# Patient Record
Sex: Female | Born: 1970 | Race: White | Hispanic: No | Marital: Married
Health system: Midwestern US, Academic
[De-identification: ages and names within clinical notes are randomized; demographics above are authoritative.]

## PROBLEM LIST (undated history)

## (undated) DIAGNOSIS — R928 Other abnormal and inconclusive findings on diagnostic imaging of breast: Secondary | ICD-10-CM

## (undated) DIAGNOSIS — N6459 Other signs and symptoms in breast: Secondary | ICD-10-CM

## (undated) DIAGNOSIS — F32A Depression, unspecified: Secondary | ICD-10-CM

## (undated) DIAGNOSIS — E559 Vitamin D deficiency, unspecified: Secondary | ICD-10-CM

## (undated) DIAGNOSIS — Z1231 Encounter for screening mammogram for malignant neoplasm of breast: Principal | ICD-10-CM

## (undated) DIAGNOSIS — F419 Anxiety disorder, unspecified: Secondary | ICD-10-CM

## (undated) DIAGNOSIS — F39 Unspecified mood [affective] disorder: Secondary | ICD-10-CM

## (undated) DIAGNOSIS — N189 Chronic kidney disease, unspecified: Secondary | ICD-10-CM

## (undated) DIAGNOSIS — C801 Malignant (primary) neoplasm, unspecified: Secondary | ICD-10-CM

## (undated) DIAGNOSIS — R011 Cardiac murmur, unspecified: Secondary | ICD-10-CM

## (undated) DIAGNOSIS — D649 Anemia, unspecified: Secondary | ICD-10-CM

## (undated) DIAGNOSIS — M1990 Unspecified osteoarthritis, unspecified site: Secondary | ICD-10-CM

## (undated) HISTORY — PX: TUBAL LIGATION: SHX77

## (undated) HISTORY — PX: WISDOM TOOTH EXTRACTION: SHX21

## (undated) HISTORY — PX: ROTATOR CUFF REPAIR: SHX139

## (undated) HISTORY — PX: COLONOSCOPY: SHX174

## (undated) HISTORY — DX: Malignant (primary) neoplasm, unspecified: C80.1

---

## 2006-02-25 NOTE — Unmapped (Signed)
Signed by   LinkLogic on 02/25/2006 at 02:20:14  Patient: [facility]  Note: All result statuses are Final unless otherwise noted.    Tests: (1)  (MR)    Order Note:                                        [facility]     PATIENT NAME:   Julie Molina, Julie Molina                   MR #:  [id number]  DATE OF BIRTH:  1970-12-03                         ACCOUNT #:  [id number]  ED PHYSICIAN:   Berneda Rose, M.D.            ROOM #:  PRIMARY:        Darden Dates, M.D.                NURSING UNIT:  ED  REFERRING:      Darden Dates, M.D.                [facility]:  C  DICTATED BY:    Berneda Rose, M.D.            ADMIT DATE:  02/25/2006  VISIT DATE:     02/25/2006                         DISCHARGE DATE:                           EMERGENCY DEPARTMENT DISCHARGE NOTE     CHIEF  COMPLAINT:  Throat swelling.     HISTORY OF PRESENT ILLNESS:    The patient is a 35 year old white female who  was at home when she suddenly developed the sensation that her throat was  swelling and closing off.  It happened about 11:30.  She keeps Benadryl in  the house and she took a swing from a Benadryl liquid bottle.  The symptoms  slowly resolved over the next 10-15 minutes.  This happened to her once two  weeks ago as well.  She did the same thing and again this has resolved.  She  comes in now quite anxious about the event but asymptomatic.  She never  developed any shortness of breath.  Her husband who is here states that she  witnessed the episode and she was very anxious and became somewhat short of  breath again she responded back to normal levels quite quickly.     PAST MEDICAL HISTORY:     1. Depression.  2. Panic attack.     CURRENT MEDICATIONS:     ALLERGIES:     SOCIAL HISTORY:  No tobacco, alcohol or drugs.     REVIEW OF SYSTEMS:  No fevers, chills, nausea, vomiting, diaphoresis,  abdominal pain, chest pain, shortness of breath sounds.     PHYSICAL EXAMINATION:     VITAL SIGNS:  Blood pressure 141/88, temperature 98.0, pulse 88,  respiration  is 18, pulse ox 97% on room air.  GENERAL:  Well-developed, well-nourished adult in no apparent distress.  HEENT:  Normocephalic and atraumatic.  External ocular muscles are intact.  Pupils equal, round and reactive to  light.  Membranes are clear.  NECK:  Supple with full range of motion.  PULMONARY:  Clear to auscultation bilaterally.  CARDIAC:  Regular rate and rhythm.  Normal S1 and S2, without murmurs,  gallops or rubs.  ABDOMEN:  Soft, nontender, nondistended.  Positive bowel sounds in all four  quadrants.  No rebound or guarding.  EXTREMITIES:  With no rash, with full strength.  Neurovascularly intact with  no neurologic abnormalities.     X-RAY:  Soft tissue neck film is normal.     MEDICAL DECISION MAKING:  It is difficult to know whether the patient is  having panic attacks that are very brief or allergic reactions. Either way  the sedation effects of the Benadryl may be helping her.  She certainly  describes an allergic reaction, although have her keep her Benadryl around  the house. I will put her on a  five day burst of prednisone.  Follow up with  Dr.  Lamar Sprinkles on Monday morning.     DIAGNOSIS:     1.  Allergic reaction versus panic attack.                                                       ________________________________________  SWW/fh                                ____  D:  02/25/2006 01:51                  Berneda Rose, M.D.  T:  02/25/2006 02:14  Job #:  [id number]                              EMERGENCY DEPARTMENT DISCHARGE NOTE                                        COPY                   PAGE    1 of 1    Note: An exclamation mark (!) indicates a result that was not dispersed into   the flowsheet.  Document Creation Date: 02/25/2006 2:20 AM  _______________________________________________________________________    (1) Order result status: Final  Collection or observation date-time: 02/25/2006 00:00  Requested date-time:   Receipt date-time:   Reported date-time:   Referring  Physician: Dema Severin  Ordering Physician:  Reviewed In [facility])  Specimen Source:   Source: DBS  Filler Order Number: [id number]  Lab site:

## 2007-02-20 NOTE — Unmapped (Signed)
Signed by Alvera Novel MA on 02/20/2007 at 11:34:19    Clinical Lists Changes    Orders:  Added new Test order of Cytology, Pap / HPV Reflex  (ThinPrep) ([id number]) ([id number]) - Signed

## 2007-02-20 NOTE — Unmapped (Signed)
Signed by Clovis Pu Carrissa Taitano MD on 02/20/2007 at 11:17:32    History of Present Illness   Chief Complaint: Annual  Age of First Menses: 10  LMP: 01/25/2007  History of Normal Pap Smears: Yes  Last Pap: Neg (04/05/2006 12:00:00 AM)  G: 6  P: 3  SAB: 3  # Living Children: 3    Current Birth Control: VAS  Current Hormone Therapy: no    Annual Visit   Character: normal  Duration of flow: 5-6 day(s)  Interval: 30 days    Current Problems   Patient complains of: none  Patient denies: abnormal bleeding, breast problems, dysmenorrhea, pelvic pain, vaginal discharge, vulvar pain, sexual problems    Currently Sexually Active: Yes  Calcium Supplement: No  Vitamin D Supplement: No  Multivitamin: No  Family History of Osteoporosis: No  Exercise: Yes  Type: Walk   Minutes per Session: 30-45 minutes  Times per Week: x,s 3  Has Patient been threatened, abused or hurt by anyone? No    Allergies  No Known Allergies    Medications:   LEXAPRO 20 MG TABS (ESCITALOPRAM OXALATE) 1 by mouth qd  VERAMYST 27.5 MCG/SPRAY SUSP (FLUTICASONE FUROATE) 2 puffs in each nostril qd        Vital Signs    Weight: 191 lbs.     Blood Pressure   BP #1: 108 / 66mm Hg     Intake recorded by: Alvera Novel MA  February 20, 2007 10:46 AM      Past History  Past Medical History:  Onset of First Menses at Age: 80, Gravida: 6, Para: 3, Miscarriage(s): 3, # Vaginal Deliveries: 2 VBAC, Anxiety, Depression  Surgical History:  R Ear- Bone Spur removed 1998, Cesarean Section: 1997    Family History: Mother - Lung Cancer, CHF, DIED 07-11-2022 SEPSIS FROM INFECTED HEART VALVE  Brother - DIED 11/09/22 SUBSTANCE ABUSE, END STAGE CIRRHOSIS  MGM - Heart Disease  MGF - Heart Disease  Social History: Marital Status: married 15 years,   Children: 4,   Employment Status: homemaker,   Patient Lives at: home,   Support System: excellent  Exercise: Walk 30-45 minutes 3 x's a week.,   Caffeine per Day: 4  Seatbelt Use: 100 % of the time  Alcohol Use: none  Drug Use: none  MOTHER DIED Jul 11, 2022  SEPSIS  BROTHER DIED 01/09/23 SUBSTANCE ABUSE  FATHER IN LAW DIED 01-09-2023  Sexually Active: Yes        PHYSICAL EXAMINATION  Constitutional: alert, no acute distress, well hydrated, well developed, well nourished;    External Genitalia:  no lesions, normal estrogen effect, normal hair pattern, no clitorimegaly, no erythema, normal perineal body;    Urethra:  normal appearance, no erythema, no tenderness, normal urethral meatus;    Vagina:  no discharge, no lesions, no masses, no cystocele, no rectocele, normal support, normal rugations;    Cervix:  present, no lesions, no discharge, no cervical motion tenderness;    Uterus:  uterus midline, normal size, non-tender, regular contour, mobile;    Adnexa:  no masses, no tenderness;          Assessment   WELL WOMAN EXAM  NO COMPLATINS. NORMAL CYCLES      Plan   1-PAP  2-RTN 1 YR  Problems  ANNUAL GYNECOLOGICAL EXAMINATION (ICD-V72.31)                     ]

## 2007-10-08 ENCOUNTER — Ambulatory Visit: Payer: Self-pay | Admitting: Internal Medicine

## 2007-10-16 ENCOUNTER — Ambulatory Visit: Payer: Self-pay | Admitting: Internal Medicine

## 2008-02-26 NOTE — Unmapped (Signed)
Signed by Clovis Pu Kamauri Kathol MD on 03/03/2008 at 17:38:41  Patient: Julie Molina  Note: All result statuses are Final unless otherwise noted.  Patient Note: [id number]  Patient Note: Source............Marland KitchenEndocervical  Patient Note: LMP / Prev Treat.Marland KitchenMarland KitchenAYT=[id number]  Patient Note: No. of containers.Marland Kitchen01 CYTYC Thin Prep Vial  Patient Note: Clinical Information: Cervical/ Endocervical / V aginal/   Supracervical/Hyst erectomy    Tests: (1) Pap IG, rfx HPV ASCU ([id number])  ! Clinician provided ICD9:                              See report      V72.31 ; Routine gynecological examination            PERFORMED BY: Modena Jansky, Cytotechnologist (ASCP)          Interpretation            NIL      NIL      NEGATIVE FOR INTRAEPITHELIAL LESION AND MALIGNANCY.        ! DIAGNOSIS/CATEGORY        NIL      NIL      Negative for Intraepithelial Lesion        ! Adequacy:                 ENDO,PCOIR      ENDO,PCOIR      Satisfactory for evaluation.  Endocervical and/or squamous metaplastic      cells (endocervical component) are present.      Areas of partially obscuring exudate are present.        ! Note:                     PAPSMR      The Pap smear is a screening test designed to aid in the detection of      premalignant and malignant conditions of the uterine cervix.  It is not a      diagnostic procedure and should not be used as the sole means of detecting      cervical cancer.  Both false-positive and false-negative reports do occur.        ! Test Methodology:         IGLPAP      This liquid based ThinPrep(R) pap test was screened with the      use of an image guided system.            The HPV DNA reflex criteria were not met with this specimen result      therefore, no HPV testing was performed.          Note: An exclamation mark (!) indicates a result that was not dispersed into   the flowsheet.  Document Creation Date: 02/29/2008 2:23 PM  _______________________________________________________________________    (1) Order result  status: Final  Collection or observation date-time: 02/26/2008 12:09  Requested date-time:   Receipt date-time: 02/26/2008 18:35  Reported date-time: 02/29/2008 14:11  Referring Physician:    Ordering Physician: T Darcey Cardy (dehoopta)  Specimen Source:   Source: Leverne Humbles Order Number: [id number] LAB  Lab site: Performed At:  [facility] [address] [place],   [place] [id number] A.Clearnce Sorrel MD     Phone:  [phone]

## 2008-02-26 NOTE — Unmapped (Signed)
Signed by Clovis Pu Najai Waszak MD on 02/26/2008 at 11:22:06      Reason for Visit   Chief Complaint: Annual Exam  History from: patient    Allergies  No Known Allergies  Vital Signs Height: 62.5 in.    Weight: 194 lbs.     BMI (in-lb): 35.04   BSA (m2): 1.90    Blood Pressure   BP #1: 132 / 80mm Hg  Cuff Size: Std   Intake recorded by: Percell Locus MA on February 26, 2008 10:30 AM               Annual Visit   Duration of flow: 5 day(s)  Interval: 28 days    Current Problems   Patient denies: abnormal bleeding, breast problems, dysmenorrhea, pelvic pain, vaginal discharge, vulvar pain, sexual problems    Currently Sexually Active: Yes  Partners in Last 12 Months: 1  Calcium Supplement: No  Vitamin D Supplement: No  Multivitamin: No  History of DVT's/VTE's: No  Family History of Osteoporosis: Yes - mother  Exercise: Yes  Type: walk   Minutes per Session: 30  Times per Week: 3  Has Patient been threatened, abused or hurt by anyone? No  History of Present Illness   Chief Complaint: Annual Exam  Age of First Menses: 10  LMP: 02/08/2008    Last Pap: Neg (02/20/2007 12:00:00 AM)  G: 6  P 3:     # Living Children: 3  SAB: 3  Current Birth Control: VAS  Current Hormone Therapy: no    Past History  Past Medical History (reviewed - no changes required):  Onset of First Menses at Age: 1, Gravida: 6, Para: 3, Miscarriage(s): 3, # Vaginal Deliveries: 2 VBAC, Anxiety, Depression  Surgical History (reviewed - no changes required):  R Ear- Bone Spur removed 1998, Cesarean Section: 1997    Family History (reviewed - no changes required): Mother - Lung Cancer, CHF, DIED 07/12/2022 SEPSIS FROM INFECTED HEART VALVE  Brother - DIED 2022/11/10 SUBSTANCE ABUSE, END STAGE CIRRHOSIS  MGM - Heart Disease  MGF - Heart Disease  Social History (reviewed - no changes required): Marital Status: married 15 years,   Children: 4,   Employment Status: homemaker,   Patient Lives at: home,   Support System: excellent  Exercise: Walk 30-45 minutes 3 x's a week.,    Caffeine per Day: 4  Seatbelt Use: 100 % of the time  Alcohol Use: none  Drug Use: none  MOTHER DIED 2022-07-12 SEPSIS  BROTHER DIED 01/10/23 SUBSTANCE ABUSE  FATHER IN LAW DIED Jan 10, 2023  Sexually Active: Yes        Preventive Maintenance           Physical Examination  Constitutional: alert, no acute distress, well hydrated, well developed, well nourished;    Breast:  breast symmetrical, no tenderness, no dimpling, no masses, no erythema, no axillary adenopathy, normal aerola bilaterally, no nipple discharge, no inversion;    Abdomen: nondistended, nontender, no guarding, no masses, no organomegaly, no abdominal hernias, no suprapubic tenderness;    External Genitalia:  no lesions, normal estrogen effect, normal hair pattern, no clitorimegaly, no erythema, normal perineal body;    Urethra:  normal appearance, no erythema, no tenderness, normal urethral meatus;    Vagina:  no discharge, no lesions, no masses, no cystocele, no rectocele, normal support, normal rugations;    Cervix:  present, no lesions, no discharge, no cervical motion tenderness;    Uterus:  uterus midline, normal size, non-tender,  regular contour, anteverted, mobile;    Adnexa:  no masses, no tenderness, non palpable ovaries;        Assessment   37 year old Female     G: 6  P 3:     # Living Children: 3  SAB: 3    Status of Existing Problems  Assessed ANNUAL GYNECOLOGICAL EXAMINATION as unchanged - Normal cycles.  No gyn complaints.    1- Pap.  Pt instructed to call the [facility] Pap Test Patient Information Service for results.  Informed we could call for any abnormal results.  2- Return 1 year - Clovis Pu Tammra Pressman MD

## 2008-02-26 NOTE — Unmapped (Signed)
Signed by Percell Locus MA on 02/26/2008 at 12:11:09    Clinical Lists Changes    Orders:  Added new Test order of Cytology, Pap / HPV Reflex  (ThinPrep) ([id number]) ([id number]) - Signed      Process Orders  Check Orders Results:      EMR Link Lab: ABN not required for this insurance  Requisition page opened for [facility]. Order transmitted February 26, 2008 12:09 PM    Tests Sent for requisitioning:      02/26/2008: EMR Link Lab -- Cytology, Pap / HPV Reflex  (ThinPrep) [phone]) [[id number]] (signed)

## 2008-06-20 ENCOUNTER — Observation Stay: Payer: Self-pay

## 2008-06-26 ENCOUNTER — Inpatient Hospital Stay: Payer: Self-pay

## 2009-03-24 NOTE — Unmapped (Signed)
Signed by Clovis Pu Kwinton Maahs MD on 03/31/2009 at 14:33:19  Patient: Julie Molina  Note: All result statuses are Final unless otherwise noted.    Tests: (1) Pap (TP) Screen - Image Guided, Reflex and HPV Ascus ([id number])  ! Path-Nature of Specimen                              NOTES      .  SPECIMEN(S): Cervical / Endocervical  ! Path-Clinical History                              NOTES      .  CLINICAL HISTORY: Previous Pap showing Office Info ICD-V72.31, PREVIOUS PAP  NORMAL, LMP 03/18/09    Path-Diagnosis            Neg      .  INTERPRETATION:  NEGATIVE FOR INTRAEPITHELIAL LESION OR MALIGNANCY  ! Path-Diagnosis            NOTES      .  SPECIMEN ADEQUACY:  Satisfactory for evaluation with endocervical/transformation zone component  present  ! Path-Diagnosis            NOTES      .  NOTES:  This liquid based pap test was evaluated with the assistance of the  ThinPrep Pap Test Imaging System.  The HPV DNA reflex criteria were not met with this specimen result  therefore, no HPV testing was performed.  ! Path-Diagnosis            NOTES      .  Performing Location:  [address] [place], [place] [address]  ! SIGNATURE                 NOTES      .  Diagnostician:  CHARLENE HINES  Cytotechnologist  Electronically Signed 03/27/2009  ! PRIOR PAP                 NOTES      .  PRIOR PAP SMEAR DIAGNOSES: Only the 3 most recent reports are included.  Fewer reports may be available for some patients.  No cases found.    Note: An exclamation mark (!) indicates a result that was not dispersed into   the flowsheet.  Document Creation Date: 03/27/2009 11:22 AM  _______________________________________________________________________    (1) Order result status: Final  Collection or observation date-time: 03/24/2009 00:00  Requested date-time: 03/24/2009 00:00  Receipt date-time: 03/24/2009 00:00  Reported date-time:   Referring Physician:    Ordering Physician:  Thalia Bloodgood, (dehoopta)  Specimen Source:   Source: Leverne Humbles Order Number:  [id number])  Lab site:

## 2009-03-24 NOTE — Unmapped (Signed)
Signed by Percell Locus MA on 03/24/2009 at 10:01:47    Clinical Lists Changes    Orders:  Added new Test order of Cytology, Pap / HPV Reflex  (ThinPrep) ([id number]) ([id number]) - Signed      Process Orders  Check Orders Results:      EMR Link Lab: ABN not required for this insurance  Not all tests in this order have been sent for requisitioning  Pending tests not yet sent for requisitioning:      03/24/2009: EMR Link Lab -- Cytology, Pap / HPV Reflex  (ThinPrep) ([id number]) [[id number]] (signed)

## 2009-03-24 NOTE — Unmapped (Signed)
Signed by Julie Pu Jamas Jaquay MD on 03/24/2009 at 09:57:54      Reason for Visit   Chief Complaint: Annual Exam  History from: patient    Allergies  No Known Allergies    Vital Signs  Height: 62.5 in.   Weight: 195 lbs.     BMI (in-lb): 35.22   BSA (m2): 1.90    Blood Pressure   BP #1: 120 / 80mm Hg  Cuff Size: Std   Intake recorded by: Percell Locus MA on March 24, 2009 9:08 AM               Annual Visit   Duration of flow: 3a day(s)  Interval: monthly    Current Problems   Patient denies: abnormal bleeding, breast problems, dysmenorrhea, pelvic pain, vaginal discharge, vulvar pain, sexual problems    Currently Sexually Active: Yes  Sexual Partners are: Men  Calcium Supplement: No  Vitamin D Supplement: No  Multivitamin: No  History of DVT's/VTE's: No  Family History of Osteoporosis: Yes - mother  Exercise: No  Has Patient been threatened, abused or hurt by anyone? No  History of Present Illness   Chief Complaint: Annual Exam  Age of First Menses: 10  LMP: 03/18/2009    Last Pap: NIL (02/26/2008 12:09:00 PM)  Mammogram: Normal   Date: 03/13/2000  G: 6  P 3:     # Living Children: 3  SAB: 3  Current Birth Control: VAS  Current Hormone Therapy: no    PAST HISTORY  Past Medical History (reviewed - no changes required):  Onset of First Menses at Age: 69, Gravida: 6, Para: 3, Miscarriage(s): 3, # Vaginal Deliveries: 2 VBAC, Anxiety, Depression  Surgical History (reviewed - no changes required):  R Ear- Bone Spur removed 1998, Cesarean Section: 1997    Family History (reviewed - no changes required): Mother - Lung Cancer, CHF, DIED Jul 12, 2022 SEPSIS FROM INFECTED HEART VALVE  Brother - DIED 2022-11-10 SUBSTANCE ABUSE, END STAGE CIRRHOSIS  MGM - Heart Disease  MGF - Heart Disease  Social History (reviewed - no changes required): Marital Status: married 15 years,   Children: 4,   Employment Status: homemaker,   Patient Lives at: home,   Support System: excellent  Exercise: Walk 30-45 minutes 3 x's a week.,   Caffeine per Day: 4  Seatbelt  Use: 100 % of the time  Alcohol Use: none  Drug Use: none  MOTHER DIED 2022/07/12 SEPSIS  BROTHER DIED 01-10-23 SUBSTANCE ABUSE  FATHER IN LAW DIED Jan 10, 2023  Sexually Active: Yes        Preventive Maintenance           Physical Examination  Constitutional: alert, no acute distress, well hydrated, well developed, well nourished;    Breast:  breast symmetrical, no tenderness, no dimpling, no masses, no erythema, no axillary adenopathy, normal aerola bilaterally, no nipple discharge, no inversion;    Abdomen: nondistended, nontender, no guarding, no masses, no organomegaly, no abdominal hernias, no suprapubic tenderness;    External Genitalia:  no lesions, normal estrogen effect, normal hair pattern, no clitorimegaly, no erythema, normal perineal body;    Urethra:  normal appearance, no erythema, no tenderness, normal urethral meatus;    Vagina:  no discharge, no lesions, no masses, no cystocele, no rectocele, normal support, normal rugations;    Cervix:  present, no lesions, no discharge, no cervical motion tenderness;    Uterus:  uterus midline, normal size, non-tender, regular contour, mobile;  exam limited by body  habitus or patient cooperation  .    Adnexa:  no masses, no tenderness;        Assessment   38 year old Female     G: 6  P 3:     # Living Children: 3  SAB: 3    Status of Existing Problems  Assessed ANNUAL GYNECOLOGICAL EXAMINATION as unchanged - normal exam.  Normal cycles without break through bleeding.  No other gyn complains.    1- Pap - Julie Pu Jelina Paulsen MD

## 2009-03-31 NOTE — Unmapped (Addendum)
Signed by Berna Spare MD on 03/31/2009 at 14:34:19             [facility]                   [facility]     [facility]  [address], [address]    [address], [address]  [place], [place] [address]      [place], [place] [address]  [phone]       [phone]  [phone] Fax      [phone] Fax        March 31, 2009        [facility]  [address]    [place], [place] [address]          RE:  TEST RESULTS    Dear Ms. Loud:      The following is an interpretation of your most recent tests.  Please take note of any instructions provided.         Your recent Pap smear result has come back and is normal.      If you have any questions, please don't hesitate to call the office.  Please remember to make an appointment this time next year for your annual exam.    Good to see you again and hear about the kids.  Kiss each one for me.      Sincerely,      Berna Spare, MD, FACOG  Associate Professor    Signed by Percell Locus MA on 04/01/2009 at 08:09:31    letter mailed  Signed by Percell Locus MA on 04/01/2009 at 08:15:01    letter mailed

## 2009-04-06 ENCOUNTER — Ambulatory Visit: Payer: Self-pay | Admitting: Internal Medicine

## 2009-09-22 ENCOUNTER — Ambulatory Visit: Payer: Self-pay | Admitting: Internal Medicine

## 2009-12-01 ENCOUNTER — Ambulatory Visit: Payer: Self-pay | Admitting: Unknown Physician Specialty

## 2010-03-30 NOTE — Unmapped (Signed)
Signed by Ella Jubilee MD on 03/30/2010 at 09:46:01      Reason for Visit   Chief Complaint: annual  History from: patient    Allergies  No Known Allergies    Medications  LEXAPRO 20 MG TABS (ESCITALOPRAM OXALATE) 1/2 by mouth         Vital Signs  Height: 62.5 in.   Weight: 193 lbs.     BMI (in-lb): 34.86     Blood Pressure   BP #1: 118 / 76mm Hg  Cuff Size: Std     Pain:     Have you had pain other than everyday aches and pains (e.g., mild headache, back ache, strains) in the past week?   No    Intake recorded by: Sharmon Revere MA on March 30, 2010 8:54 AM             Medications reviewed, updated and verified with patient or patient representative.  Women (Any Age) or Men (over Age 65): nondrinker      Annual Visit   Character: normal  Duration of flow: 6 day(s)  Interval: 28 days    Current Problems   Patient denies: abnormal bleeding, breast problems, dysmenorrhea, pelvic pain, vaginal discharge, vulvar pain, sexual problems    Currently Sexually Active: Yes  Sexual Partners are: Men  Calcium Supplement: No  Vitamin D Supplement: No  Multivitamin: No  History of DVT's/VTE's: No  Family History of Osteoporosis: Yes - mother  Exercise: No  Has Patient been threatened, abused or hurt by anyone? No  History of Present Illness   Chief Complaint: annual  Age of First Menses: 10  LMP: 03/06/2010  History of Abnormal Paps: No  Last Pap: Neg (03/24/2009 12:00:00 AM)  G: 6  P 3:     # Living Children: 3  SAB: 3  Current Birth Control: VAS  Current Hormone Therapy: no    39yo here for annual exam  patient without complaints        PAST HISTORY  Past Medical History (reviewed - no changes required):  Onset of First Menses at Age: 19, Gravida: 6, Para: 3, Miscarriage(s): 3, # Vaginal Deliveries: 2 VBAC, Anxiety, Depression  Surgical History (reviewed - no changes required):  R Ear- Bone Spur removed 1998, Cesarean Section: 1997    Family History (reviewed - no changes required): Mother - Lung Cancer, CHF, DIED 2022-06-22  SEPSIS FROM INFECTED HEART VALVE  Brother - DIED Oct 21, 2022 SUBSTANCE ABUSE, END STAGE CIRRHOSIS  MGM - Heart Disease  MGF - Heart Disease  Social History (reviewed - no changes required): Marital Status: married 15 years,   Children: 4,   Employment Status: homemaker,   Patient Lives at: home,   Support System: excellent  Exercise: Walk 30-45 minutes 3 x's a week.,   Caffeine per Day: 4  Seatbelt Use: 100 % of the time  Alcohol Use: none  Drug Use: none  MOTHER DIED Jun 22, 2022 SEPSIS  BROTHER DIED 12-21-22 SUBSTANCE ABUSE  FATHER IN LAW DIED 21-Dec-2022  Sexually Active: Yes        Preventive Maintenance         Review of Systems  General: Denies fevers, chills, sweats, anorexia, fatigue, malaise, weight loss, weight gain.   Cardiovascular: Denies chest pains, dizziness, dyspnea on exertion, dyspnea at rest, palpitations.   Gastrointestinal: Denies nausea, vomiting, diarrhea, constipation, change in bowel habits, abdominal pain.   Genitourinary: Denies vaginal discharge, incontinence, day time wetting, dysuria, hematuria, urinary frequency, amenorrhea, menorrhagia,  abnormal vaginal bleeding, pelvic pain, nocturia, flank pain, weak stream, precipitance, incomplete empty, retention, enuresis, perineal rash, tea-colored urine.   Neurologic: Denies headache, transient paralysis, weakness, paresthesias, seizures, syncope, tremors, vertigo, poor coordination.     Physical Examination  Constitutional: alert, no acute distress, well hydrated, well developed, well nourished;    Skin: normal color, no rashes, no lesions, warm to touch;    Head: atraumatic, normocephalic;    Eyes: pupils equal, no injection, no icterus;    Ears/Nose/Throat: external ears normal, hearing normal, external nose normal;    Neck: supple, no adenopathy, no masses, no thyromegaly;    Chest: no apparent respiratory distress, normal chest inspection, clear to auscultation, normal breath sounds bilaterally;    Breast:  breast symmetrical, no tenderness, no dimpling, no masses,  no erythema, no axillary adenopathy, normal aerola bilaterally, no nipple discharge, no inversion;    Cardiac:  normal S1 and S2, regular rate and rhythm, no murmurs, no gallops, no rubs;    Abdomen: nondistended, nontender, no guarding, no masses, no organomegaly, no abdominal hernias, no suprapubic tenderness;    External Genitalia:  no lesions, normal estrogen effect, normal hair pattern, no clitorimegaly, no erythema, normal perineal body;    Urethra:  normal appearance, no erythema, no tenderness, normal urethral meatus;    Vagina:  no discharge, no lesions, no masses, no cystocele, no rectocele, normal support, normal rugations;    Cervix:  present, no lesions, no discharge, no cervical motion tenderness, Nabothian cyst(s);    Uterus:  uterus midline, normal size, non-tender, regular contour, mobile;    Adnexa:  no masses, no tenderness;    Lymphatic: no cervical adenopathy, no supraclavicular adenopathy, no axillary adenopathy, no inguinal adenopathy, no femoral adenopathy, no enlargement;    Back: no deformities, normal spine, no cva tenderness;    Extremities: no edema, no lesions;    Neuro: normal mental status;    Psych: oriented to time; place; and person, affect and mood appropriate;        Assessment   39 year old Female     G: 6  P 3:     # Living Children: 3  SAB: 3    Status of Existing Problems  Assessed ANNUAL GYNECOLOGICAL EXAMINATION as unchanged - updated  mmg  pap - Ella Jubilee MD    Today's Orders   Mammogram, Screening Bilateral [[id number]-76092]  Cytology, Pap / HPV Reflex / GC / Chlamydia (ThinPrep) (81191) [[id number]]    Assessment  39yo P3 with annual exam    Plan   pap  mmg    Disposition:     Clinic: [facility]                Process Orders  Check Orders Results:      EMR Link Lab: ABN not required for this insurance  Tests Sent for requisitioning (March 30, 2010 9:46 AM):      03/30/2010: EMR Link [facility] -- Cytology, Pap / HPV Reflex / GC / Chlamydia (ThinPrep) ([id number]) [[id number]] (signed)    ]

## 2010-04-05 NOTE — Unmapped (Addendum)
Signed by Ella Jubilee MD on 04/05/2010 at 15:56:35                 [facility]         Obstetrics and Gynecology          [address]         [place], [place] [address]         p [phone] f [phone]         [email or web address]        April 05, 2010        [facility]  [address]    [place], [place] [address]      RE:  TEST RESULTS    Dear Ms. Berrett:      The following is an interpretation of your most recent tests.  Please take note of any instructions provided.    Pap Smear: normal       GC/Chlamydia results negative.      It was great to meet you the other day.  Thanks for coming to see me.  If you have any questions or need anything else, please call.        Sincerely,        Deirdre Peer. Rosanne Gutting, MD  Assistant Professor    Signed by Sharmon Revere MA on 04/06/2010 at 09:11:10    mailed out to patient

## 2010-10-04 NOTE — Unmapped (Signed)
Signed by Sharmon Revere MA on 10/04/2010 at 15:20:39                 [facility]         Obstetrics and Gynecology          [address]         [place], [place] [address]         p [phone] f [phone]         [email or web address]        October 04, 2010      [facility]  [address]  [place], [place]  [address]      Dear  Ms. Julie Molina,    This letter is a reminder that you are PAST DUE for routine tests (mammogram ) that were ordered by your doctor.  If you have already had these tests performed and/or have not received the results, Please contact our office.    Please call if you have any questions.        [name] MA / Dr. Rosanne Gutting

## 2010-11-24 NOTE — Unmapped (Signed)
Signed by Sharmon Revere MA on 11/24/2010 at 10:40:31                 [facility]         Obstetrics and Gynecology          [address]         [place], [place] [address]         p [phone] f [phone]         [email or web address]        November 24, 2010      [facility]  [address]  [place], [place]  [address]      Dear  Ms. Julie Molina,    This letter is a reminder that you are PAST DUE for routine tests(mammogram) that were ordered by your doctor.  If you have already had these tests performed and/or have not received the results, Please contact our office.    Please call if you have any questions.      [name] MA

## 2011-03-28 ENCOUNTER — Encounter: Payer: Self-pay | Admitting: Rheumatology

## 2011-04-14 ENCOUNTER — Encounter: Payer: Self-pay | Admitting: Rheumatology

## 2011-06-02 ENCOUNTER — Ambulatory Visit: Payer: Self-pay | Admitting: Internal Medicine

## 2011-07-15 ENCOUNTER — Encounter

## 2011-08-24 MED ORDER — MOMETASONE FUROATE 50 MCG/ACT NA SUSP
50 MCG/ACT | Freq: Every day | NASAL | Status: DC
Start: 2011-08-24 — End: 2012-07-25

## 2011-08-24 NOTE — Progress Notes (Signed)
[facility] Internal [facility] Progress Note    HPI: Bonnie Mitchell is a 41 y.o. year old female.  she is here following up for:       Reason For Visit:  Chief Complaint   Patient presents with   . 6 Month Follow-Up     follow up from last visit       Review of Systems :    Constitutional: Negative.    HENT: Negative for hearing loss, ear pain and neck pain.    Cardiovascular: Negative.    Gastrointestinal: Negative.    Genitourinary: Negative.    Neurological: Negative.    Hematological: Negative.    All other systems reviewed and are negative.    Past Medical History   Diagnosis Date   . Depression    . Hypercholesteremia    . Adjustment disorder 08/24/2011     Foster child sent to [facility] w/ derelict step grandfather 11/12   . 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 Annie Main MD Derm   . GERD (gastroesophageal reflux disease) 08/24/2011     History   Substance Use Topics   . Smoking status: Never Smoker    . Smokeless tobacco: Not on file   . Alcohol Use: Not on file       General:     Obese: No    Pain Assessment:    Pain Present: no  Diet: Yes  Exercise: Yes      Current Outpatient Prescriptions   Medication Sig Dispense Refill   . escitalopram (LEXAPRO) 10 MG tablet Take 10 mg by mouth daily.       . Cholecalciferol (VITAMIN D) 2000 UNITS CAPS capsule Take  by mouth.       . calcium carbonate 600 MG TABS tablet Take 1 tablet by mouth 2 times daily.         No current facility-administered medications for this visit.       Physical Exam   Vitals reviewed.  Constitutional: appears well-nourished.   HENT:   Carotid bruits: No  Lymphadenopathy: No adenopathy  Thyroid-No masses or nodules  Eyes: PERRLA, EOMI   LUNGS:  No increased work of breathing, good air exchange, clear to auscultation bilaterally, no crackles or wheezing  CARDIOVASCULAR: regular rate and rhythm, S1, S2 normal, no murmur, click, rub or gallop  ABDOMEN:  soft, non-tender, without masses or organomegaly  Musculoskeletal: No bony deformity  Neurological: No  lateralizing motor deficits  Skin: No suspicious pigmented lesions  Psychiatric: Normal affect and mood  Examined by: Darden Dates, M.D., Stana Bunting, ANP-BC    Plan:  Patient Active Problem List   Diagnosis   . Hypercholesteremia   . Adjustment disorder coping well since 11/12   . Atypical nevi Derm F/U Dr. Annie Main   . GERD (gastroesophageal reflux disease)   . Rhinitis nasal steroids resume & stay on

## 2011-09-02 ENCOUNTER — Encounter

## 2011-09-05 MED ORDER — ESCITALOPRAM OXALATE 20 MG PO TABS
20 MG | ORAL_TABLET | Freq: Every day | ORAL | Status: DC
Start: 2011-09-05 — End: 2012-05-14

## 2011-09-05 NOTE — Telephone Encounter (Signed)
Sent in lexapro rx per Dr. Lamar Sprinkles

## 2011-09-13 ENCOUNTER — Encounter

## 2011-09-21 ENCOUNTER — Ambulatory Visit: Admit: 2011-09-21 | Discharge: 2011-09-21 | Payer: PRIVATE HEALTH INSURANCE

## 2011-09-21 DIAGNOSIS — Z01419 Encounter for gynecological examination (general) (routine) without abnormal findings: Secondary | ICD-10-CM

## 2011-09-21 NOTE — Unmapped (Signed)
Subjective:       Patient ID: Julie Molina is a 41 y.o. female.    HPI Comments: Here for annual exam  No complaints  Pap done 2011  MMG done in Feb and was normal per her report  Menstrual History  Period Cycle (Days): 28   Period Duration (Days): 5  Period Pattern: Regular  Menstrual Flow: Moderate  Dysmenorrhea: None        The following portions of the patient's history were reviewed and updated as appropriate:   She  has a past medical history of Anxiety and Depression.  She  does not have any pertinent problems on file.  She  has past surgical history that includes Cesarean section (1997) and ear spur (1998).  Her family history includes Cancer in her mother and Heart disease in her maternal grandfather and maternal grandmother.  She  reports that she has never smoked. She does not have any smokeless tobacco history on file. She reports that she drinks alcohol. She reports that she does not use illicit drugs.  She has a current medication list which includes the following prescription(s): escitalopram.  No current outpatient prescriptions on file prior to visit.     She  has no known allergies..    Review of Systems   Constitutional: Negative for fever, chills, appetite change, fatigue and unexpected weight change.   Respiratory: Negative for cough, choking, chest tightness, shortness of breath and wheezing.    Cardiovascular: Negative for chest pain, palpitations and leg swelling.   Gastrointestinal: Negative for nausea, vomiting, abdominal pain, diarrhea and constipation.   Genitourinary: Negative for dysuria, frequency, vaginal bleeding, vaginal discharge, difficulty urinating, vaginal pain and pelvic pain.   Neurological: Negative for dizziness, tremors, seizures, numbness and headaches.           Objective:    Physical Exam   Nursing note and vitals reviewed.  Constitutional: She is oriented to person, place, and time. She appears well-developed and well-nourished. No distress.   HENT:   Head:  Normocephalic and atraumatic.   Right Ear: External ear normal.   Left Ear: External ear normal.   Nose: Nose normal.   Eyes: Conjunctivae and EOM are normal. Right eye exhibits no discharge. Left eye exhibits no discharge. No scleral icterus.   Neck: Normal range of motion. Neck supple. No tracheal deviation present. No thyromegaly present.   Cardiovascular: Normal rate and regular rhythm.  Exam reveals no gallop and no friction rub.    No murmur heard.  Pulmonary/Chest: Effort normal and breath sounds normal. No stridor. No respiratory distress. She has no wheezes. She has no rales. She exhibits no tenderness. Right breast exhibits no inverted nipple, no mass, no nipple discharge, no skin change and no tenderness. Left breast exhibits no inverted nipple, no mass, no nipple discharge, no skin change and no tenderness. Breasts are symmetrical.   Abdominal: Soft. Bowel sounds are normal. She exhibits no distension and no mass. There is no tenderness. There is no rebound and no guarding.   Genitourinary: Vagina normal and uterus normal. No breast swelling, tenderness, discharge or bleeding. Pelvic exam was performed with patient supine. No labial fusion. There is no rash, tenderness, lesion or injury on the right labia. There is no rash, tenderness, lesion or injury on the left labia. Uterus is not deviated, not enlarged, not fixed and not tender. Cervix exhibits no motion tenderness, no discharge and no friability. Right adnexum displays no mass, no tenderness and no fullness. Left  adnexum displays no mass, no tenderness and no fullness. No erythema, tenderness or bleeding around the vagina. No foreign body around the vagina. No signs of injury around the vagina. No vaginal discharge found.   Musculoskeletal: Normal range of motion. She exhibits no edema.   Lymphadenopathy:     She has no cervical adenopathy.        Right: No inguinal adenopathy present.        Left: No inguinal adenopathy present.   Neurological:  She is alert and oriented to person, place, and time. Coordination normal.   Skin: Skin is warm and dry. No rash noted. She is not diaphoretic. No erythema.   Psychiatric: She has a normal mood and affect. Her behavior is normal. Judgment and thought content normal.           Assessment:       Normal annual exam      Plan:       1. Routine gynecological examination

## 2012-05-14 MED ORDER — ESCITALOPRAM OXALATE 20 MG PO TABS
20 MG | ORAL_TABLET | Freq: Every day | ORAL | Status: DC
Start: 2012-05-14 — End: 2012-05-31

## 2012-05-31 MED ORDER — ESCITALOPRAM OXALATE 10 MG PO TABS
10 MG | ORAL_TABLET | Freq: Every day | ORAL | Status: DC
Start: 2012-05-31 — End: 2012-05-31

## 2012-05-31 MED ORDER — AZITHROMYCIN 500 MG PO TABS
500 MG | ORAL_TABLET | ORAL | Status: DC
Start: 2012-05-31 — End: 2012-07-25

## 2012-05-31 MED ORDER — LEXAPRO 10 MG PO TABS
10 MG | ORAL_TABLET | Freq: Every day | ORAL | Status: DC
Start: 2012-05-31 — End: 2012-10-05

## 2012-05-31 NOTE — Progress Notes (Signed)
[facility] Internal Medicine Progress Note    HPI: Bonnie Mitchell is a 41 y.o. year old female.  she is here following up for:       Reason For Visit:  Chief Complaint   Patient presents with   ??? 6 Month Follow-Up     for GERD & mood issues w/related medications   ??? Discuss Medications     generic lexapro is causing side effects: throat tightness. Try brand Lexapro 1/2 of a 10mg  tab.   ??? Flu Vaccine     would like to receive today, has low grade temp       Review of Systems :    Constitutional: Negative.    HENT: Negative for hearing loss, ear pain and neck pain.    Cardiovascular: Negative.    Gastrointestinal: Negative.    Genitourinary: Negative.    Neurological: Negative.    Hematological: Negative.    All other systems reviewed and are negative.    Past Medical History   Diagnosis Date   ??? Depression 2007   ??? Hypercholesteremia    ??? Adjustment disorder 08/24/2011     Foster child sent to [facility] w/ derelict step grandfather 11/12   ??? 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 Ede MD Derm   ??? GERD (gastroesophageal reflux disease) 08/24/2011   ??? Posterior vitreous detachment, both eyes 2004     Dr. Elisabeth Pigeon   ??? OSA (obstructive sleep apnea) 05/31/2012     History   Substance Use Topics   ??? Smoking status: Never Smoker    ??? Smokeless tobacco: Never Used      Comment: Not needed   ??? Alcohol Use: No       General:     Obese: No    Pain Assessment:    Pain Present: no  Diet: Yes  Exercise: Yes      Current Outpatient Prescriptions   Medication Sig Dispense Refill   ??? LEXAPRO 10 MG tablet Take 1 tablet by mouth daily. For mood.  30 tablet  12   ??? azithromycin (ZITHROMAX) 500 MG tablet 1 daily till gone for sinus/ bronchitis.  6 tablet  2   ??? mometasone (NASONEX) 50 MCG/ACT nasal spray 2 sprays by Nasal route daily. For nasal & ear allergy symptoms.  1 Inhaler  11   ??? Cholecalciferol (VITAMIN D) 2000 UNITS CAPS capsule Take 1 capsule by mouth daily.       ??? calcium carbonate 600 MG TABS tablet Take 1 tablet by mouth 2 times daily.          No current facility-administered medications for this visit.       Physical Exam   Vitals reviewed.  Constitutional: appears well-nourished.   HENT:   Carotid bruits: No  Lymphadenopathy: No adenopathy  Thyroid-No masses or nodules  Eyes: PERRLA, EOMI   LUNGS:  No increased work of breathing, good air exchange, clear to auscultation bilaterally, no crackles or wheezing  CARDIOVASCULAR: regular rate and rhythm, S1, S2 normal, no murmur, click, rub or gallop  ABDOMEN:  soft, non-tender, without masses or organomegaly  Musculoskeletal: No bony deformity  Neurological: No lateralizing motor deficits  Skin: No suspicious pigmented lesions  Psychiatric: Normal affect and mood  Examined by: Darden Dates, M.D., Stana Bunting, ANP-BC    Plan:  Patient Active Problem List   Diagnosis   ??? Hypercholesteremia   ??? Adjustment disorder coping well since 11/12   ??? Atypical nevi Derm F/U  Dr. Annie Main   ??? GERD (gastroesophageal reflux disease)   ??? Rhinitis nasal steroids resume & stay on nasal steroids. Moisturise w/ nasal saline.

## 2012-06-13 HISTORY — PX: THUMB FUSION: SUR636

## 2012-06-14 ENCOUNTER — Ambulatory Visit: Payer: Self-pay

## 2012-07-25 MED ORDER — LEVOFLOXACIN 500 MG PO TABS
500 MG | ORAL_TABLET | Freq: Every day | ORAL | Status: AC
Start: 2012-07-25 — End: 2012-08-04

## 2012-07-25 MED ORDER — MOMETASONE FUROATE 50 MCG/ACT NA SUSP
50 MCG/ACT | Freq: Every day | NASAL | Status: AC
Start: 2012-07-25 — End: ?

## 2012-07-25 NOTE — Progress Notes (Signed)
Subjective:      Patient ID: Bonnie Mitchell is a 42 y.o. female.  Chief Complaint   Patient presents with   ??? Cough     started Sunday, had some old atb (z pack) and she started those    ??? Chest Congestion   ??? Nausea & Vomiting       Cough  This is a new problem. The current episode started in the past 7 days. The problem has been unchanged. The problem occurs every few minutes. The cough is productive of purulent sputum. Associated symptoms include headaches, postnasal drip, rhinorrhea and a sore throat. Pertinent negatives include no chest pain, chills, fever or shortness of breath. The symptoms are aggravated by lying down. Treatments tried: zithromax 500 mg daily started 07/22/12. The treatment provided no relief.   Sputum production is thick and yellow.     Review of Systems   Constitutional: Positive for activity change and fatigue. Negative for fever and chills.   HENT: Positive for congestion, sore throat, rhinorrhea and postnasal drip.    Respiratory: Positive for cough. Negative for chest tightness and shortness of breath.    Cardiovascular: Negative for chest pain.   Gastrointestinal: Positive for nausea. Negative for vomiting, diarrhea and constipation.   Neurological: Positive for headaches. Negative for dizziness, syncope and weakness.   Psychiatric/Behavioral: Negative for confusion.       Objective:   Physical Exam   Nursing note and vitals reviewed.  Constitutional: She is oriented to person, place, and time. She appears well-developed and well-nourished. No distress.   HENT:   Head: Normocephalic and atraumatic.   Right Ear: Tympanic membrane, external ear and ear canal normal. Tympanic membrane is not erythematous.   Left Ear: Tympanic membrane, external ear and ear canal normal. Tympanic membrane is not erythematous.   Mouth/Throat: Posterior oropharyngeal erythema present. No oropharyngeal exudate.   Eyes: EOM are normal.   Neck: Neck supple.   Cardiovascular: Normal rate, regular rhythm and  normal heart sounds.  Exam reveals no gallop and no friction rub.    No murmur heard.  Pulmonary/Chest: Effort normal and breath sounds normal. No respiratory distress.   Musculoskeletal: She exhibits no edema.        Right ankle: She exhibits normal pulse.        Left ankle: She exhibits normal pulse.   Lymphadenopathy:        Head (right side): No submandibular adenopathy present.        Head (left side): No submandibular adenopathy present.   Neurological: She is alert and oriented to person, place, and time.   Skin: Skin is warm and dry.   Psychiatric: She has a normal mood and affect. Her behavior is normal. Judgment and thought content normal.       Assessment:      1. Cough    2. Bronchitis    3. Sinus congestion          Plan:      Allergies   Allergen Reactions   ??? Codeine Itching and Rash       Current Outpatient Prescriptions   Medication Sig Dispense Refill   ??? azithromycin (ZITHROMAX) 500 MG tablet 1 daily till gone for sinus/ bronchitis.  6 tablet  2   ??? LEXAPRO 10 MG tablet Take 1 tablet by mouth daily. For mood. (has not needed)   30 tablet  12   ??? mometasone (NASONEX) 50 MCG/ACT nasal spray 2 sprays by Nasal route daily.  For nasal & ear allergy symptoms.  1 Inhaler  11   ??? Cholecalciferol (VITAMIN D) 2000 UNITS CAPS capsule Take 1 capsule by mouth daily.       ??? calcium carbonate 600 MG TABS tablet Take 1 tablet by mouth 2 times daily.         No current facility-administered medications for this visit.       Filed Vitals:    07/25/12 1052   BP: 100/70   Pulse: 112   Temp: 98.9 ??F (37.2 ??C)   TempSrc: Oral   Resp: 18   Weight: 192 lb (87.091 kg)   SpO2: 98%     Body mass index is 35.11 kg/(m^2).     Wt Readings from Last 3 Encounters:   07/25/12 192 lb (87.091 kg)   05/31/12 188 lb 12.8 oz (85.639 kg)   08/24/11 193 lb (87.544 kg)     BP Readings from Last 3 Encounters:   07/25/12 100/70   05/31/12 124/80   08/24/11 119/72     Lilas was seen today for cough, chest congestion and nausea &  vomiting.    Diagnoses and associated orders for this visit:    Cough/ Bronchitis: should stop the zithromax and start  - levofloxacin (LEVAQUIN) 500 MG tablet; Take 1 tablet by mouth daily for 10 days. For infection  Increase fluids. Delsym PRN cough    Sinus congestion  - mometasone (NASONEX) 50 MCG/ACT nasal spray; 2 sprays by Nasal route daily. For nasal & ear allergy symptoms.  Patient reports that she can get dry mouth with this medication. Discussed that she needs to drink 64 oz of water daily. Can also use OTC dry mouth rinse.    Patient reports that her mood has been good and she has not needed the lexapro.    Past history needs to be more updated at next visit.

## 2012-08-02 ENCOUNTER — Encounter

## 2012-09-04 MED ORDER — PANTOPRAZOLE SODIUM 20 MG PO TBEC
20 MG | ORAL_TABLET | Freq: Every day | ORAL | Status: DC
Start: 2012-09-04 — End: 2012-10-05

## 2012-09-04 NOTE — Progress Notes (Signed)
Subjective:      Patient ID: Bonnie Mitchell is a 42 y.o. female.  Chief Complaint   Patient presents with   ??? Dysphagia     with food, dry mouth    ??? Heartburn   ??? Anxiety   ??? Gastrophageal Reflux       Heartburn  She complains of coughing, dysphagia, globus sensation, heartburn and a sore throat. She reports no abdominal pain, no chest pain or no nausea. This is a new problem. Episode onset: past 4 months, but worse this past month. The problem occurs frequently. The problem has been gradually worsening. The heartburn duration is an hour. The heartburn is located in the substernum. The heartburn is of moderate intensity. The heartburn wakes her from sleep. The heartburn does not limit her activity. The symptoms are aggravated by lying down. Pertinent negatives include no fatigue or muscle weakness. Risk factors include lack of exercise and obesity. She has tried nothing for the symptoms.   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 She reports that she has high anxiety. She just recently restarted her lexapro, but she is taking 5 mg daily. She also reports that she has PND and she is taking the nasonex for her symptoms. This is not really helping. She feels like there is mucus in her throat. She reports that sometimes when she is eating she feels like food is getting stuck.      Review of Systems   Constitutional: Negative for activity change, appetite change and fatigue.   HENT: Positive for sore throat, rhinorrhea, trouble swallowing and postnasal drip.         Positive for dry mouth   Respiratory: Positive for cough. Negative for chest tightness and shortness of breath.    Cardiovascular: Negative for chest pain.   Gastrointestinal: Positive for heartburn and dysphagia. Negative for nausea, vomiting, abdominal pain, diarrhea, constipation and blood in stool.   Musculoskeletal: Negative for muscle weakness.   Neurological: Negative for  dizziness, syncope, weakness and headaches.   Psychiatric/Behavioral: Negative for confusion. The patient is nervous/anxious.        Objective:   Physical Exam   Nursing note and vitals reviewed.  Constitutional: She is oriented to person, place, and time. She appears well-developed and well-nourished. No distress.   HENT:   Head: Normocephalic and atraumatic.   Eyes: EOM are normal.   Neck: Neck supple.   Cardiovascular: Normal rate, regular rhythm and normal heart sounds.  Exam reveals no gallop and no friction rub.    No murmur heard.  Pulmonary/Chest: Effort normal and breath sounds normal. No respiratory distress.   Abdominal: Soft. There is no tenderness.   Musculoskeletal: She exhibits no edema.        Right ankle: She exhibits normal pulse.        Left ankle: She exhibits normal pulse.   Lymphadenopathy:        Head (right side): No submandibular adenopathy present.        Head (left side): No submandibular adenopathy present.   Neurological: She is alert and oriented to person, place, and time.   Skin: Skin is warm and dry.   Psychiatric: She has a normal mood and affect. Her behavior is normal. Judgment and thought content normal.       Assessment:      1. GERD (gastroesophageal reflux disease)    2. Dysphagia    3.  Rhinitis    4. Anxiety          Plan:      Allergies   Allergen Reactions   ??? Codeine Itching and Rash       Current Outpatient Prescriptions   Medication Sig Dispense Refill   ??? vitamin B-12 (CYANOCOBALAMIN) 1000 MCG tablet Take 1,000 mcg by mouth daily.       ??? mometasone (NASONEX) 50 MCG/ACT nasal spray 2 sprays by Nasal route daily. For nasal & ear allergy symptoms.  1 Inhaler  11   ??? LEXAPRO 10 MG tablet Take 1 tablet by mouth daily. For mood. (has only been taking 1/2 tab daily)   30 tablet  12   ??? Cholecalciferol (VITAMIN D) 2000 UNITS CAPS capsule Take 1 capsule by mouth daily.       ??? calcium carbonate 600 MG TABS tablet Take 1 tablet by mouth 2 times daily.         No current  facility-administered medications for this visit.       Filed Vitals:    09/04/12 0906   BP: 122/84   Pulse: 68   Temp: 99.2 ??F (37.3 ??C)   TempSrc: Oral   Resp: 12   Weight: 187 lb (84.823 kg)     Body mass index is 34.19 kg/(m^2).     Wt Readings from Last 3 Encounters:   09/04/12 187 lb (84.823 kg)   07/25/12 192 lb (87.091 kg)   05/31/12 188 lb 12.8 oz (85.639 kg)     BP Readings from Last 3 Encounters:   09/04/12 122/84   07/25/12 100/70   05/31/12 124/80     Bonnie Mitchell was seen today for dysphagia, heartburn, anxiety and gastrophageal reflux.    Diagnoses and associated orders for this visit:    GERD (gastroesophageal reflux disease)/ Dysphagia: start  - pantoprazole (PROTONIX) 20 MG tablet; Take 1 tablet by mouth daily. For stomach  Avoid foods that can trigger heartburn. Can elevate head of bed at night when sleeping. Discussed that she may need an EGD if her dysphagia is not improving. Would refer to Dr. Christy Gentles al. Patient is to call if her symptoms are not improving.     Rhinitis/ PND: need to continue nasonex 2 sprays each nostril daily    Anxiety: need to stay on the lexapro 10 mg daily. May take a couple of weeks to see a big difference.

## 2012-09-04 NOTE — Patient Instructions (Signed)
Dr. Jonas, [place], [place], [place] [phone]

## 2012-09-07 ENCOUNTER — Telehealth

## 2012-09-07 MED ORDER — LORAZEPAM 1 MG PO TABS
1 MG | ORAL_TABLET | ORAL | Status: DC
Start: 2012-09-07 — End: 2012-10-05

## 2012-09-07 NOTE — Telephone Encounter (Signed)
I spoke to the patient. She reports that she is having a lot of anxiety. She is taking the lexapro 10 mg daily. She is taking the protonix ans she has not had reflux symptoms. She is still having dry mouth. Discussed that she needs to drink at least 64 oz of water daily. She can use OTC dry mouth rinse PRN. Discussed that she can start lorazepam 1 mg 1/2-1 tab PO BID. She is to call if this is not helping with her anxiety.

## 2012-09-19 NOTE — Telephone Encounter (Signed)
I called and spoke to the patient. She reports that she is not having a sore throat or reflux at this time. Discussed that she can stay off the protonix till she is able to see GI Dr. Elige Ko in two weeks. If she develops a sore throat or increased reflux symptoms before she is able to see Dr. Elige Ko she is to call here and will try a different PPI. She reports that her mood and anxiety is about 70% better than she was at her appt here in March 2014. She is to continue on the lexapro 10 mg daily. Patient should keep her appt here later this month.

## 2012-09-19 NOTE — Telephone Encounter (Signed)
Message copied by Warnell Forester on Wed Sep 19, 2012 12:36 PM  ------       Message from: Trevor Mace       Created: Wed Sep 19, 2012 10:20 AM       Regarding: FW: Non-Urgent Medical Question       Contact: [phone]                       ----- Message -----          From: Jerold Coombe          Sent: 09/19/2012  10:13 AM            To: [facility]       Subject: Non-Urgent Medical Question                                Having some serious side effects from the protonix.Marland Kitchen...was having NO appetite and NO interest in ANYTHING....almost like a deep depression.  I stopped taking it on Monday morning and felt progressively better during the day.  I did not take it yesterday and was actually able to get out and enjoy myself a little :)  feeling good today.  Lexapro seems to be kicking in, slight nervousness at times but my thinking is becoming more rational and I am able to process things more intelligently.  Using one lorezepam (sp) at night to sleep but am going to start to work on weaning that down.  I am not having any reflux...maybe some issues with swallowing (?) but i am working on staying calm and thinking rationally.  I have appointment with Dr. Elige Ko (old family friend that is GI) on April 24th and will keep my appoinment to see you on the 25th.  Should I go back on the protonix?  I really felt mentally awful on it.....  ------

## 2012-09-25 ENCOUNTER — Ambulatory Visit: Admit: 2012-09-25 | Discharge: 2012-09-25 | Payer: PRIVATE HEALTH INSURANCE

## 2012-09-25 DIAGNOSIS — Z01419 Encounter for gynecological examination (general) (routine) without abnormal findings: Secondary | ICD-10-CM

## 2012-09-25 LAB — HPV HIGH RISK WITH GENOTYPING
HPV DNA High Risk Oth: NEGATIVE
HPV DNA High Risk: NEGATIVE
HPV Genotype 16: NEGATIVE
HPV Genotype 18: NEGATIVE

## 2012-09-25 NOTE — Unmapped (Signed)
Subjective:       Patient ID: Julie Molina is a 42 y.o. female.    HPI Comments: Here for annual exam  Pap due  mmg done and normal  No complaints  Left ear bothering her      The following portions of the patient's history were reviewed and updated as appropriate:   She  has a past medical history of Anxiety and Depression.  She  does not have any pertinent problems on file.  She  has past surgical history that includes Cesarean section (1997) and ear spur (1998).  Her family history includes Cancer in her mother and Heart disease in her maternal grandfather and maternal grandmother.  She  reports that she has never smoked. She does not have any smokeless tobacco history on file. She reports that  drinks alcohol. She reports that she does not use illicit drugs.  She has a current medication list which includes the following prescription(s): calcium carbonate/vitamin d3, escitalopram oxalate, lorazepam, and multivitamin.  Current Outpatient Prescriptions on File Prior to Visit   Medication Sig Dispense Refill   ??? escitalopram (LEXAPRO) 20 MG tablet Take 20 mg by mouth daily.         No current facility-administered medications on file prior to visit.     She has No Known Allergies..        Review of Systems   Constitutional: Negative for fever, chills, appetite change, fatigue and unexpected weight change.   Respiratory: Negative for cough, choking, chest tightness, shortness of breath and wheezing.    Cardiovascular: Negative for chest pain, palpitations and leg swelling.   Gastrointestinal: Negative for nausea, vomiting, abdominal pain, diarrhea and constipation.   Genitourinary: Negative for dysuria, frequency, vaginal bleeding, vaginal discharge, difficulty urinating, vaginal pain and pelvic pain.   Neurological: Negative for dizziness, tremors, seizures, numbness and headaches.   All other systems reviewed and are negative.        Objective:    Physical Exam   Nursing note and vitals  reviewed.  Constitutional: She is oriented to person, place, and time. She appears well-developed and well-nourished. No distress.   HENT:   Head: Normocephalic and atraumatic.   Right Ear: External ear normal.   Left Ear: Tympanic membrane, external ear and ear canal normal.   Nose: Nose normal.   Eyes: Conjunctivae and EOM are normal. Right eye exhibits no discharge. Left eye exhibits no discharge. No scleral icterus.   Neck: Normal range of motion. Neck supple. No tracheal deviation present. No thyromegaly present.   Cardiovascular: Normal rate and regular rhythm.  Exam reveals no gallop and no friction rub.    No murmur heard.  Pulmonary/Chest: Effort normal and breath sounds normal. No stridor. No respiratory distress. She has no wheezes. She has no rales. She exhibits no tenderness. Right breast exhibits no inverted nipple, no mass, no nipple discharge, no skin change and no tenderness. Left breast exhibits no inverted nipple, no mass, no nipple discharge, no skin change and no tenderness. Breasts are symmetrical.   Abdominal: Soft. Bowel sounds are normal. She exhibits no distension and no mass. There is no tenderness. There is no rebound and no guarding.   Genitourinary: Vagina normal and uterus normal. No breast swelling, tenderness, discharge or bleeding. Pelvic exam was performed with patient supine. No labial fusion. There is no rash, tenderness, lesion or injury on the right labia. There is no rash, tenderness, lesion or injury on the left labia. Uterus is not deviated,  not enlarged, not fixed and not tender. Cervix exhibits no motion tenderness, no discharge and no friability. Right adnexum displays no mass, no tenderness and no fullness. Left adnexum displays no mass, no tenderness and no fullness. No erythema, tenderness or bleeding around the vagina. No foreign body around the vagina. No signs of injury around the vagina. No vaginal discharge found.   Urethral meatus normal   Musculoskeletal: Normal  range of motion. She exhibits no edema.   Lymphadenopathy:     She has no cervical adenopathy.        Right: No inguinal adenopathy present.        Left: No inguinal adenopathy present.   Neurological: She is alert and oriented to person, place, and time. Coordination normal.   Skin: Skin is warm and dry. No rash noted. She is not diaphoretic. No erythema.   Psychiatric: She has a normal mood and affect. Her behavior is normal. Judgment and thought content normal.               Assessment:   Normal annual exam    Plan:     1. Routine gynecological examination  Cytology - PAP Test, Diagnostic    Cytology - PAP Test, Diagnostic

## 2012-10-02 NOTE — Telephone Encounter (Signed)
From: Jerold Coombe  To: Warnell Forester, CNP  Sent: 10/02/2012 3:19 PM EDT  Subject: Non-Urgent Medical Question    have been meaning to send you a message to tell you how absolutely different i feel! I feel like a totally different person! I cannot believe that anxiety took that much of a toll on my body to the point it did, I will NEVER quit lexapro again! I am laughing, and planning, and living and smiling and am so much more relaxed. I have had no swallowing issues for the past week but think i should probably still keep my appointment with Dr. Elige Ko on Thursday. I will be glad to see you for follow up on friday...thank you for your patience and thoughtfulness during this episode. This was a very frightening time for me, and probably one of the scariest i have experienced. thank you for your care.

## 2012-10-05 MED ORDER — LORAZEPAM 1 MG PO TABS
1 MG | ORAL_TABLET | ORAL | Status: DC
Start: 2012-10-05 — End: 2013-06-14

## 2012-10-05 MED ORDER — LEXAPRO 10 MG PO TABS
10 MG | ORAL_TABLET | Freq: Two times a day (BID) | ORAL | Status: DC
Start: 2012-10-05 — End: 2012-10-16

## 2012-10-05 NOTE — Progress Notes (Signed)
Subjective:      Patient ID: Bonnie Mitchell is a 42 y.o. female.  Chief Complaint   Patient presents with   ??? 1 Month Follow-Up   ??? Pharyngitis       HPI  Patient reports that she is here for f/u on her anxiety and dysphagia. She reports that she was not able to tolerate the protonix because it made her mood worse. She has been taking the lexapro 15 mg daily. This has greatly decreased her anxiety. She is taking the lorazepam 1 mg 1/2 tab in the AM and a full tab in the PM. This is also helping with her anxiety. She had a visit with GI Dr. Elige Ko yesterday and they discussed since her dysphagia has resolved then there is no testing that he would need to do. She denies heartburn. If it would return she is to have an EGD. No new issues have developed.     Review of Systems   Constitutional: Negative for activity change and fatigue.   HENT: Positive for postnasal drip. Negative for sore throat.    Respiratory: Negative for chest tightness and shortness of breath.    Cardiovascular: Negative for chest pain.   Gastrointestinal: Negative for nausea, vomiting, diarrhea and constipation.   Neurological: Negative for dizziness, syncope, weakness and headaches.   Psychiatric/Behavioral: Negative for confusion.       Objective:   Physical Exam   Nursing note and vitals reviewed.  Constitutional: She is oriented to person, place, and time. She appears well-developed and well-nourished. No distress.   HENT:   Head: Normocephalic and atraumatic.   Eyes: EOM are normal.   Neck: Neck supple.   Cardiovascular: Normal rate, regular rhythm and normal heart sounds.  Exam reveals no gallop and no friction rub.    No murmur heard.  Pulmonary/Chest: Effort normal and breath sounds normal. No respiratory distress.   Musculoskeletal: She exhibits no edema.        Right ankle: She exhibits normal pulse.        Left ankle: She exhibits normal pulse.   Neurological: She is alert and oriented to person, place, and time.   Skin: Skin is warm  and dry.   Psychiatric: She has a normal mood and affect. Her behavior is normal. Judgment and thought content normal.       Assessment:      1. Anxiety    2. Dysphagia          Plan:      Allergies   Allergen Reactions   ??? Codeine Itching and Rash       Current Outpatient Prescriptions   Medication Sig Dispense Refill   ??? LORazepam (ATIVAN) 1 MG tablet Take 1/2-1 tab PO BID PRN stress/stress  40 tablet  0   ??? vitamin B-12 (CYANOCOBALAMIN) 1000 MCG tablet Take 1,000 mcg by mouth daily.       ??? mometasone (NASONEX) 50 MCG/ACT nasal spray 2 sprays by Nasal route daily. For nasal & ear allergy symptoms.  1 Inhaler  11   ??? LEXAPRO 10 MG tablet Take 1 tablet by mouth daily. For mood.  30 tablet  12   ??? Cholecalciferol (VITAMIN D) 2000 UNITS CAPS capsule Take 1 capsule by mouth daily.       ??? calcium carbonate 600 MG TABS tablet Take 1 tablet by mouth 2 times daily.         No current facility-administered medications for this visit.  Filed Vitals:    10/05/12 1010   BP: 110/60   Pulse: 67   Temp: 98.7 ??F (37.1 ??C)   TempSrc: Oral   Resp: 12   Weight: 186 lb (84.369 kg)   SpO2: 98%     Body mass index is 34.01 kg/(m^2).     Wt Readings from Last 3 Encounters:   10/05/12 186 lb (84.369 kg)   09/04/12 187 lb (84.823 kg)   07/25/12 192 lb (87.091 kg)     BP Readings from Last 3 Encounters:   10/05/12 110/60   09/04/12 122/84   07/25/12 100/70     Bonnie Mitchell was seen today for 1 month follow-up and pharyngitis.    Diagnoses and associated orders for this visit:    Anxiety  - LEXAPRO (DAW) 10 MG tablet; Take 1 tablet by mouth 2 times daily. For mood. (she is currently taking 1 tab in the AM and 1/2 tab in the PM)  - LORazepam (ATIVAN) 1 MG tablet; Take 1/2-1 tab PO BID PRN stress/stress. #45 must last 30 days  Goal is to decrease lorazepam use in the future.    Dysphagia: symptoms resolved from last visit. If symptoms return, will need to f/u with Dr. Elige Ko for EGD

## 2012-10-16 ENCOUNTER — Encounter

## 2012-10-16 MED ORDER — LEXAPRO 20 MG PO TABS
20 MG | ORAL_TABLET | Freq: Every day | ORAL | Status: DC
Start: 2012-10-16 — End: 2012-11-21

## 2012-10-16 NOTE — Telephone Encounter (Signed)
I think she is currently taking 10 mg in the AM and only 5 mg in the PM and that is why it is written that way.

## 2012-10-16 NOTE — Telephone Encounter (Signed)
Left message for patient to call office re: script

## 2012-10-16 NOTE — Telephone Encounter (Signed)
Patient ok with changing script to lexapro 20mg  qd. Script is pended in epic

## 2012-10-16 NOTE — Telephone Encounter (Signed)
Does patient want to change the Rx? Patient gets lexapro DAW.

## 2012-10-16 NOTE — Telephone Encounter (Signed)
Pharmacist wants to know if we can send a new script for lexapro 20mg  qd to replace the one we sent for lexapro 10mg  bid. This would decrease patient cost. [facility] phone [phone].

## 2012-11-21 MED ORDER — LEXAPRO 20 MG PO TABS
20 MG | ORAL_TABLET | Freq: Every day | ORAL | Status: DC
Start: 2012-11-21 — End: 2012-11-21

## 2012-11-21 MED ORDER — LEXAPRO 20 MG PO TABS
20 MG | ORAL_TABLET | Freq: Every day | ORAL | Status: DC
Start: 2012-11-21 — End: 2013-06-14

## 2012-11-21 MED ORDER — METHYLPREDNISOLONE ACETATE 40 MG/ML IJ SUSP
40 MG/ML | Freq: Once | INTRAMUSCULAR | Status: AC
Start: 2012-11-21 — End: 2012-11-21

## 2012-11-21 NOTE — Addendum Note (Signed)
Addended by: Dema Severin on: 11/21/2012 11:37 AM     Modules accepted: Orders

## 2012-11-21 NOTE — Progress Notes (Signed)
Subjective:      Patient ID: Bonnie Mitchell is a 42 y.o. female.  Chief Complaint   Patient presents with   ??? Medication Refill   ??? 6 Month Follow-Up       HPI  Patient reports that she is here for f/u on her anxiety and dysphagia. She reports that she was not able to tolerate the protonix because it made her mood worse. She has been taking the lexapro 15 mg daily. This has greatly decreased her anxiety. She is taking the lorazepam 1 mg 1/2 tab in the AM and a full tab in the PM. This is also helping with her anxiety. She had a visit with GI Bonnie Mitchell yesterday and they discussed since her dysphagia has resolved then there is no testing that he would need to do. She denies heartburn. If it would return she is to have an EGD. No new issues have developed.     Review of Systems   Constitutional: Negative for activity change and fatigue.   HENT: Positive for postnasal drip. Negative for sore throat.    Respiratory: Negative for chest tightness and shortness of breath.    Cardiovascular: Negative for chest pain.   Gastrointestinal: Negative for nausea, vomiting, diarrhea and constipation.   Neurological: Negative for dizziness, syncope, weakness and headaches.   Psychiatric/Behavioral: Negative for confusion.       Objective:   Physical Exam   Nursing note and vitals reviewed.  Constitutional: She is oriented to person, place, and time. She appears well-developed and well-nourished. No distress.   HENT:   Head: Normocephalic and atraumatic.   Eyes: EOM are normal.   Neck: Neck supple.   Cardiovascular: Normal rate, regular rhythm and normal heart sounds.  Exam reveals no gallop and no friction rub.    No murmur heard.  Pulmonary/Chest: Effort normal and breath sounds normal. No respiratory distress.   Musculoskeletal: She exhibits no edema.        Right ankle: She exhibits normal pulse.        Left ankle: She exhibits normal pulse.   Neurological: She is alert and oriented to person, place, and time.   Skin: Skin is  warm and dry.   Psychiatric: She has a normal mood and affect. Her behavior is normal. Judgment and thought content normal.       Assessment:      1. Depression screening    2. Mood disorder          Plan:      Allergies   Allergen Reactions   ??? Codeine Itching and Rash       Current Outpatient Prescriptions   Medication Sig Dispense Refill   ??? LORazepam (ATIVAN) 1 MG tablet Take 1/2-1 tab PO BID PRN stress/stress  40 tablet  0   ??? vitamin B-12 (CYANOCOBALAMIN) 1000 MCG tablet Take 1,000 mcg by mouth daily.       ??? mometasone (NASONEX) 50 MCG/ACT nasal spray 2 sprays by Nasal route daily. For nasal & ear allergy symptoms.  1 Inhaler  11   ??? LEXAPRO 10 MG tablet Take 1 tablet by mouth daily. For mood.  30 tablet  12   ??? Cholecalciferol (VITAMIN D) 2000 UNITS CAPS capsule Take 1 capsule by mouth daily.       ??? calcium carbonate 600 MG TABS tablet Take 1 tablet by mouth 2 times daily.         No current facility-administered medications for this visit.  Filed Vitals:    11/21/12 1025   BP: 126/78   Pulse: 60   Temp: 98.6 ??F (37 ??C)   TempSrc: Oral   Height: 5\' 2"  (1.575 m)   Weight: 186 lb 12.8 oz (84.732 kg)   SpO2: 96%     Body mass index is 34.16 kg/(m^2).     Wt Readings from Last 3 Encounters:   11/21/12 186 lb 12.8 oz (84.732 kg)   10/05/12 186 lb (84.369 kg)   09/04/12 187 lb (84.823 kg)     BP Readings from Last 3 Encounters:   11/21/12 126/78   10/05/12 110/60   09/04/12 122/84     [name] was seen today for 1 month follow-up and pharyngitis.    Diagnoses and associated orders for this visit:  Patient Active Problem List   Diagnosis   ??? Hypercholesteremia   ??? Adjustment disorder   ??? Atypical nevi- cont F/U Bonnie Mitchell. Occ resected nevi form keloids which are txed w/ intralesional Kenalog inj with Bonnie Mitchell.   ??? GERD (gastroesophageal reflux disease)   ??? Rhinitis   ??? OSA (obstructive sleep apnea)   ??? Cough   ??? Sinus congestion   ??? Dysphagia   ??? Anxiety- Lexapro 20mg  qd sched, PRN lorazepam, trying to limit use to  20 or so days per mo.     Anxiety  - LEXAPRO (DAW) 10 MG tablet; Take 1 tablet by mouth 2 times daily. For mood. (she is currently taking 1 tab in the AM and 1/2 tab in the PM)  - LORazepam (ATIVAN) 1 MG tablet; Take 1/2-1 tab PO BID PRN stress/stress. Given 10/05/12 last visit #45 must last 30 days, reenforced 11/21/12 to limit use to 20 or so days per mo.  Goal is to decrease lorazepam use in the future.    Dysphagia: symptoms resolved from last visit. If symptoms return, will need to f/u with Bonnie Mitchell for EGD

## 2012-11-21 NOTE — Patient Instructions (Addendum)
Try to limit lorazepam use to 20 or so days/ nights per month. This will avoid habituation/ getting too used to it.    Do L shoulder stretches. See Drs. Augustina Mood, Kremcheck, McClung et al at [facility] [phone] if injection & stretches don't.

## 2013-03-11 NOTE — Telephone Encounter (Signed)
Called patient and they want an ok to take away the DAW. Waiting for the response from Maralyn SagoSarah

## 2013-03-11 NOTE — Telephone Encounter (Deleted)
[facility] sent in a refill for lexapro and she has refills

## 2013-06-14 MED ORDER — VITAMIN B-12 1000 MCG PO TABS
1000 MCG | ORAL_TABLET | ORAL | Status: AC
Start: 2013-06-14 — End: ?

## 2013-06-14 MED ORDER — LEXAPRO 20 MG PO TABS
20 MG | ORAL_TABLET | Freq: Every day | ORAL | Status: AC
Start: 2013-06-14 — End: ?

## 2013-06-14 MED ORDER — CHOLECALCIFEROL 125 MCG (5000 UT) PO CAPS
125 MCG (5000 UT) | ORAL_CAPSULE | ORAL | Status: AC
Start: 2013-06-14 — End: ?

## 2013-06-14 MED ORDER — LORAZEPAM 1 MG PO TABS
1 MG | ORAL_TABLET | ORAL | Status: AC
Start: 2013-06-14 — End: ?

## 2013-06-14 NOTE — Patient Instructions (Addendum)
FYI: I'm resuming independent Internal Medicine practice, free from corporate encumbrance, as of 07/14/13 with no change in our phone or address. We will however be able to resume the "personal care" most of our patients were used to & deserve. Thank you for your trust in [place] over the years we have cared for you.    Everyone should take OTC B12 1000 mcg twice a day for neurological health and as a natural energy booster (read a bottle of 5 Hour Energy or diet Red Bull). High B12 levels are proven to help prevent dementia.    All patients should take 5000 IU or more Vitamin D every day for good general health. There are myriad benefits to higher Vitamin D levels. Women after age 78 should also take Calcium 600 mg twice a day for bone health.     Aerobic/ walking (or similar biking, hard yard work, Biochemist, clinical) exercise for 40+ minutes 3 days every week without fail year round needed for natural stress relief, weight control, useful longevity and good health.

## 2013-06-14 NOTE — Progress Notes (Addendum)
Subjective:      Patient ID: Bonnie Mitchell is a 43 y.o. female.  Chief Complaint   Patient presents with   ??? 6 Month Follow-Up   ??? Depression   ??? Flu Vaccine       HPI  Patient reports that she is here for f/u on her anxiety and dysphagia, largely resolved as of 06/14/13. She reports that she was not able to tolerate the protonix because it made her mood worse. She has been taking the lexapro 15 mg daily. This has greatly decreased her anxiety. She is taking the lorazepam 1 mg 1/2 tab in the AM and a full tab in the PM. This is also helping with her anxiety. She had a visit with GI Dr. Delman Cheadle yesterday and they discussed since her dysphagia has resolved then there is no testing that he would need to do. She denies heartburn. If it would return she is to have an EGD. No new issues have developed.     Review of Systems   Constitutional: Negative for activity change and fatigue.   HENT: Positive for postnasal drip. Negative for sore throat.    Respiratory: Negative for chest tightness and shortness of breath.    Cardiovascular: Negative for chest pain.   Gastrointestinal: Negative for nausea, vomiting, diarrhea and constipation.   Neurological: Negative for dizziness, syncope, weakness and headaches.   Psychiatric/Behavioral: Negative for confusion.       Objective:   Physical Exam   Nursing note and vitals reviewed.  Constitutional: She is oriented to person, place, and time. She appears well-developed and well-nourished. No distress.   HENT:   Head: Normocephalic and atraumatic.   Eyes: EOM are normal.   Neck: Neck supple.   Cardiovascular: Normal rate, regular rhythm and normal heart sounds.  Exam reveals no gallop and no friction rub.    No murmur heard.  Pulmonary/Chest: Effort normal and breath sounds normal. No respiratory distress.   Musculoskeletal: She exhibits no edema.        Right ankle: She exhibits normal pulse.        Left ankle: She exhibits normal pulse.   Neurological: She is alert and oriented to  person, place, and time.   Skin: Skin is warm and dry.   Psychiatric: She has a normal mood and affect. Her behavior is normal. Judgment and thought content normal.       Assessment:      1. Anemia    2. Mood disorder ([place])    3. Depression screening    4. Disorder of left rotator cuff    5. Hypercholesteremia    6. Adjustment disorder    7. Atypical nevi    8. GERD (gastroesophageal reflux disease)    9. Anxiety    10. Hypothyroid    11. Need for prophylactic vaccination and inoculation against influenza          Plan:      Allergies   Allergen Reactions   ??? Codeine Itching and Rash     Current Outpatient Prescriptions   Medication Sig Dispense Refill   ??? vitamin B-12 (CYANOCOBALAMIN) 1000 MCG tablet Everyone should take OTC B12 1000 mcg twice a day for neurological health and as a natural energy booster (read a bottle of 5 Hour Energy or diet Red Bull). High B12 levels are proven to help prevent dementia.  180 tablet  12   ??? Cholecalciferol ([employer] VIT D 5000 HIGH-POTENCY) 5000 UNITS CAPS capsule All patients should  take 5000 IU or more Vitamin D every day for good general health. There are myriad benefits to higher Vitamin D levels. Women over 50 should also take Calcium 600 mg twice a day for bone health.  90 capsule  12   ??? LEXAPRO 20 MG tablet Take 1 tablet by mouth daily. For mood.  30 tablet  12   ??? LORazepam (ATIVAN) 1 MG tablet Take 1/2-1 tab PO BID PRN stress/stress. #45 must last 30 days  45 tablet  5   ??? calcium carbonate 600 MG TABS tablet Take 1 tablet by mouth 2 times daily.       ??? mometasone (NASONEX) 50 MCG/ACT nasal spray 2 sprays by Nasal route daily. For nasal & ear allergy symptoms.  1 Inhaler  11     No current facility-administered medications for this visit.       Filed Vitals:    06/14/13 1020   BP: 102/60   Pulse: 54   Height: '5\' 2"'  (1.575 m)   Weight: 199 lb (90.266 kg)   SpO2: 98%     Body mass index is 36.39 kg/(m^2).     Wt Readings from Last 3 Encounters:   06/14/13 199 lb (90.266 kg)    11/21/12 186 lb 12.8 oz (84.732 kg)   10/05/12 186 lb (84.369 kg)     BP Readings from Last 3 Encounters:   06/14/13 102/60   11/21/12 126/78   10/05/12 110/60     Estelle was seen today for 1 month follow-up and pharyngitis.    Diagnoses and associated orders for this visit:  Patient Active Problem List   Diagnosis   ??? Hypercholesteremia- lipids P 06/14/13   ??? Adjustment disorder   ??? Atypical nevi- cont F/U Dr. Verlee Monte. Occ resected nevi form keloids which are txed w/ intralesional Kenalog inj with Dr. Verlee Monte.   ??? GERD (gastroesophageal reflux disease)   ??? Rhinitis   ??? OSA (obstructive sleep apnea)   ??? Cough   ??? Sinus congestion   ??? Dysphagia   ??? Anxiety- Lexapro 72m qd sched, PRN lorazepam, trying to limit use to 20 or so days per mo.     Anxiety  - LEXAPRO (DAW) 10 MG tablet; Take 1 tablet by mouth 2 times daily. For mood. (she is currently taking 1 tab in the AM and 1/2 tab in the PM)  - LORazepam (ATIVAN) 1 MG tablet; Take 1/2-1 tab PO BID PRN stress/stress. Given 10/05/12 last visit #45 must last 30 days, reenforced 11/21/12 to limit use to 20 or so days per mo.  Goal is to decrease lorazepam use in the future.    Dysphagia: symptoms resolved from last visit as of 06/14/13. If symptoms return, will need to f/u with Dr. FDelman Cheadlefor EGD    Re: Mrs. SSHARRONDA SCHWEERS([phone]                                                          Feb. 16, 2015        [address]        [place] [address]         Pre[facility]ive [facility]        Internal Medicine on June 14, 2013.  To Whom it May Concern (fax to [phone]),    Mrs. Ravenne Wayment Murguia's (April 08, 1971) visit at our office on Friday June 14, 2013 was solely an [employer] (coded [phone]). This letter is documentation as to this fact.    Please contact me with any questions.    Sincerely,                         Waymon Amato, MD

## 2013-06-15 LAB — COMPREHENSIVE METABOLIC PANEL
ALT: 33 U/L (ref 10–40)
AST: 21 U/L (ref 15–37)
Albumin/Globulin Ratio: 1.7 (ref 1.1–2.2)
Albumin: 4.5 g/dL (ref 3.4–5.0)
Alkaline Phosphatase: 83 U/L (ref 40–129)
Anion Gap: 13 (ref 3–16)
BUN: 9 mg/dL (ref 7–20)
CO2: 24 mmol/L (ref 21–32)
Calcium: 9.1 mg/dL (ref 8.3–10.6)
Chloride: 103 mmol/L (ref 99–110)
Creatinine: 0.6 mg/dL (ref 0.6–1.1)
GFR African American: >60 (ref >60)
GFR Non-African American: >60 (ref >60)
Globulin: 2.6 g/dL
Glucose: 113 mg/dL — ABNORMAL HIGH (ref 70–99)
Potassium: 4.7 mmol/L (ref 3.5–5.1)
Sodium: 140 mmol/L (ref 136–145)
Total Bilirubin: 0.3 mg/dL (ref 0.0–1.0)
Total Protein: 7.1 g/dL (ref 6.4–8.2)

## 2013-06-15 LAB — LIPID PANEL
Cholesterol, Total: 228 mg/dL — ABNORMAL HIGH (ref 0–199)
HDL: 61 mg/dL — ABNORMAL HIGH (ref 40–60)
LDL Calculated: 141 mg/dL — ABNORMAL HIGH (ref <100)
Triglycerides: 130 mg/dL (ref 0–150)
VLDL Cholesterol Calculated: 26 mg/dL

## 2013-06-15 LAB — TSH WITH REFLEX: TSH: 2.21 u[IU]/mL (ref 0.27–4.20)

## 2013-06-15 LAB — CBC
Hematocrit: 42.5 % (ref 36.0–48.0)
Hemoglobin: 13.4 g/dL (ref 12.0–16.0)
MCH: 27.4 pg (ref 26.0–34.0)
MCHC: 31.4 g/dL (ref 31.0–36.0)
MCV: 87.2 fL (ref 80.0–100.0)
MPV: 10 fL (ref 5.0–10.5)
Platelets: 198 10*3/uL (ref 135–450)
RBC: 4.88 M/uL (ref 4.00–5.20)
RDW: 13.3 % (ref 12.4–15.4)
WBC: 5.6 10*3/uL (ref 4.0–11.0)

## 2013-07-15 ENCOUNTER — Ambulatory Visit: Payer: Self-pay

## 2013-08-05 ENCOUNTER — Encounter

## 2013-08-19 NOTE — Addendum Note (Signed)
Addended by: Darden DatesLANG, Estela Vinal E on: 08/19/2013 07:59 PM     Modules accepted: Level of Service

## 2013-09-27 ENCOUNTER — Encounter: Payer: PRIVATE HEALTH INSURANCE | Attending: Family

## 2013-10-07 ENCOUNTER — Encounter: Payer: PRIVATE HEALTH INSURANCE | Attending: Family

## 2013-10-08 ENCOUNTER — Encounter: Payer: PRIVATE HEALTH INSURANCE | Attending: Family

## 2013-10-15 ENCOUNTER — Ambulatory Visit: Admit: 2013-10-15 | Discharge: 2013-10-15 | Payer: PRIVATE HEALTH INSURANCE | Attending: Family

## 2013-10-15 DIAGNOSIS — Z01419 Encounter for gynecological examination (general) (routine) without abnormal findings: Secondary | ICD-10-CM

## 2013-10-15 NOTE — Unmapped (Signed)
Subjective:      Julie Molina is a 43 y.o. female who presents for an annual exam. The patient has no complaints today. The patient is sexually active, 1 lifetime partner. GYN screening history: last pap: approximate date 2014, cotested with HPV and was normal. The patient wears seatbelts: yes. The patient participates in regular exercise: yes. Has the patient ever been transfused or tattooed?: no. The patient reports that there is not domestic violence in her life. Mammogram this yr negative at [facility];    Menstrual History:  OB History    Grav Para Term Preterm Abortions TAB SAB Ect Mult Living    6 3 3  3  3               Patient's last menstrual period was 10/06/2013.       Past Medical History   Diagnosis Date   ??? Anxiety    ??? Depression    ??? Melanoma      Past Surgical History   Procedure Laterality Date   ??? Cesarean section  1997   ??? Ear spur  1998     right     Family History   Problem Relation Age of Onset   ??? Cancer Mother      lung cancer   ??? Heart disease Maternal Grandmother    ??? Heart disease Maternal Grandfather      History   Substance Use Topics   ??? Smoking status: Never Smoker    ??? Smokeless tobacco: Not on file   ??? Alcohol Use: No     No Known Allergies      Review of Systems  Constitutional: negative  Respiratory: negative  Cardiovascular: negative  Gastrointestinal: negative  Genitourinary:negative  Integument/breast: negative  Hematologic/lymphatic: negative  Musculoskeletal:negative  Neurological: negative  Behavioral/Psych: negative  Endocrine: negative  Allergic/Immunologic: negative      Objective:      BP 128/70   Pulse 82   Temp(Src) 98.4 ??F (36.9 ??C) (Oral)   Wt 198 lb (89.812 kg)   BMI 35.62 kg/m2   SpO2 98%   LMP 10/06/2013  General appearance: alert, appears stated age and cooperative  Neck: no adenopathy, supple, symmetrical, trachea midline and thyroid not enlarged, symmetric, no tenderness/mass/nodules  Back: symmetric, no curvature. ROM normal. No CVA tenderness.  Lungs:  clear to auscultation bilaterally and no respiratory distress  Breasts: normal appearance, no masses or tenderness  Heart: regular rate and rhythm, S1, S2 normal, no murmur, click, rub or gallop  Abdomen: soft, non-tender; bowel sounds normal; no masses,  no organomegaly  Pelvic: cervix normal in appearance, external genitalia normal, no adnexal masses or tenderness, no cervical motion tenderness, rectovaginal septum normal, uterus normal size, shape, and consistency and vagina normal without discharge  Lymph nodes: Cervical, supraclavicular, and axillary nodes normal..      Assessment:        1. Routine gynecological examination    2. Obesity (BMI 30-39.9)        Plan:       All questions answered.  Breast self exam technique reviewed and patient encouraged to perform self-exam monthly.  Contraception: vasectomy.  Discussed healthy lifestyle modifications.  Follow up as needed.

## 2014-01-17 DIAGNOSIS — Q6102 Congenital multiple renal cysts: Secondary | ICD-10-CM | POA: Insufficient documentation

## 2014-01-17 DIAGNOSIS — G5760 Lesion of plantar nerve, unspecified lower limb: Secondary | ICD-10-CM | POA: Insufficient documentation

## 2014-01-17 DIAGNOSIS — L4050 Arthropathic psoriasis, unspecified: Secondary | ICD-10-CM | POA: Insufficient documentation

## 2014-01-17 DIAGNOSIS — Z862 Personal history of diseases of the blood and blood-forming organs and certain disorders involving the immune mechanism: Secondary | ICD-10-CM | POA: Insufficient documentation

## 2014-02-03 ENCOUNTER — Ambulatory Visit: Payer: Self-pay | Admitting: Rheumatology

## 2014-08-07 ENCOUNTER — Encounter

## 2014-08-07 ENCOUNTER — Inpatient Hospital Stay: Admit: 2014-08-07 | Attending: Internal Medicine | Primary: Internal Medicine

## 2014-08-07 DIAGNOSIS — Z1231 Encounter for screening mammogram for malignant neoplasm of breast: Secondary | ICD-10-CM

## 2014-10-24 ENCOUNTER — Encounter: Payer: PRIVATE HEALTH INSURANCE | Attending: Family

## 2014-11-11 ENCOUNTER — Encounter: Payer: Self-pay | Admitting: *Deleted

## 2014-11-11 ENCOUNTER — Other Ambulatory Visit: Payer: Self-pay

## 2014-11-11 DIAGNOSIS — M9420 Chondromalacia, unspecified site: Secondary | ICD-10-CM | POA: Diagnosis not present

## 2014-11-11 DIAGNOSIS — Z86010 Personal history of colonic polyps: Secondary | ICD-10-CM | POA: Diagnosis not present

## 2014-11-11 DIAGNOSIS — Z8371 Family history of colonic polyps: Secondary | ICD-10-CM | POA: Diagnosis not present

## 2014-11-11 DIAGNOSIS — Z79899 Other long term (current) drug therapy: Secondary | ICD-10-CM | POA: Diagnosis not present

## 2014-11-11 DIAGNOSIS — M23204 Derangement of unspecified medial meniscus due to old tear or injury, left knee: Secondary | ICD-10-CM | POA: Diagnosis present

## 2014-11-11 DIAGNOSIS — L4050 Arthropathic psoriasis, unspecified: Secondary | ICD-10-CM | POA: Diagnosis not present

## 2014-11-11 DIAGNOSIS — M23222 Derangement of posterior horn of medial meniscus due to old tear or injury, left knee: Secondary | ICD-10-CM | POA: Diagnosis not present

## 2014-11-11 NOTE — Patient Instructions (Signed)
  Your procedure is scheduled on: 11-18-14 Report to [facility] To find out your arrival time please call [phone] between 1PM - 3PM on 11-17-14  Remember: Instructions that are not followed completely may result in serious medical risk, up to and including death, or upon the discretion of your surgeon and anesthesiologist your surgery may need to be rescheduled.    _X___ 1. Do not eat food or drink liquids after midnight. No gum chewing or hard candies.     _X___ 2. No Alcohol for 24 hours before or after surgery.   ____ 3. Bring all medications with you on the day of surgery if instructed.    ____ 4. Notify your doctor if there is any change in your medical condition     (cold, fever, infections).     Do not wear jewelry, make-up, hairpins, clips or nail polish.  Do not wear lotions, powders, or perfumes. You may wear deodorant.  Do not shave 48 hours prior to surgery. Men may shave face and neck.  Do not bring valuables to the hospital.    [facility] is not responsible for any belongings or valuables.               Contacts, dentures or bridgework may not be worn into surgery.  Leave your suitcase in the car. After surgery it may be brought to your room.  For patients admitted to the hospital, discharge time is determined by your treatment team.   Patients discharged the day of surgery will not be allowed to drive home.   Please read over the following fact sheets that you were given:      ____ Take these medicines the morning of surgery with A SIP OF WATER: NONE   1.  2.   3.   4.  5.  6.  ____ Fleet Enema (as directed)   ____ Use CHG Soap as directed  ____ Use inhalers on the day of surgery  ____ Stop metformin 2 days prior to surgery    ____ Take 1/2 of usual insulin dose the night before surgery and none on the morning of surgery.   ____ Stop Coumadin/Plavix/aspirin-N/A  ____ Stop Anti-inflammatories-STOP NAPROXEN NOW-NO NSAIDS  OR ASA PRODUCTS-TYLENOL OK   ____ Stop supplements until after surgery.    ____ Bring C-Pap to the hospital.

## 2014-11-18 ENCOUNTER — Ambulatory Visit
Admission: RE | Admit: 2014-11-18 | Discharge: 2014-11-18 | Disposition: A | Discharge status: Home / Self Care | Payer: BC Managed Care – PPO | Source: Ambulatory Visit | Attending: Orthopedic Surgery | Admitting: Orthopedic Surgery

## 2014-11-18 ENCOUNTER — Ambulatory Visit: Payer: BC Managed Care – PPO | Admitting: Anesthesiology

## 2014-11-18 ENCOUNTER — Encounter: Payer: Self-pay | Admitting: *Deleted

## 2014-11-18 ENCOUNTER — Encounter
Admission: RE | Disposition: A | Discharge status: Home / Self Care | Payer: Self-pay | Source: Ambulatory Visit | Attending: Orthopedic Surgery

## 2014-11-18 DIAGNOSIS — L4050 Arthropathic psoriasis, unspecified: Secondary | ICD-10-CM | POA: Insufficient documentation

## 2014-11-18 DIAGNOSIS — M23222 Derangement of posterior horn of medial meniscus due to old tear or injury, left knee: Secondary | ICD-10-CM | POA: Insufficient documentation

## 2014-11-18 DIAGNOSIS — Z79899 Other long term (current) drug therapy: Secondary | ICD-10-CM | POA: Insufficient documentation

## 2014-11-18 DIAGNOSIS — M9420 Chondromalacia, unspecified site: Secondary | ICD-10-CM | POA: Insufficient documentation

## 2014-11-18 DIAGNOSIS — Z86010 Personal history of colonic polyps: Secondary | ICD-10-CM | POA: Insufficient documentation

## 2014-11-18 DIAGNOSIS — Z8371 Family history of colonic polyps: Secondary | ICD-10-CM | POA: Insufficient documentation

## 2014-11-18 HISTORY — DX: Chronic kidney disease, unspecified: N18.9

## 2014-11-18 HISTORY — PX: KNEE ARTHROSCOPY: SHX127

## 2014-11-18 HISTORY — DX: Anemia, unspecified: D64.9

## 2014-11-18 HISTORY — DX: Cardiac murmur, unspecified: R01.1

## 2014-11-18 HISTORY — DX: Unspecified osteoarthritis, unspecified site: M19.90

## 2014-11-18 LAB — POCT PREGNANCY, URINE: PREG TEST UR: NEGATIVE

## 2014-11-18 SURGERY — ARTHROSCOPY, KNEE
Anesthesia: General | Site: Knee | Laterality: Left | Wound class: Clean

## 2014-11-18 MED ORDER — FAMOTIDINE 20 MG PO TABS
20.0000 mg | ORAL_TABLET | Freq: Once | ORAL | Status: AC
  Administered 2014-11-18: 20 mg via ORAL

## 2014-11-18 MED ORDER — LACTATED RINGERS IV SOLN
INTRAVENOUS | Status: DC
  Administered 2014-11-18 (×2): via INTRAVENOUS

## 2014-11-18 MED ORDER — HYDROCODONE-ACETAMINOPHEN 5-325 MG PO TABS: 1.0000 | ORAL_TABLET | Freq: Four times a day (QID) | ORAL | Status: DC | PRN

## 2014-11-18 MED ORDER — MIDAZOLAM HCL 2 MG/2ML IJ SOLN
INTRAMUSCULAR | Status: DC | PRN
  Administered 2014-11-18: 2 mg via INTRAVENOUS

## 2014-11-18 MED ORDER — FENTANYL CITRATE (PF) 100 MCG/2ML IJ SOLN
INTRAMUSCULAR | Status: DC | PRN
  Administered 2014-11-18 (×2): 50 ug via INTRAVENOUS

## 2014-11-18 MED ORDER — LACTATED RINGERS IR SOLN
Status: DC | PRN
  Administered 2014-11-18: 6000 mL

## 2014-11-18 MED ORDER — BUPIVACAINE-EPINEPHRINE (PF) 0.5% -1:200000 IJ SOLN
INTRAMUSCULAR | Status: AC
  Filled 2014-11-18: qty 30

## 2014-11-18 MED ORDER — FENTANYL CITRATE (PF) 100 MCG/2ML IJ SOLN
INTRAMUSCULAR | Status: AC
Start: 2014-11-18 — End: 2014-11-18
  Administered 2014-11-18: 25 ug via INTRAVENOUS
  Filled 2014-11-18: qty 2

## 2014-11-18 MED ORDER — LIDOCAINE HCL (CARDIAC) 20 MG/ML IV SOLN
INTRAVENOUS | Status: DC | PRN
  Administered 2014-11-18: 30 mg via INTRAVENOUS

## 2014-11-18 MED ORDER — FAMOTIDINE 20 MG PO TABS
ORAL_TABLET | ORAL | Status: AC
  Administered 2014-11-18: 20 mg via ORAL
  Filled 2014-11-18: qty 1

## 2014-11-18 MED ORDER — BUPIVACAINE-EPINEPHRINE (PF) 0.5% -1:200000 IJ SOLN
INTRAMUSCULAR | Status: DC | PRN
Start: 2014-11-18 — End: 2014-11-18
  Administered 2014-11-18: 20 mL via PERINEURAL

## 2014-11-18 MED ORDER — ONDANSETRON HCL 4 MG/2ML IJ SOLN: 4.0000 mg | Freq: Once | INTRAMUSCULAR | Status: DC | PRN

## 2014-11-18 MED ORDER — PROPOFOL 10 MG/ML IV BOLUS
INTRAVENOUS | Status: DC | PRN
  Administered 2014-11-18: 150 mg via INTRAVENOUS

## 2014-11-18 MED ORDER — FENTANYL CITRATE (PF) 100 MCG/2ML IJ SOLN
25.0000 ug | INTRAMUSCULAR | Status: DC | PRN
  Administered 2014-11-18 (×4): 25 ug via INTRAVENOUS

## 2014-11-18 SURGICAL SUPPLY — 27 items
BANDAGE ELASTIC 4 CLIP NS LF (GAUZE/BANDAGES/DRESSINGS) ×3 IMPLANT
BANDAGE ELASTIC 4 CLIP ST LF (GAUZE/BANDAGES/DRESSINGS) ×3 IMPLANT
BLADE FULL RADIUS 3.5 (BLADE) ×3 IMPLANT
BLADE INCISOR PLUS 4.5 (BLADE) ×3 IMPLANT
BLADE SHAVER 4.5 DBL SERAT CV (CUTTER) ×3 IMPLANT
BLADE SHAVER 4.5X7 STR FR (MISCELLANEOUS) ×3 IMPLANT
CHLORAPREP W/TINT 26ML (MISCELLANEOUS) ×3 IMPLANT
GAUZE PETRO XEROFOAM 1X8 (MISCELLANEOUS) ×3 IMPLANT
GAUZE SPONGE 4X4 12PLY STRL (GAUZE/BANDAGES/DRESSINGS) ×3 IMPLANT
GLOVE BIOGEL PI IND STRL 9 (GLOVE) ×1 IMPLANT
GLOVE BIOGEL PI INDICATOR 9 (GLOVE) ×2
GLOVE SURG ORTHO 9.0 STRL STRW (GLOVE) ×3 IMPLANT
GOWN SPECIALTY ULTRA XL (MISCELLANEOUS) ×3 IMPLANT
GOWN STRL REUS W/ TWL LRG LVL3 (GOWN DISPOSABLE) ×1 IMPLANT
GOWN STRL REUS W/TWL LRG LVL3 (GOWN DISPOSABLE) ×2
IV LACTATED RINGER IRRG 3000ML (IV SOLUTION) ×8
IV LR IRRIG 3000ML ARTHROMATIC (IV SOLUTION) ×4 IMPLANT
KIT RM TURNOVER STRD PROC AR (KITS) ×3 IMPLANT
MANIFOLD NEPTUNE II (INSTRUMENTS) ×3 IMPLANT
PACK ARTHROSCOPY KNEE (MISCELLANEOUS) ×3 IMPLANT
SET TUBE SUCT SHAVER OUTFL 24K (TUBING) ×3 IMPLANT
SET TUBE TIP INTRA-ARTICULAR (MISCELLANEOUS) ×3 IMPLANT
SUT ETHILON 4-0 (SUTURE) ×2
SUT ETHILON 4-0 FS2 18XMFL BLK (SUTURE) ×1
SUTURE ETHLN 4-0 FS2 18XMF BLK (SUTURE) ×1 IMPLANT
TUBING ARTHRO INFLOW-ONLY STRL (TUBING) ×3 IMPLANT
WAND HAND CNTRL MULTIVAC 50 (MISCELLANEOUS) ×3 IMPLANT

## 2014-11-18 NOTE — Anesthesia Preprocedure Evaluation (Signed)
Anesthesia Evaluation  Patient identified by MRN, date of birth, ID band  Reviewed: Allergy & Precautions, NPO status , Patient's Chart, lab work & pertinent test results  Airway Mallampati: II  TM Distance: >3 FB Neck ROM: Full    Dental no notable dental hx. (+) Caps   Pulmonary neg pulmonary ROS,  breath sounds clear to auscultation  Pulmonary exam normal       Cardiovascular Normal cardiovascular exam+ Valvular Problems/Murmurs Rhythm:Regular Rate:Normal  Hx of heart murmur   Neuro/Psych negative neurological ROS  negative psych ROS   GI/Hepatic   Endo/Other  negative endocrine ROS  Renal/GU Renal InsufficiencyRenal disease  negative genitourinary   Musculoskeletal  (+) Arthritis -, Osteoarthritis,    Abdominal Normal abdominal exam  (+)   Peds  Hematology  (+) anemia ,   Anesthesia Other Findings   Reproductive/Obstetrics                             Anesthesia Physical Anesthesia Plan  ASA: II  Anesthesia Plan: General   Post-op Pain Management:    Induction: Intravenous  Airway Management Planned: LMA  Additional Equipment:   Intra-op Plan:   Post-operative Plan: Extubation in OR  Informed Consent: I have reviewed the patients History and Physical, chart, labs and discussed the procedure including the risks, benefits and alternatives for the proposed anesthesia with the patient or authorized representative who has indicated his/her understanding and acceptance.   Dental advisory given  Plan Discussed with:   Anesthesia Plan Comments:         Anesthesia Quick Evaluation

## 2014-11-18 NOTE — Op Note (Signed)
11/18/2014  2:42 PM  PATIENT:  Judith Gomez  44 y.o. female  PRE-OPERATIVE DIAGNOSIS:  medial meniscus tear  POST-OPERATIVE DIAGNOSIS:  medial meniscus tear  PROCEDURE:  Procedure(s) with comments: ARTHROSCOPY KNEE (Left) -  partial medial menisectomy  SURGEON: Laurene Footman, MD  ASSISTANTS: None  ANESTHESIA:   general  EBL:  Total I/O In: 500 [I.V.:500] Out: -   BLOOD ADMINISTERED:none  DRAINS: none   LOCAL MEDICATIONS USED:  MARCAINE     SPECIMEN:  No Specimen  DISPOSITION OF SPECIMEN:  N/A  COUNTS:  YES  TOURNIQUET:   none  IMPLANTS: None  DICTATION: .Dragon Dictation patient was brought to the operating room and after adequate general anesthesia was obtained the left leg was placed in arthroscopic leg holder with a tourniquet applied but not required. After prepping and draping in usual sterile fashion appropriate patient education and timeout procedure were completed. An inferior lateral portal was made and the arthroscope was introduced initial inspection revealed very minimal chondromalacia the patella small plica band medially which was minimal coming around medially there inferior medial portal was made. On probing there was a tear at the junction of the posterior and middle thirds consistent with preoperative MRI this is debrided with use of a meniscal punch and ArthriCare wand to a stable margin. The medial articular cartilage was essentially normal with just some early loss of specimen was superficial layer of cartilage in small areas. The anterior cruciate ligament was intact and lateral compartment was normal gutters were free of any loose bodies. After addressing the medial meniscus tear the knee was irrigated until clear argentation was withdrawn and 20 cc of half percent Sensorcaine were infiltrated near the portals. Xeroform 4 x 4's ABDs and web roll and Ace wrap applied patient sent to recovery in stable condition  PLAN OF CARE: Discharge to home after  PACU  PATIENT DISPOSITION:  PACU - hemodynamically stable.

## 2014-11-18 NOTE — Transfer of Care (Signed)
Immediate Anesthesia Transfer of Care Note  Patient: Judith Gomez  Procedure(s) Performed: Procedure(s) with comments: ARTHROSCOPY KNEE (Left) -  partial medial menisectomy  Patient Location: PACU  Anesthesia Type:General  Level of Consciousness: awake, alert , oriented and patient cooperative  Airway & Oxygen Therapy: Patient Spontanous Breathing and Patient connected to face mask oxygen  Post-op Assessment: Report given to RN, Post -op Vital signs reviewed and stable and Patient moving all extremities  Post vital signs: Reviewed and stable  Last Vitals:  Filed Vitals:   11/18/14 1452  BP: 119/72  Pulse: 88  Temp: 37 C  Resp: 18    Complications: No apparent anesthesia complications

## 2014-11-18 NOTE — Anesthesia Postprocedure Evaluation (Signed)
  Anesthesia Post-op Note  Patient: Judith Gomez  Procedure(s) Performed: Procedure(s) with comments: ARTHROSCOPY KNEE (Left) -  partial medial menisectomy  Anesthesia type:General  Patient location: PACU  Post pain: Pain level controlled  Post assessment: Post-op Vital signs reviewed, Patient's Cardiovascular Status Stable, Respiratory Function Stable, Patent Airway and No signs of Nausea or vomiting  Post vital signs: Reviewed and stable  Last Vitals:  Filed Vitals:   11/18/14 1633  BP: 122/67  Pulse: 60  Temp:   Resp: 16    Level of consciousness: awake, alert  and patient cooperative  Complications: No apparent anesthesia complications

## 2014-11-18 NOTE — Discharge Instructions (Addendum)
Weightbearing as tolerated on left leg. Ice to the knee tonight. Aspirin 81 or 325 mg daily until walking normally.  AMBULATORY SURGERY  DISCHARGE INSTRUCTIONS   1) The drugs that you were given will stay in your system until tomorrow so for the next 24 hours you should not:  A) Drive an automobile B) Make any legal decisions C) Drink any alcoholic beverage   2) You may resume regular meals tomorrow.  Today it is better to start with liquids and gradually work up to solid foods.  You may eat anything you prefer, but it is better to start with liquids, then soup and crackers, and gradually work up to solid foods.   3) Please notify your doctor immediately if you have any unusual bleeding, trouble breathing, redness and pain at the surgery site, drainage, fever, or pain not relieved by medication.   Marland Kitchen                                 4) Additional Instructions: [facility] Number [phone]

## 2014-11-18 NOTE — H&P (Signed)
Reviewed paper H+P, will be scanned into chart. No changes noted.  

## 2014-11-18 NOTE — Anesthesia Procedure Notes (Signed)
Procedure Name: LMA Insertion Date/Time: 11/18/2014 2:06 PM Performed by: Courtney Paris Pre-anesthesia Checklist: Patient identified, Emergency Drugs available, Suction available and Patient being monitored Patient Re-evaluated:Patient Re-evaluated prior to inductionOxygen Delivery Method: Circle system utilized Preoxygenation: Pre-oxygenation with 100% oxygen Intubation Type: IV induction and Combination inhalational/ intravenous induction Ventilation: Mask ventilation without difficulty LMA: LMA inserted LMA Size: 4.0 Grade View: Grade II Number of attempts: 1 Tube secured with: Tape

## 2014-11-24 NOTE — Addendum Note (Signed)
Addendum  created 11/24/14 2008 by Alvin Critchley, MD   Modules edited: Anesthesia Attestations

## 2014-12-17 ENCOUNTER — Ambulatory Visit: Admit: 2014-12-17 | Discharge: 2014-12-17 | Payer: PRIVATE HEALTH INSURANCE

## 2014-12-17 DIAGNOSIS — Z01419 Encounter for gynecological examination (general) (routine) without abnormal findings: Secondary | ICD-10-CM

## 2014-12-17 NOTE — Unmapped (Signed)
Gynecology Annual Exam    HPI: Ms Contino is a 44 y.o. [id number] who presents for annual exam. She has no acute complaints.     Menstrual cycles are regular, monthly. 5d of bleeding. No changes.   She is using nothing for contraception - husband had a vasectomy  Patient has no trouble with intercourse. Feels safe in her relationship  Does not consistently exercise but is active w/ children  Has 3 children of her own; 4 foster children    OB History   Gravida Para Term Preterm AB SAB TAB Ectopic Multiple Living   6 3 3  3 3    3       # Outcome Date GA Lbr Len/2nd Weight Sex Delivery Anes PTL Lv   6 SAB            5 SAB            4 SAB            3 Term         Y   2 Term         Y   1 Term         Y          Preventive History:  Last pap 2014 NILM/HPV neg. Denies hx abnormal paps    Past Medical History   Diagnosis Date   ??? Anxiety    ??? Depression    ??? Melanoma        Past Surgical History   Procedure Laterality Date   ??? Cesarean section  1997   ??? Ear spur  1998     right       History     Social History   ??? Marital Status: Married     Spouse Name: N/A   ??? Number of Children: N/A   ??? Years of Education: N/A     Occupational History   ??? Not on file.     Social History Main Topics   ??? Smoking status: Never Smoker    ??? Smokeless tobacco: Not on file   ??? Alcohol Use: No   ??? Drug Use: No   ??? Sexual Activity:     Partners: Male     Pharmacist, hospital Protection: Surgical     Other Topics Concern   ??? Caffeine Use Yes   ??? Occupational Exposure No   ??? Exercise Yes   ??? Seat Belt Yes     Social History Narrative       Family History   Problem Relation Age of Onset   ??? Cancer Mother      lung cancer   ??? Heart disease Maternal Grandmother    ??? Heart disease Maternal Grandfather        Medications: reviewed medication list in the chart - Lexapro prn; Prednisone currently for stress hives  Allergies: reviewed allergy section in the chart    Review of Systems - General ROS: negative for - chills, fatigue, fever or hot  flashes  Hematological and Lymphatic ROS: negative for - blood clots, night sweats or unexpected weight changes  Breast ROS: negative for breast lumps  Respiratory ROS: no cough, shortness of breath, or wheezing  Cardiovascular ROS: no chest pain or dyspnea on exertion  Gastrointestinal ROS: no abdominal pain, change in bowel habits, or black or bloody stools  Genito-Urinary ROS: no dysuria, trouble voiding, or hematuria  Musculoskeletal ROS: negative for - joint pain, joint stiffness or muscular  weakness  Neurological ROS: no TIA or stroke symptoms    Physical Exam:  BP 120/84 mmHg   Pulse 61   Ht 5' 2.5 (1.588 m)   Wt 197 lb (89.359 kg)   BMI 35.44 kg/m2   SpO2 99%   LMP 12/01/2014 (Approximate)  OBJECTIVE:     Gen: AOx3, NAD.   HEENT: Moist oral mucosa, Neck supple. No thyromegaly or supraclavicular/cervical lymphadenopathy  Lungs: CTAB, no wheezes, rhonchi or rales.  CV: RRR, no murmurs  Abd: soft, non tender, non distended. No guarding, rebound, hernias, masses, or hepatosplenomegaly  Ext: No LE edema, non tender, normal peripheral pulses  Neuro: grossly normal, no focal findings.    BREAST EXAM: breasts appear normal, no suspicious masses, no skin or nipple changes or axillary nodes  PELVIC EXAM:     A/P: 44 y.o. [id number] for annual exam  - Pap/HPV due 2019  - Vasectomy for family planning  - Discussed diet, exercise  - Will have primary MD check TFTs with annual exam in Aug  - FU 1 year    Philipp Ovens, MD, MS

## 2015-07-27 ENCOUNTER — Telehealth

## 2015-07-27 NOTE — Telephone Encounter (Signed)
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 Myeloperoxidase/MPO vascular inflammatory marker 358 is normal (goal <470). Normal MPO is associated with less stroke & MI risk, especially in those with good cholesterol, normal blood pressure, non smokers & not being too overweight. Good lipids. Total cholesterol 200 with low levels of the most atherogenic "bad" LDL 132 (goal <130) & "bad" triglycerides 88 (goal <150) with high protective HDL 50 (goal >40). Total Chol 200/ HDL 50 ratio should at least be <4.5 as yours is at 4.0. Fine lipid balance.   Pg 2: A1C 3 month/ 90 day long term glucose= sugar level 6.3% normal, unchanged from 07/10/14 (normal range 4.7 to 6.4%, ideal <5.7%), no diabetes unless A1C >6.5%. Stay on whatever metformin dose tolerated. Clearly nonadherent with (ie not taking) Vitamin D. Vit D remains very low 19.4, minimally higher from very low Vit D level of 14.7 on 07/10/14 when asked to add 5000 IU or more of OTC Vit D (any kind) daily for good  general health: better immune function, musculoskeletal & cardiovascular health, all proven to benefit from higher Vit D levels. Vit D & B12 are "medicines" for [place] to take until 45yo to maintain good health and functionality (see orange Vit D, B12 handout -again). Sugar=glucose 131 OK (normal fasting <126, nonfasting <160). Kidney=creatinine 0.79, electrolytes & liver ALT 18, AST 19 all normal.   Pg 3: Thyroid=TSH 1.420 normal (TSH is the most important thyroid function test, normal range 0.40 to 4.5 for TSH), well balanced. CBC & WBC normal, no anemia.   Pg 4: "Out of range results" only notable for low Vit D level which will normalize taking 5000 IU OTC Vit D (along with B12  =2mg ) BOTH DAILY, missing no doses. Remainder clinically insignificant.   Your good health is still as much due to your active lifestyle as the healthcare you receive. Aerobic=walking, biking, yardwork & other similar exercise (whatever you enjoy & your body still allows you to do) 3 days every week is essential for natural stress relief, cardiovascular health & high functioning as we age. Stay on all meds, adding & not missing OTC Vit D 5000 IU or more of (any kind) daily for good health & B12 2050mcg=2mg  daily for Neurological health. Re-do exam & blood work annually. Stay active to stay well. Sincerely, Sylvan Cheese MD [phone] CC: Drs. Dumont, Annie Main, [place], L. Power ANP    07/10/14 Labs [phone], 07/10/2014) 07/10/14 Note to Patient Labs in general notable for very low vitamin D level reinforcing need to take both vit D and B12 at doses noted & high normal glucose & A1C.  Results good otherwise.  See notes below.  Pg 1: Myeloperoxidase/MPO vascular inflammatory marker 389 normal. Normal MPO is associated with decreased risk of MI and stroke. Total cholesterol 246 is OK up to <240 if protective HDL 58 is high also as yours is. Moderately high atherogenic "bad" LDL cholesterol 171 is offset by the high protective "good" HDL 58. HDL >60 provides additional protection against stroke & MI. "Bad" Triglycerides low at 90. The total chol 246/ HDL 58 ratio should at least be <4.5 as yours is at 4.21. Fine lipid balance.  Pg 2: lipids of fractions.  Good overall.  Weight loss from 200 lbs now towards <185 lbs goal by 2017 will lower the Apo B (linked to the atherogenic LDL) & LDL itself. A1c 3 month / 90 day long term glucose = sugar level 6.3% high normal (normal range 4.7 to 6.4%, diabetes if >6.5%). No diabetes yet. Elimination of refined sugar=sweets in diet, including nothing but "0 calorie" drinks & water, no sugary fattening fruit juices either, reduction of carbohydrates=starches to a total daily carb gram  limit of 60 or less total grams per day for women (fewer bananas, potatoes, bread, noodles/pasta, etc) eating less food of all kinds and aerobic/walking exercise 40 minutes 3 days every week without excuses is needed to lose weight, prevent diabetes and protect heart. Very low vitamin D level 14.5, reinforces the need to add and never stop both: 5000 IU or more of OTC Vit D every day for good general health including better immune function, musculoskeletal & cardiovascular health, all proven to benefit from higher Vitamin D levels & OTC B12 2034mcg=2mg  daily for Neurological Health, prevention of dementia & energy (read a Diet Red Bull or 5 Hour Energy bottle, both are full of B vitamins). Sugar slightly high underscoring need for weight loss, electrolytes normal.  Pg 3: Creatinine=kidney, electrolytes & liver  all normal. Thyroid=TSH 1.970 normal (TSH is the most important thyroid function lab test, normal range 0.40-4.5 for TSH), well balanced. CBC & WBC normal, no anemia.  Pg 4: CBC & WBC normal, no anemia. Pg 5: "Out of range results" notable for low vitamin D 14.5, reinforcing need to never stop B12 and vitamin D supplements as well as high normal glucose measures (both glucose and A1c) reinforcing need to really focus on diet and exercise discipline.   Your sound health is as much due to your active, moderate lifestyle as the healthcare you receive. Keep it up. Stay on all meds, resuming & not stopping above-noted vit D & B12 supplements. Re-do exam & blood work annually at <185 lbs in 2017. Stay active to stay well. Thank you and Harvie Heck for your patience over this past difficult year.  Sincerely, Sylvan Cheese M.D. [phone], [phone]   07/27/15 Electronically Signed by: Darden Dates, MD [facility] [address] [place], [place] [address] Demetrius Charity [phone], F [phone]

## 2015-08-11 ENCOUNTER — Encounter

## 2015-08-11 ENCOUNTER — Inpatient Hospital Stay: Admit: 2015-08-11 | Attending: Internal Medicine | Primary: Internal Medicine

## 2015-08-11 DIAGNOSIS — Z1231 Encounter for screening mammogram for malignant neoplasm of breast: Secondary | ICD-10-CM

## 2016-02-08 ENCOUNTER — Ambulatory Visit: Admit: 2016-02-08 | Discharge: 2016-02-08 | Payer: PRIVATE HEALTH INSURANCE

## 2016-02-08 DIAGNOSIS — Z01419 Encounter for gynecological examination (general) (routine) without abnormal findings: Secondary | ICD-10-CM

## 2016-02-08 NOTE — Unmapped (Signed)
Gynecology Annual Exam    HPI: Ms Milholland is a 45 y.o. [id number] who presents for annual exam. She has no acute complaints.     Menstrual cycles are regular, monthly. 5d of bleeding. No changes.   She is using nothing for contraception - husband had a vasectomy  Patient has no trouble with intercourse. Feels safe in her relationship  Does not consistently exercise but is active w/ children  Has 3 children of her own; 1 adopted and 3 foster children    OB History   Gravida Para Term Preterm AB SAB TAB Ectopic Multiple Living   6 3 3  3 3    3       # Outcome Date GA Lbr Len/2nd Weight Sex Delivery Anes PTL Lv   6 SAB            5 SAB            4 SAB            3 Term         Y   2 Term         Y   1 Term         Y          Preventive History:  Last pap 2014 NILM/HPV neg. Denies hx abnormal paps  Last mammogram BIRADS 1, 07/2015    Past Medical History   Diagnosis Date   ??? Anxiety    ??? Depression    ??? Melanoma        Past Surgical History   Procedure Laterality Date   ??? Cesarean section  1997   ??? Ear spur  1998     right       Social History     Social History   ??? Marital Status: Married     Spouse Name: N/A   ??? Number of Children: N/A   ??? Years of Education: N/A     Occupational History   ??? Not on file.     Social History Main Topics   ??? Smoking status: Never Smoker    ??? Smokeless tobacco: Never Used   ??? Alcohol Use: No   ??? Drug Use: No   ??? Sexual Activity:     Partners: Male     Pharmacist, hospital Protection: Surgical      Comment: Vasectomy     Other Topics Concern   ??? Caffeine Use Yes   ??? Occupational Exposure No   ??? Exercise Yes   ??? Seat Belt Yes     Social History Narrative       Family History   Problem Relation Age of Onset   ??? Cancer Mother      lung cancer   ??? Heart disease Maternal Grandmother    ??? Heart disease Maternal Grandfather        Medications: reviewed medication list in the chart - Lexapro prn; Prednisone currently for stress hives  Allergies: reviewed allergy section in the chart    Review of Systems -  General ROS: negative for - chills, fatigue, fever or hot flashes  Hematological and Lymphatic ROS: negative for - blood clots, night sweats or unexpected weight changes  Breast ROS: negative for breast lumps  Respiratory ROS: no cough, shortness of breath, or wheezing  Cardiovascular ROS: no chest pain or dyspnea on exertion  Gastrointestinal ROS: no abdominal pain, change in bowel habits, or black or bloody stools  Genito-Urinary ROS: no dysuria,  trouble voiding, or hematuria  Musculoskeletal ROS: negative for - joint pain, joint stiffness or muscular weakness  Neurological ROS: no TIA or stroke symptoms    Physical Exam:  BP 147/79 mmHg   Pulse 71   Ht 5' 3 (1.6 m)   Wt 175 lb (79.379 kg)   BMI 31.01 kg/m2   SpO2 98%   LMP 01/31/2016 (Exact Date)  OBJECTIVE:     Gen: AOx3, NAD.   HEENT: Moist oral mucosa, Neck supple. No thyromegaly or supraclavicular/cervical lymphadenopathy  Lungs: CTAB, no wheezes, rhonchi or rales.  CV: RRR, no murmurs  Abd: soft, non tender, non distended. No guarding, rebound, hernias, masses, or hepatosplenomegaly  Ext: No LE edema, non tender, normal peripheral pulses  Neuro: grossly normal, no focal findings.    BREAST EXAM: breasts appear normal, no suspicious masses, no skin or nipple changes or axillary nodes  PELVIC EXAM: .Normal external genitalia without lesions, including bartholins and skenes glands and urethra. Pink vaginal mucosa with minimal discharge. Cervix grossly normal in appearance. On bimanual exam, anteverted uterus without enlargement, no CMT or adnexal masses. Normal appearing rectum.   No inguinal lymphadenopathy bilaterally.       A/P: 45 y.o. P3033 for annual exam  - Pap/HPV due 2019  - Mammogram up to date  - Vasectomy for family planning  - Discussed diet, exercise. Target weight 160 lbs  - Other routine HCM per primary MD. On metformin.   - FU 1 year    Philipp Ovens, MD, MS

## 2016-08-11 ENCOUNTER — Encounter: Primary: Internal Medicine

## 2016-08-22 ENCOUNTER — Ambulatory Visit: Admit: 2016-08-22 | Primary: Internal Medicine

## 2016-08-22 ENCOUNTER — Inpatient Hospital Stay: Admit: 2016-08-22 | Attending: Internal Medicine | Primary: Internal Medicine

## 2016-08-22 ENCOUNTER — Encounter

## 2016-08-22 DIAGNOSIS — Z1231 Encounter for screening mammogram for malignant neoplasm of breast: Secondary | ICD-10-CM

## 2017-01-10 DIAGNOSIS — C439 Malignant melanoma of skin, unspecified: Secondary | ICD-10-CM | POA: Insufficient documentation

## 2017-02-27 ENCOUNTER — Encounter

## 2017-02-27 ENCOUNTER — Inpatient Hospital Stay: Admit: 2017-02-27 | Payer: BLUE CROSS/BLUE SHIELD | Primary: Internal Medicine

## 2017-02-27 DIAGNOSIS — Z1231 Encounter for screening mammogram for malignant neoplasm of breast: Secondary | ICD-10-CM

## 2017-03-16 ENCOUNTER — Ambulatory Visit: Admit: 2017-03-16 | Discharge: 2017-03-16 | Payer: PRIVATE HEALTH INSURANCE

## 2017-03-16 DIAGNOSIS — Z1239 Encounter for other screening for malignant neoplasm of breast: Secondary | ICD-10-CM

## 2017-03-16 NOTE — Unmapped (Signed)
Gynecology Annual Exam    HPI: Ms Chabot is a 46 y.o. [id number] who presents for annual exam. She has no acute complaints.     Menstrual cycles are regular, monthly. 5d of bleeding. No changes. She has not started having HF or NS. Denies irregular bleeding, vaginal dryness, pelvic or abd pain   She is using nothing for contraception - husband had a vasectomy  Patient has no trouble with intercourse. Feels safe in her relationship  Does not consistently exercise but is active w/ children  Has 2 children of her own; she currently has 2 foster children with her, ages 30 and 2.     OB History   Gravida Para Term Preterm AB Living   6 3 3   3 3    SAB TAB Ectopic Multiple Live Births   3       3      # Outcome Date GA Lbr Len/2nd Weight Sex Delivery Anes PTL Lv   6 SAB            5 SAB            4 SAB            3 Term         LIV   2 Term         LIV   1 Term         LIV          Preventive History:  Last pap 2014 NILM/HPV neg. Denies hx abnormal paps  Last mammogram 02/2017 ACR 3 due to fibroglandular tissues (see CareEverywhere) - recommendation is for follow up bilateral dx mammogram 07/2017.     Past Medical History:   Diagnosis Date   ??? Anxiety    ??? Depression    ??? Melanoma (CMS Dx)        Past Surgical History:   Procedure Laterality Date   ??? CESAREAN SECTION  1997   ??? ear spur  1998    right       Social History     Social History   ??? Marital status: Married     Spouse name: N/A   ??? Number of children: N/A   ??? Years of education: N/A     Occupational History   ??? Not on file.     Social History Main Topics   ??? Smoking status: Never Smoker   ??? Smokeless tobacco: Never Used   ??? Alcohol use No   ??? Drug use: No   ??? Sexual activity: Yes     Partners: Male     Birth control/ protection: Surgical      Comment: Vasectomy     Other Topics Concern   ??? Caffeine Use Yes   ??? Occupational Exposure No   ??? Exercise Yes   ??? Seat Belt Yes     Social History Narrative   ??? No narrative on file       Family History   Problem Relation Age  of Onset   ??? Cancer Mother         lung cancer   ??? Heart disease Maternal Grandmother    ??? Heart disease Maternal Grandfather        Medications: reviewed medication list in the chart - Lexapro prn; Prednisone currently for stress hives  Allergies: reviewed allergy section in the chart    Review of Systems - General ROS: negative for - chills, fatigue, fever or hot flashes  Hematological and Lymphatic ROS: negative for - blood clots, night sweats or unexpected weight changes  Breast ROS: negative for breast lumps  Respiratory ROS: no cough, shortness of breath, or wheezing  Cardiovascular ROS: no chest pain or dyspnea on exertion  Gastrointestinal ROS: no abdominal pain, change in bowel habits, or black or bloody stools  Genito-Urinary ROS: no dysuria, trouble voiding, or hematuria  Musculoskeletal ROS: negative for - joint pain, joint stiffness or muscular weakness  Neurological ROS: no TIA or stroke symptoms    Physical Exam:  BP 131/65 (BP Location: Left arm, Patient Position: Sitting, BP Cuff Size: Large)    Pulse 86    Ht 5' 2 (1.575 m)    Wt 161 lb 3.2 oz (73.1 kg)    LMP 03/02/2017    BMI 29.48 kg/m??   OBJECTIVE:     Gen: AOx3, NAD.   HEENT: Moist oral mucosa, Neck supple. No thyromegaly or supraclavicular/cervical lymphadenopathy  Lungs: CTAB, no wheezes, rhonchi or rales.  CV: RRR, no murmurs  Abd: soft, non tender, non distended. No guarding, rebound, hernias, masses, or hepatosplenomegaly  Ext: No LE edema, non tender, normal peripheral pulses  Neuro: grossly normal, no focal findings.    BREAST EXAM: breasts appear normal, no suspicious masses, no skin or nipple changes or axillary nodes  PELVIC EXAM: .Normal external genitalia without lesions, including bartholins and skenes glands and urethra. Pink vaginal mucosa with minimal discharge. Cervix grossly normal in appearance. On bimanual exam, anteverted uterus without enlargement, no CMT or adnexal masses. Normal appearing rectum.   No inguinal  lymphadenopathy bilaterally.       A/P: 46 y.o. P3033 for annual exam  - Pap/HPV due 2019  - Mammogram up to date; bilateral dx mammogram due 07/2017 (ordered)  - Vasectomy for contraception. No menopausal sx yet.   - Discussed diet, exercise. Target weight 150-160 lbs. Has lost weight from cutting out carbs and will continue to exercise.   - Other routine HCM per primary MD. On metformin for insulin resistance. Unable to see last HgbA1c, but patient thinks it was 6.  - Colonoscopy, shingles vaccine at age 60  - Reminded patient to get flu vaccine  - FU 1 year    Philipp Ovens, MD, MS

## 2017-04-03 ENCOUNTER — Ambulatory Visit
Admission: RE | Admit: 2017-04-03 | Discharge: 2017-04-03 | Disposition: A | Discharge status: Home / Self Care | Payer: BC Managed Care – PPO | Source: Ambulatory Visit | Attending: Nurse Practitioner | Admitting: Nurse Practitioner

## 2017-04-03 ENCOUNTER — Other Ambulatory Visit: Payer: Self-pay | Admitting: Nurse Practitioner

## 2017-04-03 DIAGNOSIS — M19041 Primary osteoarthritis, right hand: Secondary | ICD-10-CM | POA: Insufficient documentation

## 2017-04-03 DIAGNOSIS — M79644 Pain in right finger(s): Secondary | ICD-10-CM | POA: Diagnosis present

## 2017-04-03 DIAGNOSIS — M79641 Pain in right hand: Secondary | ICD-10-CM

## 2017-07-26 NOTE — Unmapped (Signed)
[facility] requesting Mammo order be faxed to them at [phone]  Order faxed successfully

## 2017-07-26 NOTE — Unmapped (Signed)
[facility] is calling. Orders back from October on this patient are for diagnostic mammo but the associated diagnosis code is for screening. Please add a different diagnosis code for this mammo.       Notation appears to indicate this is being ordered as diagnostic r/t recommendations from 02/2017 mammo. Pt has fibrocystic breast tissue. Dx code for this is N60.19. Please fax new orders to: [phone].      Thank You,    Danley Danker, RN  [facility]

## 2017-07-27 NOTE — Telephone Encounter (Signed)
Updated order for diagnostic mammogram was faxed to [facility] [phone].

## 2017-08-07 ENCOUNTER — Other Ambulatory Visit: Payer: Self-pay

## 2017-08-08 ENCOUNTER — Encounter: Payer: Self-pay | Admitting: Nurse Practitioner

## 2017-08-08 ENCOUNTER — Ambulatory Visit (INDEPENDENT_AMBULATORY_CARE_PROVIDER_SITE_OTHER): Payer: BC Managed Care – PPO | Admitting: Nurse Practitioner

## 2017-08-08 VITALS — BP 140/80 | HR 82 | Temp 98.5°F | Resp 16 | Ht 68.0 in | Wt 168.0 lb

## 2017-08-08 DIAGNOSIS — R300 Dysuria: Secondary | ICD-10-CM | POA: Diagnosis not present

## 2017-08-08 DIAGNOSIS — M545 Low back pain: Secondary | ICD-10-CM | POA: Diagnosis not present

## 2017-08-08 DIAGNOSIS — J019 Acute sinusitis, unspecified: Secondary | ICD-10-CM | POA: Diagnosis not present

## 2017-08-08 DIAGNOSIS — N390 Urinary tract infection, site not specified: Secondary | ICD-10-CM

## 2017-08-08 LAB — POCT URINALYSIS DIPSTICK
BILIRUBIN UA: NEGATIVE
Glucose, UA: NEGATIVE
Ketones, UA: NEGATIVE
Nitrite, UA: NEGATIVE
Protein, UA: NEGATIVE
RBC UA: NEGATIVE
Spec Grav, UA: 1.01 (ref 1.010–1.025)
Urobilinogen, UA: 0.2 E.U./dL
pH, UA: 7.5 (ref 5.0–8.0)

## 2017-08-08 MED ORDER — SULFAMETHOXAZOLE-TRIMETHOPRIM 800-160 MG PO TABS: 1.0000 | ORAL_TABLET | Freq: Two times a day (BID) | ORAL | 0 refills | Status: DC

## 2017-08-08 MED ORDER — CARISOPRODOL 250 MG PO TABS: 250.0000 mg | ORAL_TABLET | Freq: Every day | ORAL | 1 refills | Status: DC

## 2017-08-08 NOTE — Progress Notes (Signed)
[facility] [place], [place] [address]  Internal MEDICINE  Office Visit Note  Patient Name: Judith Gomez  [id number]  [id number]  Date of Service: 08/27/2017  Chief Complaint  Patient presents with  . Back Pain  . Headache    3 weeks   . URI     The patient is here for sick visit. Has headache, sore throat, swollen glands. This has been going on for about 2 to 3 weeks. Has taken OTC tylenol and ibuprofen t help this pain and this did not help. Symptoms are persistent.    Back Pain  This is a new problem. The current episode started 1 to 4 weeks ago. The problem occurs constantly. The problem has been gradually worsening since onset. The quality of the pain is described as aching. The pain does not radiate. The pain is moderate. Stiffness is present in the morning. Associated symptoms include headaches. She has tried analgesics and NSAIDs for the symptoms. The treatment provided mild relief.   Pt is here for a sick visit.     Current Medication:  Outpatient Encounter Medications as of 08/08/2017  Medication Sig  . cholecalciferol (VITAMIN D) 1000 UNITS tablet Take 1,000 Units by mouth daily.  Marland Kitchen etodolac (LODINE) 400 MG tablet Take by mouth.  . gabapentin (NEURONTIN) 300 MG capsule Take 300 mg by mouth 2 (two) times daily.  . Multiple Vitamin (MULTIVITAMIN) capsule Take 1 capsule by mouth daily.  . carisoprodol (SOMA) 250 MG tablet Take 1 tablet (250 mg total) by mouth at bedtime.  Marland Kitchen HYDROcodone-acetaminophen (NORCO) 5-325 MG per tablet Take 1 tablet by mouth every 6 (six) hours as needed for moderate pain. (Patient not taking: Reported on 08/08/2017)  . sulfamethoxazole-trimethoprim (BACTRIM DS,SEPTRA DS) 800-160 MG tablet Take 1 tablet by mouth 2 (two) times daily.  . [DISCONTINUED] naproxen (NAPROSYN) 500 MG tablet Take 500 mg by mouth daily at 12 noon.   No facility-administered encounter medications on file as of 08/08/2017.       Medical  History: Past Medical History:  Diagnosis Date  . Anemia   . Arthritis   . Chronic kidney disease   . Heart murmur      Today's Vitals   08/08/17 1419  BP: 140/80  Pulse: 82  Resp: 16  Temp: 98.5 F (36.9 C)  SpO2: 98%  Weight: 168 lb (76.2 kg)  Height: 5\' 8"  (1.727 m)    Review of Systems  Constitutional: Positive for chills and fatigue. Negative for activity change.  HENT: Positive for ear pain, postnasal drip, rhinorrhea, sinus pressure, sinus pain, sore throat and voice change. Negative for congestion.   Eyes: Negative.   Respiratory: Negative for cough and wheezing.   Cardiovascular: Negative.   Gastrointestinal: Negative for constipation, diarrhea, nausea and vomiting.  Endocrine: Positive for cold intolerance.  Genitourinary: Positive for flank pain.  Musculoskeletal: Positive for back pain.  Skin: Negative.   Allergic/Immunologic: Negative.   Neurological: Positive for headaches.  Psychiatric/Behavioral: Negative.     Physical Exam  Constitutional: She is oriented to person, place, and time. She appears well-developed and well-nourished. She appears ill. No distress.  HENT:  Head: Normocephalic and atraumatic.  Right Ear: Tympanic membrane is erythematous. Tympanic membrane mobility is abnormal.  Left Ear: Tympanic membrane is erythematous and bulging.  Nose: Rhinorrhea present. Right sinus exhibits frontal sinus tenderness. Left sinus exhibits frontal sinus tenderness.  Mouth/Throat: Oropharynx is clear and moist. No oropharyngeal exudate.  Eyes: EOM are normal.  Pupils are equal, round, and reactive to light.  Neck: Normal range of motion. Neck supple. No JVD present. No tracheal deviation present. No thyromegaly present.  Cardiovascular: Normal rate, regular rhythm and normal heart sounds. Exam reveals no gallop and no friction rub.  No murmur heard. Pulmonary/Chest: Effort normal and breath sounds normal. No respiratory distress. She has no wheezes. She  has no rales. She exhibits no tenderness.  Abdominal: Soft. Bowel sounds are normal. There is no tenderness.  Genitourinary:  Genitourinary Comments: Urine sample positive for trace WBC  Musculoskeletal:  Moderate lower back pain. Patient grimacing when standing up from seated position. Bending and twisting at the waist increases pain. No visible or palpable abnormalities noted.   Lymphadenopathy:    She has cervical adenopathy.  Neurological: She is alert and oriented to person, place, and time. No cranial nerve deficit.  Skin: Skin is warm and dry. She is not diaphoretic.  Psychiatric: She has a normal mood and affect. Her behavior is normal. Judgment and thought content normal.  Nursing note and vitals reviewed.  Assessment/Plan:  1. Urinary tract infection without hematuria, site unspecified Start bacrim BID for 10 days. adust abx as indicated, based on culture results.  - CULTURE, URINE COMPREHENSIVE - sulfamethoxazole-trimethoprim (BACTRIM DS,SEPTRA DS) 800-160 MG tablet; Take 1 tablet by mouth 2 (two) times daily.  Dispense: 20 tablet; Refill: 0  2. Acute non-recurrent sinusitis, unspecified location Bactrim DS to treat UTI nd sinusitis. Rest and increase fluids. OTC medications to manage symptoms.   3. Low back pain, unspecified back pain laterality, unspecified chronicity, with sciatica presence unspecified Likely related to UTI.  - carisoprodol (SOMA) 250 MG tablet; Take 1 tablet (250 mg total) by mouth at bedtime.  Dispense: 30 tablet; Refill: 1  4. Dysuria - POCT Urinalysis Dipstick   General Counseling: orvilla truett understanding of the findings of todays visit and agrees with plan of treatment. I have discussed any further diagnostic evaluation that may be needed or ordered today. We also reviewed her medications today. she has been encouraged to call the office with any questions or concerns that should arise related to todays visit.   This patient was seen by  Leretha Pol, FNP- C in Collaboration with Dr Lavera Guise as a part of collaborative care agreement    Orders Placed This Encounter  Procedures  . CULTURE, URINE COMPREHENSIVE  . POCT Urinalysis Dipstick    Meds ordered this encounter  Medications  . sulfamethoxazole-trimethoprim (BACTRIM DS,SEPTRA DS) 800-160 MG tablet    Sig: Take 1 tablet by mouth 2 (two) times daily.    Dispense:  20 tablet    Refill:  0    Order Specific Question:   Supervising Provider    Answer:   Lavera Guise [[id number]]  . carisoprodol (SOMA) 250 MG tablet    Sig: Take 1 tablet (250 mg total) by mouth at bedtime.    Dispense:  30 tablet    Refill:  1    Order Specific Question:   Supervising Provider    Answer:   Lavera Guise [[id number]]    Time spent: 15 Minutes

## 2017-08-11 ENCOUNTER — Encounter: Payer: Self-pay | Admitting: Nurse Practitioner

## 2017-08-11 ENCOUNTER — Ambulatory Visit: Payer: BC Managed Care – PPO | Admitting: Nurse Practitioner

## 2017-08-11 VITALS — BP 130/80 | HR 71 | Resp 16 | Ht 68.0 in | Wt 168.8 lb

## 2017-08-11 DIAGNOSIS — Z1231 Encounter for screening mammogram for malignant neoplasm of breast: Secondary | ICD-10-CM

## 2017-08-11 DIAGNOSIS — Z0001 Encounter for general adult medical examination with abnormal findings: Secondary | ICD-10-CM

## 2017-08-11 DIAGNOSIS — Z1239 Encounter for other screening for malignant neoplasm of breast: Secondary | ICD-10-CM

## 2017-08-11 DIAGNOSIS — M19041 Primary osteoarthritis, right hand: Secondary | ICD-10-CM

## 2017-08-11 DIAGNOSIS — M19042 Primary osteoarthritis, left hand: Secondary | ICD-10-CM | POA: Diagnosis not present

## 2017-08-11 LAB — CULTURE, URINE COMPREHENSIVE

## 2017-08-11 NOTE — Progress Notes (Signed)
[facility] [address], [place] [address]  Internal MEDICINE  Office Visit Note  Patient Name: Judith Gomez  [id number]  [id number]  Date of Service: 08/27/2017  Chief Complaint  Patient presents with  . Osteoarthritis    most severe in both hands      The patient is here for routine health maintenance exam. She has pain in both hands. Starting to develop deformities in the fingers of both hands. They hurt, especially when she bumps them. Does have RA. Severe rheumatoid and osteoarthritis and had to come off of biologic due to development of melanoma. Fingers starting to develop deformities due to arthtiritc changes Was seen earlier this week and treated for UTI and sinusitis. She is still on antibiotics, started 08/08/2017. Is feelinga little better, but still has dull headache and ears hurt some as well. Low back pain and urinary symptoms have nearly resolved.    Pt is here for routine health maintenance examination  Current Medication: Outpatient Encounter Medications as of 08/11/2017  Medication Sig  . carisoprodol (SOMA) 250 MG tablet Take 1 tablet (250 mg total) by mouth at bedtime.  . cholecalciferol (VITAMIN D) 1000 UNITS tablet Take 1,000 Units by mouth daily.  Marland Kitchen etodolac (LODINE) 400 MG tablet Take by mouth.  . gabapentin (NEURONTIN) 300 MG capsule Take 300 mg by mouth 2 (two) times daily.  Marland Kitchen HYDROcodone-acetaminophen (NORCO) 5-325 MG per tablet Take 1 tablet by mouth every 6 (six) hours as needed for moderate pain. (Patient not taking: Reported on 08/08/2017)  . Multiple Vitamin (MULTIVITAMIN) capsule Take 1 capsule by mouth daily.  Marland Kitchen sulfamethoxazole-trimethoprim (BACTRIM DS,SEPTRA DS) 800-160 MG tablet Take 1 tablet by mouth 2 (two) times daily.   No facility-administered encounter medications on file as of 08/11/2017.     Surgical History: Past Surgical History:  Procedure Laterality Date  . CESAREAN SECTION    . COLONOSCOPY    . KNEE  ARTHROSCOPY Left 11/18/2014   Procedure: ARTHROSCOPY KNEE;  Surgeon: Hessie Knows, MD;  Location: [facility] ORS;  Service: Orthopedics;  Laterality: Left;   partial medial menisectomy  . THUMB FUSION Left 2014  . TUBAL LIGATION    . WISDOM TOOTH EXTRACTION      Medical History: Past Medical History:  Diagnosis Date  . Anemia   . Arthritis   . Chronic kidney disease   . Heart murmur     Family History: No family history on file.    Review of Systems  Constitutional: Positive for chills and fatigue. Negative for activity change.  HENT: Positive for ear pain, postnasal drip, rhinorrhea, sinus pressure, sinus pain, sore throat and voice change. Negative for congestion.   Eyes: Negative.   Respiratory: Negative for cough and wheezing.   Cardiovascular: Negative.   Gastrointestinal: Negative for constipation, diarrhea, nausea and vomiting.  Endocrine: Positive for cold intolerance.  Genitourinary: Positive for flank pain.  Musculoskeletal: Positive for back pain.  Skin: Negative.   Allergic/Immunologic: Negative.   Neurological: Positive for headaches.  Psychiatric/Behavioral: Negative.     Today's Vitals   08/11/17 0935  BP: 130/80  Pulse: 71  Resp: 16  SpO2: 96%  Weight: 168 lb 12.8 oz (76.6 kg)  Height: 5\' 8"  (1.727 m)    Physical Exam  Constitutional: She is oriented to person, place, and time. She appears well-developed and well-nourished. She appears ill. No distress.  HENT:  Head: Normocephalic and atraumatic.  Right Ear: Tympanic membrane is erythematous. Tympanic membrane mobility is abnormal.  Left Ear: Tympanic membrane is erythematous and bulging.  Nose: Rhinorrhea present. Right sinus exhibits frontal sinus tenderness. Left sinus exhibits frontal sinus tenderness.  Mouth/Throat: Oropharynx is clear and moist. No oropharyngeal exudate.  Eyes: EOM are normal. Pupils are equal, round, and reactive to light.  Neck: Normal range of motion. Neck supple. No JVD  present. No tracheal deviation present. No thyromegaly present.  Cardiovascular: Normal rate, regular rhythm and normal heart sounds. Exam reveals no gallop and no friction rub.  No murmur heard. Pulmonary/Chest: Effort normal and breath sounds normal. No respiratory distress. She has no wheezes. She has no rales. She exhibits no tenderness.  Abdominal: Soft. Bowel sounds are normal. There is no tenderness.  Genitourinary:  Genitourinary Comments: Urine sample positive for trace WBC  Musculoskeletal:  Moderate lower back pain. Patient grimacing when standing up from seated position. Bending and twisting at the waist increases pain. No visible or palpable abnormalities noted.   Lymphadenopathy:    She has cervical adenopathy.  Neurological: She is alert and oriented to person, place, and time. No cranial nerve deficit.  Skin: Skin is warm and dry. She is not diaphoretic.  Psychiatric: She has a normal mood and affect. Her behavior is normal. Judgment and thought content normal.  Nursing note and vitals reviewed.    LABS: Recent Results (from the past 2160 hour(s))  POCT Urinalysis Dipstick     Status: Abnormal   Collection Time: 08/08/17  2:31 PM  Result Value Ref Range   Color, UA     Clarity, UA     Glucose, UA neg    Bilirubin, UA neg    Ketones, UA neg    Spec Grav, UA 1.010 1.010 - 1.025   Blood, UA neg    pH, UA 7.5 5.0 - 8.0   Protein, UA neg    Urobilinogen, UA 0.2 0.2 or 1.0 E.U./dL   Nitrite, UA neg    Leukocytes, UA Trace (A) Negative   Appearance     Odor    CULTURE, URINE COMPREHENSIVE     Status: Abnormal   Collection Time: 08/08/17  3:45 PM  Result Value Ref Range   Urine Culture, Comprehensive Final report (A)    Organism ID, Bacteria Comment (A)     Comment: Beta hemolytic Streptococcus, group B 25,000-50,000 colony forming units per mL Penicillin and ampicillin are drugs of choice for treatment of beta-hemolytic streptococcal infections. Susceptibility  testing of penicillins and other beta-lactam agents approved by the FDA for treatment of beta-hemolytic streptococcal infections need not be performed routinely because nonsusceptible isolates are extremely rare in any beta-hemolytic streptococcus and have not been reported for Streptococcus pyogenes (group A). (CLSI)     Assessment/Plan: 1. Encounter for general adult medical examination with abnormal findings Annnual health maintenance exam today  2. Primary osteoarthritis of hands, bilateral Continue NSAIDs as needed annd as prescribed. Will reffer to hand specialist, orthopedics, for further evaluation and treatment.  - Ambulatory referral to Orthopedic Surgery  3. Screening for breast cancer - MM DIGITAL SCREENING BILATERAL; Future  General Counseling: marzella miracle understanding of the findings of todays visit and agrees with plan of treatment. I have discussed any further diagnostic evaluation that may be needed or ordered today. We also reviewed her medications today. she has been encouraged to call the office with any questions or concerns that should arise related to todays visit.  This patient was seen by Leretha Pol, FNP- C in Collaboration with Dr Lavera Guise as  a part of collaborative care agreement    Orders Placed This Encounter  Procedures  . MM DIGITAL SCREENING BILATERAL  . Ambulatory referral to Orthopedic Surgery      Time spent: [place], MD  Internal Medicine

## 2017-08-27 DIAGNOSIS — M19041 Primary osteoarthritis, right hand: Secondary | ICD-10-CM | POA: Insufficient documentation

## 2017-08-27 DIAGNOSIS — M19042 Primary osteoarthritis, left hand: Secondary | ICD-10-CM

## 2017-08-28 ENCOUNTER — Encounter

## 2017-08-28 ENCOUNTER — Inpatient Hospital Stay: Admit: 2017-08-28 | Payer: BLUE CROSS/BLUE SHIELD | Primary: Internal Medicine

## 2017-08-28 DIAGNOSIS — Z1231 Encounter for screening mammogram for malignant neoplasm of breast: Secondary | ICD-10-CM

## 2017-08-28 DIAGNOSIS — N6459 Other signs and symptoms in breast: Secondary | ICD-10-CM

## 2017-09-07 ENCOUNTER — Ambulatory Visit
Admission: RE | Admit: 2017-09-07 | Discharge: 2017-09-07 | Disposition: A | Discharge status: Home / Self Care | Payer: BC Managed Care – PPO | Source: Ambulatory Visit | Attending: Nurse Practitioner | Admitting: Nurse Practitioner

## 2017-09-07 DIAGNOSIS — Z1231 Encounter for screening mammogram for malignant neoplasm of breast: Secondary | ICD-10-CM | POA: Insufficient documentation

## 2017-09-07 DIAGNOSIS — Z1239 Encounter for other screening for malignant neoplasm of breast: Secondary | ICD-10-CM

## 2017-09-18 DIAGNOSIS — M79641 Pain in right hand: Secondary | ICD-10-CM | POA: Insufficient documentation

## 2017-09-19 ENCOUNTER — Telehealth: Payer: Self-pay

## 2017-09-19 NOTE — Telephone Encounter (Signed)
Pt called requesting mammogram results.  Gave results to pt.  dbs

## 2017-10-17 ENCOUNTER — Other Ambulatory Visit: Payer: Self-pay | Admitting: Nurse Practitioner

## 2017-10-18 LAB — COMPREHENSIVE METABOLIC PANEL
ALBUMIN: 4.2 g/dL (ref 3.5–5.5)
ALK PHOS: 69 IU/L (ref 39–117)
ALT: 13 IU/L (ref 0–32)
AST: 17 IU/L (ref 0–40)
Albumin/Globulin Ratio: 2 (ref 1.2–2.2)
BILIRUBIN TOTAL: 0.5 mg/dL (ref 0.0–1.2)
BUN / CREAT RATIO: 14 (ref 9–23)
BUN: 11 mg/dL (ref 6–24)
CHLORIDE: 102 mmol/L (ref 96–106)
CO2: 24 mmol/L (ref 20–29)
CREATININE: 0.76 mg/dL (ref 0.57–1.00)
Calcium: 9.3 mg/dL (ref 8.7–10.2)
GFR calc Af Amer: 107 mL/min/{1.73_m2} (ref >59)
GFR calc non Af Amer: 93 mL/min/{1.73_m2} (ref >59)
GLOBULIN, TOTAL: 2.1 g/dL (ref 1.5–4.5)
Glucose: 109 mg/dL — ABNORMAL HIGH (ref 65–99)
Potassium: 4.4 mmol/L (ref 3.5–5.2)
SODIUM: 141 mmol/L (ref 134–144)
Total Protein: 6.3 g/dL (ref 6.0–8.5)

## 2017-10-18 LAB — LIPID PANEL W/O CHOL/HDL RATIO
Cholesterol, Total: 203 mg/dL — ABNORMAL HIGH (ref 100–199)
HDL: 60 mg/dL (ref >39)
LDL Calculated: 123 mg/dL — ABNORMAL HIGH (ref 0–99)
Triglycerides: 100 mg/dL (ref 0–149)
VLDL Cholesterol Cal: 20 mg/dL (ref 5–40)

## 2017-10-18 LAB — CBC
Hematocrit: 36.2 % (ref 34.0–46.6)
Hemoglobin: 12.7 g/dL (ref 11.1–15.9)
MCH: 30.5 pg (ref 26.6–33.0)
MCHC: 35.1 g/dL (ref 31.5–35.7)
MCV: 87 fL (ref 79–97)
PLATELETS: 242 10*3/uL (ref 150–379)
RBC: 4.17 x10E6/uL (ref 3.77–5.28)
RDW: 13.5 % (ref 12.3–15.4)
WBC: 3.6 10*3/uL (ref 3.4–10.8)

## 2017-10-18 LAB — TSH: TSH: 4.49 u[IU]/mL (ref 0.450–4.500)

## 2017-10-18 LAB — VITAMIN D 25 HYDROXY (VIT D DEFICIENCY, FRACTURES): Vit D, 25-Hydroxy: 32.9 ng/mL (ref 30.0–100.0)

## 2017-10-18 LAB — T4, FREE: Free T4: 1.03 ng/dL (ref 0.82–1.77)

## 2017-10-20 NOTE — Progress Notes (Signed)
Called pt and lmom saying all her labs are good.

## 2017-12-15 DIAGNOSIS — M7551 Bursitis of right shoulder: Secondary | ICD-10-CM | POA: Insufficient documentation

## 2017-12-22 ENCOUNTER — Encounter: Payer: Self-pay | Admitting: Nurse Practitioner

## 2017-12-22 ENCOUNTER — Ambulatory Visit: Payer: BC Managed Care – PPO | Admitting: Nurse Practitioner

## 2017-12-22 ENCOUNTER — Encounter (INDEPENDENT_AMBULATORY_CARE_PROVIDER_SITE_OTHER): Payer: Self-pay

## 2017-12-22 VITALS — BP 119/77 | HR 75 | Resp 16 | Ht 68.0 in | Wt 160.0 lb

## 2017-12-22 DIAGNOSIS — F411 Generalized anxiety disorder: Secondary | ICD-10-CM | POA: Diagnosis not present

## 2017-12-22 DIAGNOSIS — R079 Chest pain, unspecified: Secondary | ICD-10-CM | POA: Diagnosis not present

## 2017-12-22 DIAGNOSIS — H60503 Unspecified acute noninfective otitis externa, bilateral: Secondary | ICD-10-CM

## 2017-12-22 MED ORDER — BUSPIRONE HCL 10 MG PO TABS
ORAL_TABLET | ORAL | 2 refills | Status: DC
Start: 2017-12-22 — End: 2018-03-21

## 2017-12-22 MED ORDER — CIPROFLOXACIN HCL 0.2 % OT SOLN: OTIC | 0 refills | Status: DC

## 2017-12-22 NOTE — Progress Notes (Signed)
[facility] [place], [place] [address]  Internal MEDICINE  Office Visit Note  Patient Name: Judith Gomez  [id number]  [id number]  Date of Service: 01/17/2018   Pt is here for a sick visit.   Chief Complaint  Patient presents with  . Anxiety    USUALLY STARTS AROUND THE SAME TIME DAILY AND FEELS AS THOUGH HEART IS OFF RHYTHM  . Stress  . Ankle Injury    happend july 4th  . Numbness    right side of head and ear.     The patient has had a great deal of family stress. Her father passed away memorial day weekend. His brother passed away this past week. During the funeral of her father, she was confronted with information about her husband which will change her marriage and her life. Since this, she is frequency having panic attacks. She has been trying to deal with this on her own. She has been trying deep breathing, meditation, and relaxation techniques. Really not helping at all. Having tingling in the right side of her head and right ear. Correlates with panic attacks.  She also fell on July 4th and twisted her ankle. Has been wearing a walking boot. Has been icing the ankle and resting it. Very black and blue and sore, but gradually getting better.       Current Medication:  Outpatient Encounter Medications as of 12/22/2017  Medication Sig  . cholecalciferol (VITAMIN D) 1000 UNITS tablet Take 1,000 Units by mouth daily.  . magnesium 30 MG tablet Take 30 mg by mouth 2 (two) times daily.  . Multiple Vitamin (MULTIVITAMIN) capsule Take 1 capsule by mouth daily.  . busPIRone (BUSPAR) 10 MG tablet Take 1/2 to 1 tablet po BID prn anxiety  . carisoprodol (SOMA) 250 MG tablet Take 1 tablet (250 mg total) by mouth at bedtime. (Patient not taking: Reported on 12/22/2017)  . Ciprofloxacin HCl 0.2 % otic solution Insert 4 drops in both ears twice daily for 7 days  . etodolac (LODINE) 400 MG tablet Take by mouth.  . Fluocinolone Acetonide 0.01 % OIL Place 5  drops in ear(s) 2 (two) times daily.  Marland Kitchen gabapentin (NEURONTIN) 300 MG capsule Take 300 mg by mouth 2 (two) times daily.  Marland Kitchen HYDROcodone-acetaminophen (NORCO) 5-325 MG per tablet Take 1 tablet by mouth every 6 (six) hours as needed for moderate pain. (Patient not taking: Reported on 08/08/2017)  . [DISCONTINUED] azithromycin (ZITHROMAX) 250 MG tablet z-pack - take as directed for 5 days  . [DISCONTINUED] sulfamethoxazole-trimethoprim (BACTRIM DS,SEPTRA DS) 800-160 MG tablet Take 1 tablet by mouth 2 (two) times daily. (Patient not taking: Reported on 12/22/2017)   No facility-administered encounter medications on file as of 12/22/2017.       Medical History: Past Medical History:  Diagnosis Date  . Anemia   . Arthritis   . Chronic kidney disease   . Heart murmur      Today's Vitals   12/22/17 1017  BP: 119/77  Pulse: 75  Resp: 16  SpO2: 98%  Weight: 160 lb (72.6 kg)  Height: 5\' 8"  (1.727 m)     Review of Systems  Constitutional: Positive for fatigue. Negative for activity change, chills and unexpected weight change.  HENT: Positive for ear pain, postnasal drip, rhinorrhea and sinus pressure. Negative for congestion, sinus pain, sore throat and voice change.   Eyes: Negative.   Respiratory: Negative for cough, chest tightness, shortness of breath and wheezing.   Cardiovascular:  Positive for palpitations. Negative for chest pain.  Gastrointestinal: Positive for nausea. Negative for constipation, diarrhea and vomiting.  Endocrine: Positive for cold intolerance. Negative for heat intolerance, polydipsia, polyphagia and polyuria.  Genitourinary: Negative.  Negative for flank pain.  Musculoskeletal: Positive for back pain.  Skin: Negative for rash.  Allergic/Immunologic: Negative.   Neurological: Positive for headaches.  Hematological: Negative for adenopathy.  Psychiatric/Behavioral: Positive for dysphoric mood and sleep disturbance. The patient is nervous/anxious.     Physical  Exam  Constitutional: She is oriented to person, place, and time. She appears well-developed and well-nourished. She appears ill. No distress.  HENT:  Head: Normocephalic and atraumatic.  Right Ear: Tympanic membrane is erythematous and bulging. Tympanic membrane mobility is normal.  Left Ear: Tympanic membrane is erythematous and bulging.  Nose: Rhinorrhea present. Right sinus exhibits frontal sinus tenderness. Left sinus exhibits frontal sinus tenderness.  Mouth/Throat: Oropharynx is clear and moist. No oropharyngeal exudate.  Eyes: Pupils are equal, round, and reactive to light. Conjunctivae and EOM are normal.  Neck: Normal range of motion. Neck supple. No JVD present. No tracheal deviation present. No thyromegaly present.  Cardiovascular: Normal rate, regular rhythm and normal heart sounds. Exam reveals no gallop and no friction rub.  No murmur heard. Pulmonary/Chest: Effort normal and breath sounds normal. No respiratory distress. She has no wheezes. She has no rales. She exhibits no tenderness.  Abdominal: Soft. Bowel sounds are normal. There is no tenderness.  Lymphadenopathy:    She has no cervical adenopathy.  Neurological: She is alert and oriented to person, place, and time. No cranial nerve deficit.  Skin: Skin is warm and dry. She is not diaphoretic.  Psychiatric: Her speech is normal and behavior is normal. Judgment and thought content normal. Her mood appears anxious. Cognition and memory are normal. She exhibits a depressed mood.  Patient tearful throughout the visit.   Nursing note and vitals reviewed.  Assessment/Plan: 1. Chest pain, unspecified type ECG showing low voltage precordial leads. Likely normal variation. Symptoms likely from anxiety and recent emotional stress. Will monitor closely.  - EKG 12-Lead  2. Generalized anxiety disorder - busPIRone (BUSPAR) 10 MG tablet; Take 1/2 to 1 tablet po BID prn anxiety  Dispense: 60 tablet; Refill: 2  3. Acute otitis  externa of both ears, unspecified type Add z-pack - take as directed for 5 days.  - Ciprofloxacin HCl 0.2 % otic solution; Insert 4 drops in both ears twice daily for 7 days  Dispense: 14 vial; Refill: 0 - Fluocinolone Acetonide 0.01 % OIL; Place 5 drops in ear(s) 2 (two) times daily.  Dispense: 20 mL; Refill: 0  General Counseling: ashly yepez understanding of the findings of todays visit and agrees with plan of treatment. I have discussed any further diagnostic evaluation that may be needed or ordered today. We also reviewed her medications today. she has been encouraged to call the office with any questions or concerns that should arise related to todays visit.    Counseling:  This patient was seen by Leretha Pol FNP Collaboration with Dr Lavera Guise as a part of collaborative care agreement   Orders Placed This Encounter  Procedures  . EKG 12-Lead    Meds ordered this encounter  Medications  . busPIRone (BUSPAR) 10 MG tablet    Sig: Take 1/2 to 1 tablet po BID prn anxiety    Dispense:  60 tablet    Refill:  2    Order Specific Question:   Supervising Provider  Answer:   Lavera Guise [place]  . Ciprofloxacin HCl 0.2 % otic solution    Sig: Insert 4 drops in both ears twice daily for 7 days    Dispense:  14 vial    Refill:  0    Order Specific Question:   Supervising Provider    Answer:   Lavera Guise [place]  . DISCONTD: azithromycin (ZITHROMAX) 250 MG tablet    Sig: z-pack - take as directed for 5 days    Dispense:  6 tablet    Refill:  0    Order Specific Question:   Supervising Provider    Answer:   Lavera Guise [place]  . Fluocinolone Acetonide 0.01 % OIL    Sig: Place 5 drops in ear(s) 2 (two) times daily.    Dispense:  20 mL    Refill:  0    Order Specific Question:   Supervising Provider    Answer:   Lavera Guise [[id number]]    Time spent: 30 Minutes

## 2018-01-03 ENCOUNTER — Telehealth: Payer: Self-pay

## 2018-01-03 MED ORDER — FLUOCINOLONE ACETONIDE 0.01 % OT OIL: 5.0000 [drp] | TOPICAL_OIL | Freq: Two times a day (BID) | OTIC | 0 refills | Status: DC

## 2018-01-03 MED ORDER — AZITHROMYCIN 250 MG PO TABS: ORAL_TABLET | ORAL | 0 refills | Status: DC

## 2018-01-03 NOTE — Telephone Encounter (Signed)
Pt called her right her clogged and sinus pressure and sore throat as per heather we send zpak and steroids ear drops she don't feel better she can she Ent

## 2018-01-12 ENCOUNTER — Encounter: Payer: Self-pay | Admitting: Adult Health

## 2018-01-12 ENCOUNTER — Ambulatory Visit: Payer: BC Managed Care – PPO | Admitting: Adult Health

## 2018-01-12 VITALS — BP 118/80 | HR 63 | Temp 97.5°F | Resp 16 | Ht 68.0 in | Wt 160.0 lb

## 2018-01-12 DIAGNOSIS — J019 Acute sinusitis, unspecified: Secondary | ICD-10-CM | POA: Diagnosis not present

## 2018-01-12 DIAGNOSIS — H6690 Otitis media, unspecified, unspecified ear: Secondary | ICD-10-CM | POA: Diagnosis not present

## 2018-01-12 MED ORDER — AMOXICILLIN-POT CLAVULANATE 875-125 MG PO TABS
1.0000 | ORAL_TABLET | Freq: Two times a day (BID) | ORAL | 0 refills | Status: DC
Start: 2018-01-12 — End: 2018-04-05

## 2018-01-12 NOTE — Patient Instructions (Signed)
Otitis Media, Adult Otitis media is redness, soreness, and puffiness (swelling) in the space just behind your eardrum (middle ear). It may be caused by allergies or infection. It often happens along with a cold. Follow these instructions at home:  Take your medicine as told. Finish it even if you start to feel better.  Only take over-the-counter or prescription medicines for pain, discomfort, or fever as told by your doctor.  Follow up with your doctor as told. Contact a doctor if:  You have otitis media only in one ear, or bleeding from your nose, or both.  You notice a lump on your neck.  You are not getting better in 3-5 days.  You feel worse instead of better. Get help right away if:  You have pain that is not helped with medicine.  You have puffiness, redness, or pain around your ear.  You get a stiff neck.  You cannot move part of your face (paralysis).  You notice that the bone behind your ear hurts when you touch it. This information is not intended to replace advice given to you by your health care provider. Make sure you discuss any questions you have with your health care provider. Document Released: 11/16/2007 Document Revised: 11/05/2015 Document Reviewed: 12/25/2012 Elsevier Interactive Patient Education  2017 Elsevier Inc.  

## 2018-01-12 NOTE — Progress Notes (Signed)
[facility] [place], [place] [address]  Internal MEDICINE  Office Visit Note  Patient Name: Judith Gomez  [id number]  [id number]  Date of Service: 01/12/2018  Chief Complaint  Patient presents with  . Ear Pain    right ear Pt saw heather few weeks ago and also we send ear drops and zpak  . Headache     HPI Pt is here for a sick visit.  She reports Ear infection recently that was treated.  It did not resolve with treatment.  She then developed sinus infection symptoms for which she received a Z-pak for.  She finished the Z-pak a few days ago.  She continues to reports sinus headaches, sinus and ear pressure.  She denies fever, chills, or vomiting.       Current Medication:  Outpatient Encounter Medications as of 01/12/2018  Medication Sig  . Ciprofloxacin HCl 0.2 % otic solution Insert 4 drops in both ears twice daily for 7 days  . busPIRone (BUSPAR) 10 MG tablet Take 1/2 to 1 tablet po BID prn anxiety  . carisoprodol (SOMA) 250 MG tablet Take 1 tablet (250 mg total) by mouth at bedtime. (Patient not taking: Reported on 12/22/2017)  . cholecalciferol (VITAMIN D) 1000 UNITS tablet Take 1,000 Units by mouth daily.  Marland Kitchen etodolac (LODINE) 400 MG tablet Take by mouth.  . Fluocinolone Acetonide 0.01 % OIL Place 5 drops in ear(s) 2 (two) times daily.  Marland Kitchen gabapentin (NEURONTIN) 300 MG capsule Take 300 mg by mouth 2 (two) times daily.  Marland Kitchen HYDROcodone-acetaminophen (NORCO) 5-325 MG per tablet Take 1 tablet by mouth every 6 (six) hours as needed for moderate pain. (Patient not taking: Reported on 08/08/2017)  . magnesium 30 MG tablet Take 30 mg by mouth 2 (two) times daily.  . Multiple Vitamin (MULTIVITAMIN) capsule Take 1 capsule by mouth daily.  . [DISCONTINUED] azithromycin (ZITHROMAX) 250 MG tablet z-pack - take as directed for 5 days  . [DISCONTINUED] sulfamethoxazole-trimethoprim (BACTRIM DS,SEPTRA DS) 800-160 MG tablet Take 1 tablet by mouth 2 (two) times daily.  (Patient not taking: Reported on 12/22/2017)   No facility-administered encounter medications on file as of 01/12/2018.       Medical History: Past Medical History:  Diagnosis Date  . Anemia   . Arthritis   . Chronic kidney disease   . Heart murmur      Vital Signs: BP 118/80   Pulse 63   Temp (!) 97.5 F (36.4 C)   Resp 16   Ht 5\' 8"  (1.727 m)   Wt 160 lb (72.6 kg)   SpO2 98%   BMI 24.33 kg/m    Review of Systems  Constitutional: Negative for chills, fatigue and unexpected weight change.  HENT: Positive for congestion, ear pain, postnasal drip, sinus pressure and sinus pain. Negative for rhinorrhea, sneezing and sore throat.   Eyes: Negative for photophobia, pain and redness.  Respiratory: Negative for cough, chest tightness and shortness of breath.   Cardiovascular: Negative for chest pain and palpitations.  Gastrointestinal: Negative for abdominal pain, constipation, diarrhea, nausea and vomiting.  Endocrine: Negative.   Genitourinary: Negative for dysuria and frequency.  Musculoskeletal: Negative for arthralgias, back pain, joint swelling and neck pain.  Skin: Negative for rash.  Allergic/Immunologic: Negative.   Neurological: Negative for tremors and numbness.  Hematological: Negative for adenopathy. Does not bruise/bleed easily.  Psychiatric/Behavioral: Negative for behavioral problems and sleep disturbance. The patient is not nervous/anxious.     Physical Exam  Constitutional: She is oriented to person, place, and time. She appears well-developed and well-nourished. No distress.  HENT:  Head: Normocephalic and atraumatic.  Mouth/Throat: Oropharynx is clear and moist. No oropharyngeal exudate.  Eyes: Pupils are equal, round, and reactive to light. EOM are normal.  Neck: Normal range of motion. Neck supple. No JVD present. No tracheal deviation present. No thyromegaly present.  Cardiovascular: Normal rate, regular rhythm and normal heart sounds. Exam reveals no  gallop and no friction rub.  No murmur heard. Pulmonary/Chest: Effort normal and breath sounds normal. No respiratory distress. She has no wheezes. She has no rales. She exhibits no tenderness.  Abdominal: Soft. There is no tenderness. There is no guarding.  Musculoskeletal: Normal range of motion.  Lymphadenopathy:    She has no cervical adenopathy.  Neurological: She is alert and oriented to person, place, and time. No cranial nerve deficit.  Skin: Skin is warm and dry. She is not diaphoretic.  Psychiatric: She has a normal mood and affect. Her behavior is normal. Judgment and thought content normal.  Nursing note and vitals reviewed.  Assessment/Plan: 1. Acute otitis media, unspecified otitis media type Continue to use sudafed and Flonase to help with decongestant.    - amoxicillin-clavulanate (AUGMENTIN) 875-125 MG tablet; Take 1 tablet by mouth 2 (two) times daily.  Dispense: 20 tablet; Refill: 0  2. Acute non-recurrent sinusitis, unspecified location Take antibiotics as directed.     General Counseling: suzzette Gomez understanding of the findings of todays visit and agrees with plan of treatment. I have discussed any further diagnostic evaluation that may be needed or ordered today. We also reviewed her medications today. she has been encouraged to call the office with any questions or concerns that should arise related to todays visit.   No orders of the defined types were placed in this encounter.   No orders of the defined types were placed in this encounter.   Time spent: 25 Minutes  This patient was seen by Orson Gear AGNP-C in Collaboration with Dr Lavera Guise as a part of collaborative care agreement

## 2018-01-17 DIAGNOSIS — F411 Generalized anxiety disorder: Secondary | ICD-10-CM | POA: Insufficient documentation

## 2018-01-17 DIAGNOSIS — H60503 Unspecified acute noninfective otitis externa, bilateral: Secondary | ICD-10-CM | POA: Insufficient documentation

## 2018-01-17 DIAGNOSIS — R079 Chest pain, unspecified: Secondary | ICD-10-CM | POA: Insufficient documentation

## 2018-01-18 ENCOUNTER — Encounter: Payer: Self-pay | Admitting: Adult Health

## 2018-02-15 ENCOUNTER — Ambulatory Visit: Payer: Self-pay | Admitting: Nurse Practitioner

## 2018-02-20 ENCOUNTER — Encounter

## 2018-02-20 ENCOUNTER — Inpatient Hospital Stay: Admit: 2018-02-20 | Payer: BLUE CROSS/BLUE SHIELD | Primary: Internal Medicine

## 2018-02-20 DIAGNOSIS — N6459 Other signs and symptoms in breast: Secondary | ICD-10-CM

## 2018-03-13 ENCOUNTER — Encounter: Payer: Self-pay | Admitting: Adult Health

## 2018-03-13 ENCOUNTER — Ambulatory Visit: Payer: BC Managed Care – PPO | Admitting: Adult Health

## 2018-03-13 VITALS — BP 126/89 | HR 73 | Temp 98.0°F | Resp 16 | Ht 68.0 in | Wt 157.6 lb

## 2018-03-13 DIAGNOSIS — S90861A Insect bite (nonvenomous), right foot, initial encounter: Secondary | ICD-10-CM | POA: Diagnosis not present

## 2018-03-13 DIAGNOSIS — W57XXXA Bitten or stung by nonvenomous insect and other nonvenomous arthropods, initial encounter: Principal | ICD-10-CM

## 2018-03-13 NOTE — Patient Instructions (Signed)
Insect Bite, Adult An insect bite can make your skin red, itchy, and swollen. Some insects can spread disease to people with a bite. However, most insect bites do not lead to disease, and most are not serious. Follow these instructions at home: Bite area care  Do not scratch the bite area.  Keep the bite area clean and dry.  Wash the bite area every day with soap and water as told by your doctor.  Check the bite area every day for signs of infection. Check for: ? More redness, swelling, or pain. ? Fluid or blood. ? Warmth. ? Pus. Managing pain, itching, and swelling  You may put any of these on the bite area as told by your doctor: ? A baking soda paste. ? Cortisone cream. ? Calamine lotion.  If directed, put ice on the bite area. ? Put ice in a plastic bag. ? Place a towel between your skin and the bag. ? Leave the ice on for 20 minutes, 2-3 times a day. Medicines  Take medicines or put medicines on your skin only as told by your doctor.  If you were prescribed an antibiotic medicine, use it as told by your doctor. Do not stop using the antibiotic even if your condition improves. General instructions  Keep all follow-up visits as told by your doctor. This is important. How is this prevented? To help you have a lower risk of insect bites:  When you are outside, wear clothing that covers your arms and legs.  Use insect repellent. The best insect repellents have: ? An active ingredient of DEET, picaridin, oil of lemon eucalyptus (OLE), or IR3535. ? Higher amounts of DEET or another active ingredient than other repellents have.  If your home windows do not have screens, think about putting some in.  Contact a doctor if:  You have more redness, swelling, or pain in the bite area.  You have fluid, blood, or pus coming from the bite area.  The bite area feels warm.  You have a fever. Get help right away if:  You have joint pain.  You have a rash.  You have  shortness of breath.  You feel more tired or sleepy than you normally do.  You have neck pain.  You have a headache.  You feel weaker than you normally do.  You have chest pain.  You have pain in your belly.  You feel sick to your stomach (nauseous) or you throw up (vomit). Summary  An insect bite can make your skin red, itchy, and swollen.  Do not scratch the bite area, and keep it clean and dry.  Ice can help with pain and itching from the bite. This information is not intended to replace advice given to you by your health care provider. Make sure you discuss any questions you have with your health care provider. Document Released: 05/27/2000 Document Revised: 12/31/2015 Document Reviewed: 10/15/2014 Elsevier Interactive Patient Education  2018 [employer]  

## 2018-03-13 NOTE — Progress Notes (Signed)
[facility] [place], [place] [address]  Internal MEDICINE  Office Visit Note  Patient Name: Judith Gomez  [id number]  [id number]  Date of Service: 03/13/2018  Chief Complaint  Patient presents with  . Insect Bite    happened on sunday didnt see what bite her. aching in the area located on the right side of the big toe. no symptoms of fever,chills after the bite     HPI Pt is here for a sick visit.  [place].  When she got in her car, she felt "liquid fire" on the the medial side of her right great toe. She reports it was very painful, and she could not find a bug or insect.  Within an hour, she noticed the site was bruising. She denies fever, redness.     Current Medication:  Outpatient Encounter Medications as of 03/13/2018  Medication Sig  . busPIRone (BUSPAR) 10 MG tablet Take 1/2 to 1 tablet po BID prn anxiety  . cholecalciferol (VITAMIN D) 1000 UNITS tablet Take 1,000 Units by mouth daily.  . Ciprofloxacin HCl 0.2 % otic solution Insert 4 drops in both ears twice daily for 7 days  . Fluocinolone Acetonide 0.01 % OIL Place 5 drops in ear(s) 2 (two) times daily.  Marland Kitchen gabapentin (NEURONTIN) 300 MG capsule Take 300 mg by mouth 2 (two) times daily.  Marland Kitchen HYDROcodone-acetaminophen (NORCO) 5-325 MG per tablet Take 1 tablet by mouth every 6 (six) hours as needed for moderate pain.  . magnesium 30 MG tablet Take 30 mg by mouth 2 (two) times daily.  . Multiple Vitamin (MULTIVITAMIN) capsule Take 1 capsule by mouth daily.  Marland Kitchen amoxicillin-clavulanate (AUGMENTIN) 875-125 MG tablet Take 1 tablet by mouth 2 (two) times daily. (Patient not taking: Reported on 03/13/2018)  . carisoprodol (SOMA) 250 MG tablet Take 1 tablet (250 mg total) by mouth at bedtime. (Patient not taking: Reported on 12/22/2017)   No facility-administered encounter medications on file as of 03/13/2018.       Medical History: Past Medical  History:  Diagnosis Date  . Anemia   . Arthritis   . Chronic kidney disease   . Heart murmur      Vital Signs: BP 126/89 (BP Location: Right Arm, Patient Position: Sitting, Cuff Size: Normal)   Pulse 73   Temp 98 F (36.7 C)   Resp 16   Ht 5\' 8"  (1.727 m)   Wt 157 lb 9.6 oz (71.5 kg)   SpO2 99%   BMI 23.96 kg/m    Review of Systems  Constitutional: Negative for chills, fatigue and unexpected weight change.  HENT: Negative for congestion, rhinorrhea, sneezing and sore throat.   Eyes: Negative for photophobia, pain and redness.  Respiratory: Negative for cough, chest tightness and shortness of breath.   Cardiovascular: Negative for chest pain and palpitations.  Gastrointestinal: Negative for abdominal pain, constipation, diarrhea, nausea and vomiting.  Endocrine: Negative.   Genitourinary: Negative for dysuria and frequency.  Musculoskeletal: Negative for arthralgias, back pain, joint swelling and neck pain.  Skin: Negative for rash.  Allergic/Immunologic: Negative.   Neurological: Negative for tremors and numbness.  Hematological: Negative for adenopathy. Does not bruise/bleed easily.  Psychiatric/Behavioral: Negative for behavioral problems and sleep disturbance. The patient is not nervous/anxious.     Physical Exam  Constitutional: She is oriented to person, place, and time. She appears well-developed and well-nourished. No distress.  HENT:  Head: Normocephalic and  atraumatic.  Mouth/Throat: Oropharynx is clear and moist. No oropharyngeal exudate.  Eyes: Pupils are equal, round, and reactive to light. EOM are normal.  Neck: Normal range of motion. Neck supple. No JVD present. No tracheal deviation present. No thyromegaly present.  Cardiovascular: Normal rate, regular rhythm and normal heart sounds. Exam reveals no gallop and no friction rub.  No murmur heard. Pulmonary/Chest: Effort normal and breath sounds normal. No respiratory distress. She has no wheezes. She has  no rales. She exhibits no tenderness.  Abdominal: Soft. There is no tenderness. There is no guarding.  Musculoskeletal: Normal range of motion.  Lymphadenopathy:    She has no cervical adenopathy.  Neurological: She is alert and oriented to person, place, and time. No cranial nerve deficit.  Skin: Skin is warm and dry. She is not diaphoretic.  2x3 cm bruising and mile erythema noted to left foot, at base of great toe. Medial side.  Psychiatric: She has a normal mood and affect. Her behavior is normal. Judgment and thought content normal.  Nursing note and vitals reviewed.   Assessment/Plan: 1. Insect bite of right foot, initial encounter Encouraged patient to keep an eye on site.  Return to clinic immediately if fever, redness or worsening symptoms develop.    General Counseling: Judith Gomez understanding of the findings of todays visit and agrees with plan of treatment. I have discussed any further diagnostic evaluation that may be needed or ordered today. We also reviewed her medications today. she has been encouraged to call the office with any questions or concerns that should arise related to todays visit.   No orders of the defined types were placed in this encounter.   No orders of the defined types were placed in this encounter.   Time spent: 25 Minutes  This patient was seen by Orson Gear AGNP-C in Collaboration with Dr Lavera Guise as a part of collaborative care agreement

## 2018-03-21 ENCOUNTER — Ambulatory Visit: Payer: PRIVATE HEALTH INSURANCE

## 2018-03-21 ENCOUNTER — Other Ambulatory Visit: Payer: Self-pay | Admitting: Adult Health

## 2018-03-21 DIAGNOSIS — F411 Generalized anxiety disorder: Secondary | ICD-10-CM

## 2018-03-21 MED ORDER — BUSPIRONE HCL 10 MG PO TABS: ORAL_TABLET | ORAL | 2 refills | Status: DC

## 2018-04-05 ENCOUNTER — Ambulatory Visit: Payer: BC Managed Care – PPO | Admitting: Nurse Practitioner

## 2018-04-05 ENCOUNTER — Encounter: Payer: Self-pay | Admitting: Nurse Practitioner

## 2018-04-05 VITALS — BP 126/85 | HR 77 | Temp 98.0°F | Resp 16 | Ht 68.0 in | Wt 159.8 lb

## 2018-04-05 DIAGNOSIS — J019 Acute sinusitis, unspecified: Secondary | ICD-10-CM | POA: Insufficient documentation

## 2018-04-05 DIAGNOSIS — M545 Low back pain, unspecified: Secondary | ICD-10-CM | POA: Insufficient documentation

## 2018-04-05 DIAGNOSIS — N390 Urinary tract infection, site not specified: Secondary | ICD-10-CM | POA: Diagnosis not present

## 2018-04-05 DIAGNOSIS — R319 Hematuria, unspecified: Secondary | ICD-10-CM

## 2018-04-05 LAB — POCT URINALYSIS DIPSTICK
BILIRUBIN UA: NEGATIVE
GLUCOSE UA: NEGATIVE
Ketones, UA: NEGATIVE
Nitrite, UA: NEGATIVE
Protein, UA: NEGATIVE
SPEC GRAV UA: 1.01 (ref 1.010–1.025)
Urobilinogen, UA: 0.2 E.U./dL
pH, UA: 6 (ref 5.0–8.0)

## 2018-04-05 MED ORDER — SULFAMETHOXAZOLE-TRIMETHOPRIM 800-160 MG PO TABS: 1.0000 | ORAL_TABLET | Freq: Two times a day (BID) | ORAL | 0 refills | Status: DC

## 2018-04-05 NOTE — Progress Notes (Signed)
[facility] [place],  [address]  Internal MEDICINE  Office Visit Note  Patient Name: Judith Gomez  [id number]  [id number]  Date of Service: 04/05/2018   Pt is here for a sick visit.  Chief Complaint  Patient presents with  . Urinary Tract Infection  . Sinusitis    pt has nasal congestion has been going on since last friday, when she coughs she taste the blood, coughing up white mucous, no fever or chills,     The patient is c/o of moderate back pain. This was so severe last night, was similar to labor pain. Took 800mg  ibuprpfen which helped very little. Has some dysuria as well.  She has nasal congestion and sinus pressure. Blowing out and coughing up white colored mucus. She has scratchy throat and headache. Denies fever, nausea, or vomiting. Does have decreased appetite.        Current Medication:  Outpatient Encounter Medications as of 04/05/2018  Medication Sig  . cholecalciferol (VITAMIN D) 1000 UNITS tablet Take 1,000 Units by mouth daily.  . Fluocinolone Acetonide 0.01 % OIL Place 5 drops in ear(s) 2 (two) times daily.  Marland Kitchen gabapentin (NEURONTIN) 300 MG capsule Take 300 mg by mouth 2 (two) times daily.  Marland Kitchen HYDROcodone-acetaminophen (NORCO) 5-325 MG per tablet Take 1 tablet by mouth every 6 (six) hours as needed for moderate pain.  . magnesium 30 MG tablet Take 30 mg by mouth 2 (two) times daily.  . Multiple Vitamin (MULTIVITAMIN) capsule Take 1 capsule by mouth daily.  Marland Kitchen sulfamethoxazole-trimethoprim (BACTRIM DS,SEPTRA DS) 800-160 MG tablet Take 1 tablet by mouth 2 (two) times daily.  . [DISCONTINUED] amoxicillin-clavulanate (AUGMENTIN) 875-125 MG tablet Take 1 tablet by mouth 2 (two) times daily. (Patient not taking: Reported on 03/13/2018)  . [DISCONTINUED] busPIRone (BUSPAR) 10 MG tablet Take 1/2 to 1 tablet po BID prn anxiety  . [DISCONTINUED] carisoprodol (SOMA) 250 MG tablet Take 1 tablet (250 mg total) by mouth at bedtime.  (Patient not taking: Reported on 12/22/2017)  . [DISCONTINUED] Ciprofloxacin HCl 0.2 % otic solution Insert 4 drops in both ears twice daily for 7 days   No facility-administered encounter medications on file as of 04/05/2018.       Medical History: Past Medical History:  Diagnosis Date  . Anemia   . Arthritis   . Chronic kidney disease   . Heart murmur      Today's Vitals   04/05/18 1451  BP: 126/85  Pulse: 77  Resp: 16  Temp: 98 F (36.7 C)  SpO2: 98%  Weight: 159 lb 12.8 oz (72.5 kg)  Height: 5\' 8"  (1.727 m)   Review of Systems  Constitutional: Positive for activity change and fatigue. Negative for chills and fever.  HENT: Positive for congestion, ear pain, postnasal drip, rhinorrhea, sinus pain and sore throat. Negative for voice change.   Respiratory: Negative for cough and wheezing.   Cardiovascular: Negative for chest pain and palpitations.  Gastrointestinal: Negative.   Endocrine: Negative.   Genitourinary: Positive for dysuria and flank pain.  Musculoskeletal: Positive for back pain.  Skin: Negative.   Allergic/Immunologic: Negative.   Neurological: Positive for headaches.  Psychiatric/Behavioral: Negative.     Physical Exam  Constitutional: She is oriented to person, place, and time. She appears well-developed and well-nourished. No distress.  HENT:  Head: Normocephalic and atraumatic.  Nose: Rhinorrhea present. Right sinus exhibits frontal sinus tenderness. Left sinus exhibits frontal sinus tenderness.  Mouth/Throat: Oropharynx is clear and moist. No  oropharyngeal exudate.  Eyes: Pupils are equal, round, and reactive to light. EOM are normal.  Neck: Normal range of motion. Neck supple. No JVD present. No tracheal deviation present. No thyromegaly present.  Cardiovascular: Normal rate, regular rhythm and normal heart sounds. Exam reveals no gallop and no friction rub.  No murmur heard. Pulmonary/Chest: Effort normal and breath sounds normal. No  respiratory distress. She has no wheezes. She has no rales. She exhibits no tenderness.  Abdominal: Soft. Bowel sounds are normal.  Genitourinary:  Genitourinary Comments: Urine sample positive for small WBC and small blood.   Musculoskeletal: Normal range of motion.  Lymphadenopathy:    She has cervical adenopathy.  Neurological: She is alert and oriented to person, place, and time. No cranial nerve deficit.  Skin: Skin is warm and dry. She is not diaphoretic.  Psychiatric: She has a normal mood and affect. Her behavior is normal. Judgment and thought content normal.  Nursing note and vitals reviewed.  Assessment/Plan:  1. Urinary tract infection with hematuria, site unspecified Start bactrim DS bid for 10 days. Send urine for culture and sensitivity and adjust meds as indicated.  - sulfamethoxazole-trimethoprim (BACTRIM DS,SEPTRA DS) 800-160 MG tablet; Take 1 tablet by mouth 2 (two) times daily.  Dispense: 20 tablet; Refill: 0  2. Acute non-recurrent sinusitis, unspecified location Bactrim DS to help treat sinusitis. Continue to use OTC medication as needed and as indicated to alleviate symptoms.   3. Acute low back pain, unspecified back pain laterality, unspecified whether sciatica present Likely due to UTI. Treat with bactrim DS bid for 10 days. Adjust antibiotics as indicated. Recommended continued use of ibuprofen 800mg  three times daily as needed for pain.  - POCT Urinalysis Dipstick - CULTURE, URINE COMPREHENSIVE  General Counseling: gladyes kudo understanding of the findings of todays visit and agrees with plan of treatment. I have discussed any further diagnostic evaluation that may be needed or ordered today. We also reviewed her medications today. she has been encouraged to call the office with any questions or concerns that should arise related to todays visit.    Counseling:  Rest and increase fluids. Continue using OTC medication to control symptoms.   This  patient was seen by Leretha Pol FNP Collaboration with Dr Lavera Guise as a part of collaborative care agreement  Orders Placed This Encounter  Procedures  . CULTURE, URINE COMPREHENSIVE  . POCT Urinalysis Dipstick    Meds ordered this encounter  Medications  . sulfamethoxazole-trimethoprim (BACTRIM DS,SEPTRA DS) 800-160 MG tablet    Sig: Take 1 tablet by mouth 2 (two) times daily.    Dispense:  20 tablet    Refill:  0    Order Specific Question:   Supervising Provider    Answer:   Lavera Guise [[id number]]    Time spent: 25 Minutes

## 2018-04-08 LAB — CULTURE, URINE COMPREHENSIVE

## 2018-04-23 ENCOUNTER — Ambulatory Visit: Payer: PRIVATE HEALTH INSURANCE

## 2018-04-30 ENCOUNTER — Ambulatory Visit: Admit: 2018-04-30 | Discharge: 2018-04-30 | Payer: PRIVATE HEALTH INSURANCE

## 2018-04-30 DIAGNOSIS — Z01419 Encounter for gynecological examination (general) (routine) without abnormal findings: Secondary | ICD-10-CM

## 2018-04-30 LAB — HPV HIGH RISK WITH GENOTYPING
HPV DNA High Risk Oth: NEGATIVE
HPV Genotype 16: NEGATIVE
HPV Genotype 18: NEGATIVE

## 2018-04-30 NOTE — Unmapped (Signed)
Subjective:       Julie Molina is a 47 y.o. female here for a routine exam.  Current complaints: needs pap.  Personal health questionnaire reviewed: yes.  Menses regular, q24mo, moderate flow overall, No cramps.  No dyspareunia.  No bladder issues.  Gets calcium and vitamin D.     Gynecologic History  Patient's last menstrual period was 04/16/2018.  Contraception: vasectomy  Last Pap: 2014. Results were: normal  Last mammogram: 2019. Results were: normal    Obstetric History  OB History   Gravida Para Term Preterm AB Living   6 3 3   3 3    SAB TAB Ectopic Multiple Live Births   3       3      # Outcome Date GA Lbr Len/2nd Weight Sex Delivery Anes PTL Lv   6 SAB            5 SAB            4 SAB            3 Term         LIV   2 Term         LIV   1 Term         LIV     The following portions of the patient's history were reviewed and updated as appropriate: allergies, current medications, past family history, past medical history, past social history, past surgical history and problem list.    Review of Systems  Review of systems not obtained due to patient factors.      Objective:      General appearance: alert, appears stated age and cooperative  Head: Normocephalic, without obvious abnormality, atraumatic  Eyes: negative findings: lids and lashes normal, conjunctivae and sclerae normal and corneas clear  Nose: Nares normal. Septum midline. Mucosa normal. No drainage or sinus tenderness.  Throat: lips, mucosa, and tongue normal; teeth and gums normal  Neck: no adenopathy, no JVD, supple, symmetrical, trachea midline and thyroid not enlarged, symmetric, no tenderness/mass/nodules  Back: symmetric, no curvature. ROM normal. No CVA tenderness.  Lungs: clear to auscultation bilaterally and no respiratory distress  Breasts: normal appearance, no masses or tenderness, both rather fibrous  Heart: regular rate and rhythm and S1, S2 normal  Abdomen: soft, non-tender; bowel sounds normal; no masses,  no  organomegaly  Pelvic: cervix normal in appearance, external genitalia normal, no adnexal masses or tenderness, no bladder tenderness, no cervical motion tenderness, rectovaginal septum normal, urethra without abnormality or discharge, uterus normal size, shape, and consistency, vagina normal without discharge and uterus anteveted and with mild descensus.  Mild cystocele noted.  Extremities: extremities normal, atraumatic, no cyanosis or edema  Pulses: 2+ and symmetric  Skin: Skin color, texture, turgor normal. No rashes or lesions or suspicious freckles or moles.  Lymph nodes: Cervical, supraclavicular, and axillary nodes normal.  Neurologic: Grossly normal      Assessment:      Healthy female exam. Mild pelvic relaxation     Plan:      Education reviewed: calcium supplements, self breast exams, skin cancer screening and weight bearing exercise.  Contraception: vasectomy.  Follow up in: 1 year.  pap done.  Colonoscopy next year.     Mammogram q16yr.  Recommended Kegel's once a day.

## 2018-05-08 NOTE — Telephone Encounter (Signed)
Called and informed patient of results.

## 2018-05-08 NOTE — Telephone Encounter (Signed)
 Pap negative.  Will repeat as per guidelines

## 2018-06-27 ENCOUNTER — Telehealth: Payer: Self-pay | Admitting: Nurse Practitioner

## 2018-06-27 NOTE — Telephone Encounter (Signed)
Called patient and left message and asked patient to call and schedule appointment prior to any referrals, per provider patient needs to be seen first to discuss. Judith Gomez

## 2018-07-03 ENCOUNTER — Ambulatory Visit: Payer: BC Managed Care – PPO | Admitting: Nurse Practitioner

## 2018-07-03 ENCOUNTER — Encounter: Payer: Self-pay | Admitting: Nurse Practitioner

## 2018-07-03 VITALS — BP 127/79 | HR 82 | Resp 16 | Ht 68.0 in | Wt 162.4 lb

## 2018-07-03 DIAGNOSIS — N924 Excessive bleeding in the premenopausal period: Secondary | ICD-10-CM | POA: Diagnosis not present

## 2018-07-03 DIAGNOSIS — D259 Leiomyoma of uterus, unspecified: Secondary | ICD-10-CM

## 2018-07-03 NOTE — Progress Notes (Signed)
[facility] [place], [place] [address]  Internal MEDICINE  Office Visit Note  Patient Name: Judith Gomez  [id number]  [id number]  Date of Service: 07/04/2018  Chief Complaint  Patient presents with  . Referral    discuss referral    The patient is c/o very heavy menstrual bleeding with cramping. Is saturating two heavy flow pads in less than one hour. Bleeding contains large blood clots. At night, she has to put a towel down in addition to sanitary pads because bleeding is so heavy. This has been going on for a few years and gradually getting worse. An ultrasound was done two years back showing fibroids in the uterus. She would like to have referral to GYN for further evaluation and treatment.       Current Medication: Outpatient Encounter Medications as of 07/03/2018  Medication Sig  . acetaminophen (TYLENOL) 325 MG tablet Take 1 tablet by mouth.  . cholecalciferol (VITAMIN D) 1000 UNITS tablet Take 1,000 Units by mouth daily.  . Multiple Vitamin (MULTIVITAMIN) capsule Take 1 capsule by mouth daily.  . Fluocinolone Acetonide 0.01 % OIL Place 5 drops in ear(s) 2 (two) times daily.  . [DISCONTINUED] gabapentin (NEURONTIN) 300 MG capsule Take 300 mg by mouth 2 (two) times daily.  . [DISCONTINUED] HYDROcodone-acetaminophen (NORCO) 5-325 MG per tablet Take 1 tablet by mouth every 6 (six) hours as needed for moderate pain. (Patient not taking: Reported on 07/03/2018)  . [DISCONTINUED] magnesium 30 MG tablet Take 30 mg by mouth 2 (two) times daily.  . [DISCONTINUED] sulfamethoxazole-trimethoprim (BACTRIM DS,SEPTRA DS) 800-160 MG tablet Take 1 tablet by mouth 2 (two) times daily. (Patient not taking: Reported on 07/03/2018)   No facility-administered encounter medications on file as of 07/03/2018.     Surgical History: Past Surgical History:  Procedure Laterality Date  . CESAREAN SECTION    . COLONOSCOPY    . KNEE ARTHROSCOPY Left 11/18/2014   Procedure:  ARTHROSCOPY KNEE;  Surgeon: Hessie Knows, MD;  Location: [facility] ORS;  Service: Orthopedics;  Laterality: Left;   partial medial menisectomy  . THUMB FUSION Left 2014  . TUBAL LIGATION    . WISDOM TOOTH EXTRACTION      Medical History: Past Medical History:  Diagnosis Date  . Anemia   . Arthritis   . Chronic kidney disease   . Heart murmur     Family History: Family History  Problem Relation Age of Onset  . Breast cancer Neg Hx     Social History   Socioeconomic History  . Marital status: Married    Spouse name: Not on file  . Number of children: Not on file  . Years of education: Not on file  . Highest education level: Not on file  Occupational History  . Not on file  Social Needs  . Financial resource strain: Not on file  . Food insecurity:    Worry: Not on file    Inability: Not on file  . Transportation needs:    Medical: Not on file    Non-medical: Not on file  Tobacco Use  . Smoking status: Never Smoker  . Smokeless tobacco: Never Used  Substance and Sexual Activity  . Alcohol use: No  . Drug use: No  . Sexual activity: Not on file  Lifestyle  . Physical activity:    Days per week: Not on file    Minutes per session: Not on file  . Stress: Not on file  Relationships  . Social  connections:    Talks on phone: Not on file    Gets together: Not on file    Attends religious service: Not on file    Active member of club or organization: Not on file    Attends meetings of clubs or organizations: Not on file    Relationship status: Not on file  . Intimate partner violence:    Fear of current or ex partner: Not on file    Emotionally abused: Not on file    Physically abused: Not on file    Forced sexual activity: Not on file  Other Topics Concern  . Not on file  Social History Narrative  . Not on file      Review of Systems  Constitutional: Positive for fatigue. Negative for chills and unexpected weight change.  HENT: Negative for congestion,  postnasal drip, rhinorrhea, sneezing and sore throat.   Respiratory: Negative for cough, chest tightness, shortness of breath and wheezing.   Cardiovascular: Negative for chest pain and palpitations.  Gastrointestinal: Negative for abdominal pain, constipation, diarrhea, nausea and vomiting.  Endocrine: Positive for cold intolerance. Negative for heat intolerance, polydipsia and polyuria.  Genitourinary: Positive for menstrual problem and vaginal bleeding. Negative for dysuria and frequency.  Musculoskeletal: Positive for back pain. Negative for arthralgias and joint swelling.  Skin: Negative for rash.  Neurological: Negative for dizziness, tremors, numbness and headaches.  Hematological: Negative for adenopathy. Does not bruise/bleed easily.  Psychiatric/Behavioral: Negative for behavioral problems (Depression), sleep disturbance and suicidal ideas. The patient is not nervous/anxious.     Today's Vitals   07/03/18 1547  BP: 127/79  Pulse: 82  Resp: 16  SpO2: 98%  Weight: 162 lb 6.4 oz (73.7 kg)  Height: 5\' 8"  (1.727 m)    Physical Exam Vitals signs and nursing note reviewed.  Constitutional:      General: She is not in acute distress.    Appearance: Normal appearance. She is well-developed. She is not diaphoretic.  HENT:     Head: Normocephalic and atraumatic.     Mouth/Throat:     Pharynx: No oropharyngeal exudate.  Eyes:     Pupils: Pupils are equal, round, and reactive to light.  Neck:     Musculoskeletal: Normal range of motion and neck supple.     Thyroid: No thyromegaly.     Vascular: No JVD.     Trachea: No tracheal deviation.  Cardiovascular:     Rate and Rhythm: Normal rate and regular rhythm.     Heart sounds: Normal heart sounds. No murmur. No friction rub. No gallop.   Pulmonary:     Effort: Pulmonary effort is normal. No respiratory distress.     Breath sounds: Normal breath sounds. No wheezing or rales.  Chest:     Chest wall: No tenderness.  Abdominal:      General: Bowel sounds are normal.     Palpations: Abdomen is soft.  Musculoskeletal: Normal range of motion.  Lymphadenopathy:     Cervical: No cervical adenopathy.  Skin:    General: Skin is warm and dry.  Neurological:     Mental Status: She is alert and oriented to person, place, and time.     Cranial Nerves: No cranial nerve deficit.  Psychiatric:        Behavior: Behavior normal.        Thought Content: Thought content normal.        Judgment: Judgment normal.   Assessment/Plan: 1. Excessive bleeding in premenopausal period - Ambulatory  referral to Gynecology for further evaluation and treatment.   2. Uterine leiomyoma, unspecified location - Ambulatory referral to Gynecology for further evaluation and treatment.   General Counseling: trace cederberg understanding of the findings of todays visit and agrees with plan of treatment. I have discussed any further diagnostic evaluation that may be needed or ordered today. We also reviewed her medications today. she has been encouraged to call the office with any questions or concerns that should arise related to todays visit.  This patient was seen by Leretha Pol FNP Collaboration with Dr Lavera Guise as a part of collaborative care agreement  Orders Placed This Encounter  Procedures  . Ambulatory referral to Gynecology      Time spent: [place] [facility]

## 2018-07-04 DIAGNOSIS — D259 Leiomyoma of uterus, unspecified: Secondary | ICD-10-CM | POA: Insufficient documentation

## 2018-07-04 DIAGNOSIS — N924 Excessive bleeding in the premenopausal period: Secondary | ICD-10-CM | POA: Insufficient documentation

## 2018-07-11 ENCOUNTER — Ambulatory Visit: Payer: BC Managed Care – PPO | Admitting: Obstetrics and Gynecology

## 2018-07-11 ENCOUNTER — Other Ambulatory Visit (HOSPITAL_COMMUNITY)
Admission: RE | Admit: 2018-07-11 | Discharge: 2018-07-11 | Disposition: A | Discharge status: Home / Self Care | Payer: BC Managed Care – PPO | Source: Ambulatory Visit | Attending: Obstetrics and Gynecology | Admitting: Obstetrics and Gynecology

## 2018-07-11 ENCOUNTER — Encounter: Payer: Self-pay | Admitting: Obstetrics and Gynecology

## 2018-07-11 VITALS — BP 128/74 | HR 73 | Ht 68.0 in | Wt 160.3 lb

## 2018-07-11 DIAGNOSIS — N920 Excessive and frequent menstruation with regular cycle: Secondary | ICD-10-CM | POA: Diagnosis not present

## 2018-07-11 DIAGNOSIS — N841 Polyp of cervix uteri: Secondary | ICD-10-CM | POA: Insufficient documentation

## 2018-07-11 NOTE — Progress Notes (Signed)
HPI:      [name] is a 48 y.o. No obstetric history on file. who LMP was Patient's last menstrual period was 06/19/2018 (approximate).  Subjective:   She presents today with complaint of heavy menstrual bleeding each cycle.  She states that her cycles are regular but the first 3 days are very heavy with significant bleeding and cramping.  She describes a remote history of uterine fibroids and "polyps".  She does not know how big the fibroids are or where the polyps are located.  She is interested in hearing about options other than surgical. She continues to have regular menses, denies hot flashes, denies bleeding after intercourse or bleeding between menses.    Hx: The following portions of the patient's history were reviewed and updated as appropriate:             She  has a past medical history of Anemia, Arthritis, Chronic kidney disease, and Heart murmur. She does not have any pertinent problems on file. She  has a past surgical history that includes Cesarean section; Tubal ligation; Colonoscopy; Wisdom tooth extraction; Thumb fusion (Left, 2014); and Knee arthroscopy (Left, 11/18/2014). Her family history is not on file. She  reports that she has never smoked. She has never used smokeless tobacco. She reports that she does not drink alcohol or use drugs. She has a current medication list which includes the following prescription(s): acetaminophen, cholecalciferol, fluocinolone acetonide, and multivitamin. She is allergic to tape.       Review of Systems:  Review of Systems  Constitutional: Denied constitutional symptoms, night sweats, recent illness, fatigue, fever, insomnia and weight loss.  Eyes: Denied eye symptoms, eye pain, photophobia, vision change and visual disturbance.  Ears/Nose/Throat/Neck: Denied ear, nose, throat or neck symptoms, hearing loss, nasal discharge, sinus congestion and sore throat.  Cardiovascular: Denied cardiovascular symptoms, arrhythmia, chest  pain/pressure, edema, exercise intolerance, orthopnea and palpitations.  Respiratory: Denied pulmonary symptoms, asthma, pleuritic pain, productive sputum, cough, dyspnea and wheezing.  Gastrointestinal: Denied, gastro-esophageal reflux, melena, nausea and vomiting.  Genitourinary: See HPI for additional information.  Musculoskeletal: Denied musculoskeletal symptoms, stiffness, swelling, muscle weakness and myalgia.  Dermatologic: Denied dermatology symptoms, rash and scar.  Neurologic: Denied neurology symptoms, dizziness, headache, neck pain and syncope.  Psychiatric: Denied psychiatric symptoms, anxiety and depression.  Endocrine: Denied endocrine symptoms including hot flashes and night sweats.   Meds:   Current Outpatient Medications on File Prior to Visit  Medication Sig Dispense Refill  . acetaminophen (TYLENOL) 325 MG tablet Take 1 tablet by mouth.    . cholecalciferol (VITAMIN D) 1000 UNITS tablet Take 1,000 Units by mouth daily.    . Fluocinolone Acetonide 0.01 % OIL Place 5 drops in ear(s) 2 (two) times daily. 20 mL 0  . Multiple Vitamin (MULTIVITAMIN) capsule Take 1 capsule by mouth daily.     No current facility-administered medications on file prior to visit.     Objective:     Vitals:   07/11/18 0826  BP: 128/74  Pulse: 73              Physical examination   Pelvic:   Vulva: Normal appearance.  No lesions.  Vagina: No lesions or abnormalities noted.  Support: Normal pelvic support.  Urethra No masses tenderness or scarring.  Meatus Normal size without lesions or prolapse.  Cervix: Normal appearance.  No lesions.  Very large endocervical polyp noted.  Small stalk present.  Anus: Normal exam.  No lesions.  Perineum: Normal exam.  No lesions.        Bimanual   Uterus:  Top normal size by palpation non-tender.  Mobile.  AV.  Adnexae: No masses.  Non-tender to palpation.  Cul-de-sac: Negative for abnormality.   Procedure: Using 3-0 Vicryl the stalk of the  polyp was tied.  The stalk was then crossclamped and the polyp was cut free.  The base of the polyp was cauterized using silver nitrate followed by Monsel solution.  Hemostasis was noted. Polyp sent to pathology.  Assessment:    No obstetric history on file. Patient Active Problem List   Diagnosis Date Noted  . Excessive bleeding in premenopausal period 07/04/2018  . Uterine leiomyoma 07/04/2018  . Urinary tract infection with hematuria 04/05/2018  . Acute non-recurrent sinusitis 04/05/2018  . Acute low back pain 04/05/2018  . Chest pain 01/17/2018  . Generalized anxiety disorder 01/17/2018  . Acute otitis externa of both ears 01/17/2018  . Subacromial bursitis of right shoulder joint 12/15/2017  . Pain in right hand 09/18/2017  . 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 Melanoma ([place]) 01/10/2017  . History of anemia 01/17/2014  . Morton's neuroma 01/17/2014  . Multiple renal cysts 01/17/2014  . Psoriatic arthritis ([place]) 01/17/2014     1. Menorrhagia with regular cycle     It is possible that her large endocervical polyp was causing the excessive bleeding although fibroids still remain a very real possibility as part of her cramping and heavy bleeding.   Plan:            1.  Ultrasound to delineate uterine fibroids  2.  We have discussed in some detail endometrial ablation, hormonal control of bleeding, effect that menopause has on uterine fibroids, use of IUD for control of menorrhagia.  When the ultrasound returns we will consider some of these options if necessary.  3.  Patient to have 2 menstrual cycles and see if the bleeding has improved with polyp removal. Orders No orders of the defined types were placed in this encounter.   No orders of the defined types were placed in this encounter.     F/U  Return for Pt to contact us if symptoms worsen, We will contact her with any abnormal test results.  I spent 35 minutes involved in the care of this patient  of which greater than 50% was spent discussing uterine fibroids, polyps, management of menorrhagia, diagnostic methods including ultrasound, endocervical polyps, effect of menopause on uterine fibroids and bleeding.  All questions answered  Finis Bud, M.D. 07/11/2018 9:29 AM

## 2018-07-11 NOTE — Progress Notes (Signed)
Patient comes in today for a new gyn appointment. She is complaining of heavy bleeding the first three days of her cycle. She said that US showed fibroids and polyps. We do not have a copy of [place].

## 2018-07-11 NOTE — Addendum Note (Signed)
Addended by: Durwin Glaze on: 07/11/2018 10:05 AM   Modules accepted: Orders

## 2018-07-17 ENCOUNTER — Ambulatory Visit (INDEPENDENT_AMBULATORY_CARE_PROVIDER_SITE_OTHER): Payer: BC Managed Care – PPO

## 2018-07-17 DIAGNOSIS — N920 Excessive and frequent menstruation with regular cycle: Secondary | ICD-10-CM

## 2018-07-24 ENCOUNTER — Telehealth: Payer: Self-pay | Admitting: Obstetrics and Gynecology

## 2018-07-24 NOTE — Telephone Encounter (Signed)
Can you please review [place] results for patient.

## 2018-07-24 NOTE — Telephone Encounter (Signed)
The patient is asking for a followup call from her [place] from 1 week ago, please advise, thanks.

## 2018-07-25 NOTE — Telephone Encounter (Signed)
LM for patient to return call.

## 2018-08-02 NOTE — Telephone Encounter (Signed)
LM for patient to return call.

## 2018-08-07 ENCOUNTER — Ambulatory Visit: Payer: BC Managed Care – PPO | Admitting: Obstetrics and Gynecology

## 2018-08-07 ENCOUNTER — Encounter: Payer: Self-pay | Admitting: Obstetrics and Gynecology

## 2018-08-07 VITALS — BP 118/69 | HR 77 | Ht 68.0 in | Wt 158.7 lb

## 2018-08-07 DIAGNOSIS — N920 Excessive and frequent menstruation with regular cycle: Secondary | ICD-10-CM

## 2018-08-07 DIAGNOSIS — D251 Intramural leiomyoma of uterus: Secondary | ICD-10-CM | POA: Diagnosis not present

## 2018-08-07 NOTE — Progress Notes (Signed)
Patient comes in today for [place] results.

## 2018-08-07 NOTE — Telephone Encounter (Signed)
Patient came in for appointment.  

## 2018-08-07 NOTE — Progress Notes (Signed)
HPI:      [name] is a 48 y.o. No obstetric history on file. who LMP was No LMP recorded.  Subjective:   She presents today with numerous questions and concerns regarding her ultrasound.  She would also like to talk about definitive management including possibility of hysterectomy.  She also had questions regarding bladder repair surgery.  (Her grandmother has had significant prolapse issues) She has had one mense since polyp removal and she states it has been just as heavy as before.    Hx: The following portions of the patient's history were reviewed and updated as appropriate:             She  has a past medical history of Anemia, Arthritis, Chronic kidney disease, and Heart murmur. She does not have any pertinent problems on file. She  has a past surgical history that includes Cesarean section; Tubal ligation; Colonoscopy; Wisdom tooth extraction; Thumb fusion (Left, 2014); and Knee arthroscopy (Left, 11/18/2014). Her family history is not on file. She  reports that she has never smoked. She has never used smokeless tobacco. She reports that she does not drink alcohol or use drugs. She has a current medication list which includes the following prescription(s): acetaminophen, cholecalciferol, fluocinolone acetonide, and multivitamin. She is allergic to tape.       Review of Systems:  Review of Systems  Constitutional: Denied constitutional symptoms, night sweats, recent illness, fatigue, fever, insomnia and weight loss.  Eyes: Denied eye symptoms, eye pain, photophobia, vision change and visual disturbance.  Ears/Nose/Throat/Neck: Denied ear, nose, throat or neck symptoms, hearing loss, nasal discharge, sinus congestion and sore throat.  Cardiovascular: Denied cardiovascular symptoms, arrhythmia, chest pain/pressure, edema, exercise intolerance, orthopnea and palpitations.  Respiratory: Denied pulmonary symptoms, asthma, pleuritic pain, productive sputum, cough, dyspnea and  wheezing.  Gastrointestinal: Denied, gastro-esophageal reflux, melena, nausea and vomiting.  Genitourinary: See HPI for additional information.  Musculoskeletal: Denied musculoskeletal symptoms, stiffness, swelling, muscle weakness and myalgia.  Dermatologic: Denied dermatology symptoms, rash and scar.  Neurologic: Denied neurology symptoms, dizziness, headache, neck pain and syncope.  Psychiatric: Denied psychiatric symptoms, anxiety and depression.  Endocrine: Denied endocrine symptoms including hot flashes and night sweats.   Meds:   Current Outpatient Medications on File Prior to Visit  Medication Sig Dispense Refill  . acetaminophen (TYLENOL) 325 MG tablet Take 1 tablet by mouth.    . cholecalciferol (VITAMIN D) 1000 UNITS tablet Take 1,000 Units by mouth daily.    . Fluocinolone Acetonide 0.01 % OIL Place 5 drops in ear(s) 2 (two) times daily. 20 mL 0  . Multiple Vitamin (MULTIVITAMIN) capsule Take 1 capsule by mouth daily.     No current facility-administered medications on file prior to visit.     Objective:     Vitals:   08/07/18 0827  BP: 118/69  Pulse: 77              Ultrasound results reviewed directly with the patient.  We walked through the findings point by point.  Assessment:    No obstetric history on file. Patient Active Problem List   Diagnosis Date Noted  . Excessive bleeding in premenopausal period 07/04/2018  . Uterine leiomyoma 07/04/2018  . Urinary tract infection with hematuria 04/05/2018  . Acute non-recurrent sinusitis 04/05/2018  . Acute low back pain 04/05/2018  . Chest pain 01/17/2018  . Generalized anxiety disorder 01/17/2018  . Acute otitis externa of both ears 01/17/2018  . Subacromial bursitis of right shoulder joint 12/15/2017  .  Pain in right hand 09/18/2017  . 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 Melanoma ([place]) 01/10/2017  . History of anemia 01/17/2014  . Morton's neuroma 01/17/2014  . Multiple renal cysts  01/17/2014  . Psoriatic arthritis ([place]) 01/17/2014     1. Menorrhagia with regular cycle   2. Fibroids, intramural     Patient likely having bleeding secondary to uterine fibroid.  She describes a regular menstrual cycle that is much heavier than she would like it to be associated with cramps.  She is likely a few years away from menopause based on age.  There is a question whether the fibroid is somewhat submucosal or strictly intramural-it is difficult to tell from the ultrasound.   Plan:            1.  Fibroids Uterine fibroids were discussed in detail.  The natural course and history of fibroids were reviewed.  Multiple treatment options were also discussed including NSAIDS, hormonal options and hysterectomy.  Menopause and its effect on fibroids was also reviewed. Management options regarding menorrhagia discussed in detail. 2.  We have discussed the possibility of hysterectomy in detail at length of stay, risk factors, type of hysterectomy, possible nephrectomy discussed. 3.  Possibility of bladder prolapse discussed in detail.  Effect of hysterectomy on bladder prolapse discussed.  Genetic factors discussed.  All questions answered 4.  Patient has decided upon IUD management of her menorrhagia.  She knows that the possible submucosal nature of the fibroid could be a factor but she would like to try this first.  Should this fail I believe she is leaning toward hysterectomy. Orders No orders of the defined types were placed in this encounter.   No orders of the defined types were placed in this encounter.     F/U  Return for She is to call at the start of next menses. I spent 28 minutes involved in the care of this patient of which greater than 50% was spent discussing please see above discussions in detail regarding uterine fibroids bladder prolapse management etc.  All questions answered.  Finis Bud, M.D. 08/07/2018 9:52 AM

## 2018-08-14 ENCOUNTER — Other Ambulatory Visit: Payer: Self-pay | Admitting: Nurse Practitioner

## 2018-08-14 ENCOUNTER — Telehealth: Payer: Self-pay | Admitting: Gastroenterology

## 2018-08-14 ENCOUNTER — Ambulatory Visit (INDEPENDENT_AMBULATORY_CARE_PROVIDER_SITE_OTHER): Payer: BC Managed Care – PPO | Admitting: Nurse Practitioner

## 2018-08-14 ENCOUNTER — Encounter: Payer: Self-pay | Admitting: Nurse Practitioner

## 2018-08-14 VITALS — BP 117/78 | HR 71 | Resp 16 | Ht 68.0 in | Wt 156.2 lb

## 2018-08-14 DIAGNOSIS — Z1231 Encounter for screening mammogram for malignant neoplasm of breast: Secondary | ICD-10-CM

## 2018-08-14 DIAGNOSIS — D259 Leiomyoma of uterus, unspecified: Secondary | ICD-10-CM

## 2018-08-14 DIAGNOSIS — Z0001 Encounter for general adult medical examination with abnormal findings: Secondary | ICD-10-CM | POA: Diagnosis not present

## 2018-08-14 DIAGNOSIS — Z1211 Encounter for screening for malignant neoplasm of colon: Secondary | ICD-10-CM

## 2018-08-14 DIAGNOSIS — Z1239 Encounter for other screening for malignant neoplasm of breast: Secondary | ICD-10-CM | POA: Diagnosis not present

## 2018-08-14 DIAGNOSIS — E559 Vitamin D deficiency, unspecified: Secondary | ICD-10-CM | POA: Insufficient documentation

## 2018-08-14 DIAGNOSIS — R300 Dysuria: Secondary | ICD-10-CM

## 2018-08-14 NOTE — Progress Notes (Signed)
[facility] [place], [place] [address]  Internal MEDICINE  Office Visit Note  Patient Name: Judith Gomez  [id number]  [id number]  Date of Service: 08/14/2018   Pt is here for routine health maintenance examination   Chief Complaint  Patient presents with  . Annual Exam  . Anemia  . Arthritis  . Chronic Kidney Disease     The patient was recently seen at GYN provider. She was found to have baseball sized fibroid tumor in her uterus and several cysts on left ovary. She has opted to have an IUD placed which will happen when she starts her next menstrual cycle. She has also seen podiatry for spur in her foot. Has had a few injections which have helped some .she will be do, soon, for routine fasting labs as well as mammogram. She is also due for screening colonoscopy. She has positive family history of colon cancer.    Current Medication: Outpatient Encounter Medications as of 08/14/2018  Medication Sig  . acetaminophen (TYLENOL) 325 MG tablet Take 1 tablet by mouth.  . cholecalciferol (VITAMIN D) 1000 UNITS tablet Take 1,000 Units by mouth daily.  . Fluocinolone Acetonide 0.01 % OIL Place 5 drops in ear(s) 2 (two) times daily.  . Multiple Vitamin (MULTIVITAMIN) capsule Take 1 capsule by mouth daily.   No facility-administered encounter medications on file as of 08/14/2018.     Surgical History: Past Surgical History:  Procedure Laterality Date  . CESAREAN SECTION    . COLONOSCOPY    . KNEE ARTHROSCOPY Left 11/18/2014   Procedure: ARTHROSCOPY KNEE;  Surgeon: Hessie Knows, MD;  Location: [facility] ORS;  Service: Orthopedics;  Laterality: Left;   partial medial menisectomy  . THUMB FUSION Left 2014  . TUBAL LIGATION    . WISDOM TOOTH EXTRACTION      Medical History: Past Medical History:  Diagnosis Date  . Anemia   . Arthritis   . Chronic kidney disease   . Heart murmur     Family History: Family History  Problem Relation Age of Onset  . Breast  cancer Neg Hx       Review of Systems  Constitutional: Negative for chills, fatigue and unexpected weight change.  HENT: Negative for congestion, postnasal drip, rhinorrhea, sneezing and sore throat.   Respiratory: Negative for cough, chest tightness, shortness of breath and wheezing.   Cardiovascular: Negative for chest pain and palpitations.  Gastrointestinal: Negative for abdominal pain, constipation, diarrhea, nausea and vomiting.  Endocrine: Positive for cold intolerance. Negative for heat intolerance, polydipsia and polyuria.  Genitourinary: Positive for menstrual problem and vaginal bleeding. Negative for dysuria and frequency.       Has seen GYN provider and will hae IUD placed after starting her next menstrual cycle.   Musculoskeletal: Positive for back pain. Negative for arthralgias and joint swelling.  Skin: Negative for rash.  Allergic/Immunologic: Negative for environmental allergies.  Neurological: Negative for dizziness, tremors, numbness and headaches.  Hematological: Negative for adenopathy. Does not bruise/bleed easily.  Psychiatric/Behavioral: Negative for behavioral problems (Depression), sleep disturbance and suicidal ideas. The patient is not nervous/anxious.      Today's Vitals   08/14/18 0842  BP: 117/78  Pulse: 71  Resp: 16  SpO2: 100%  Weight: 156 lb 3.2 oz (70.9 kg)  Height: 5\' 8"  (1.727 m)   Body mass index is 23.75 kg/m.   Physical Exam Vitals signs and nursing note reviewed.  Constitutional:      General: She is not  in acute distress.    Appearance: Normal appearance. She is well-developed. She is not diaphoretic.  HENT:     Head: Normocephalic and atraumatic.     Mouth/Throat:     Pharynx: No oropharyngeal exudate.  Eyes:     Conjunctiva/sclera: Conjunctivae normal.     Pupils: Pupils are equal, round, and reactive to light.  Neck:     Musculoskeletal: Normal range of motion and neck supple.     Thyroid: No thyromegaly.     Vascular:  No carotid bruit or JVD.     Trachea: No tracheal deviation.  Cardiovascular:     Rate and Rhythm: Normal rate and regular rhythm.     Pulses: Normal pulses.     Heart sounds: Normal heart sounds. No murmur. No friction rub. No gallop.   Pulmonary:     Effort: Pulmonary effort is normal. No respiratory distress.     Breath sounds: Normal breath sounds. No wheezing or rales.  Chest:     Chest wall: No tenderness.     Breasts:        Right: Normal. No swelling, bleeding, inverted nipple, mass, nipple discharge, skin change or tenderness.        Left: Normal. No swelling, bleeding, inverted nipple, mass, nipple discharge, skin change or tenderness.  Abdominal:     General: Bowel sounds are normal.     Palpations: Abdomen is soft.     Tenderness: There is no abdominal tenderness.  Musculoskeletal: Normal range of motion.  Lymphadenopathy:     Cervical: No cervical adenopathy.  Skin:    General: Skin is warm and dry.  Neurological:     Mental Status: She is alert and oriented to person, place, and time.     Cranial Nerves: No cranial nerve deficit.  Psychiatric:        Behavior: Behavior normal.        Thought Content: Thought content normal.        Judgment: Judgment normal.   Assessment/Plan: 1. Encounter for general adult medical examination with abnormal findings Annual health maintenance exam today.  - CBC with Differential/Platelet - Comprehensive metabolic panel - T4, free - TSH - Lipid panel  2. Uterine leiomyoma, unspecified location Large uterine fibroid. Will have conservative therapy with IUD placement when next menstrual cycle begins  3. Vitamin D deficiency Check vitamin d level. - Vitamin D 1,25 dihydroxy  4. Screening for breast cancer - MM DIGITAL SCREENING BILATERAL; Future  5. Screening for colon cancer - Ambulatory referral to Gastroenterology  6. Dysuria - UA/M w/rflx Culture, Routine  General Counseling: nancy arvin understanding of the  findings of todays visit and agrees with plan of treatment. I have discussed any further diagnostic evaluation that may be needed or ordered today. We also reviewed her medications today. she has been encouraged to call the office with any questions or concerns that should arise related to todays visit.    Counseling:  This patient was seen by Leretha Pol FNP Collaboration with Dr Lavera Guise as a part of collaborative care agreement  Orders Placed This Encounter  Procedures  . MM DIGITAL SCREENING BILATERAL  . UA/M w/rflx Culture, Routine  . CBC with Differential/Platelet  . Comprehensive metabolic panel  . T4, free  . TSH  . Lipid panel  . Vitamin D 1,25 dihydroxy  . Ambulatory referral to Gastroenterology    Time spent: [place], MD  Internal Medicine

## 2018-08-14 NOTE — Telephone Encounter (Signed)
PT IS CALLING FOR MICHELLE TO SCHEDULE A COLONOSCOPY

## 2018-08-14 NOTE — Telephone Encounter (Signed)
LVM for pt to call back to schedule her colonoscopy.  Thanks [employer]

## 2018-08-17 ENCOUNTER — Telehealth: Payer: Self-pay | Admitting: Obstetrics and Gynecology

## 2018-08-17 NOTE — Telephone Encounter (Signed)
FYI patient is scheduled for 08/21/2018.

## 2018-08-17 NOTE — Telephone Encounter (Signed)
The patient was not sure which IUD she and Dr. Amalia Hailey discussed other than it was the "lowest hormone" one, per patient.  She has set an appointment, but I was not able to put the type of IUD in the notes, please advise, thanks.

## 2018-08-18 LAB — MICROSCOPIC EXAMINATION
Bacteria, UA: NONE SEEN
Casts: NONE SEEN /LPF
Epithelial Cells (non renal): >10 /HPF — AB (ref 0–10)

## 2018-08-18 LAB — UA/M W/RFLX CULTURE, ROUTINE
Bilirubin, UA: NEGATIVE
Glucose, UA: NEGATIVE
Ketones, UA: NEGATIVE
Nitrite, UA: NEGATIVE
Protein, UA: NEGATIVE
RBC, UA: NEGATIVE
Specific Gravity, UA: 1.019 (ref 1.005–1.030)
Urobilinogen, Ur: 0.2 mg/dL (ref 0.2–1.0)
pH, UA: 5 (ref 5.0–7.5)

## 2018-08-18 LAB — URINE CULTURE, REFLEX

## 2018-08-21 ENCOUNTER — Ambulatory Visit: Payer: BC Managed Care – PPO | Admitting: Obstetrics and Gynecology

## 2018-08-27 ENCOUNTER — Ambulatory Visit (INDEPENDENT_AMBULATORY_CARE_PROVIDER_SITE_OTHER): Payer: BC Managed Care – PPO | Admitting: Obstetrics and Gynecology

## 2018-08-27 ENCOUNTER — Encounter: Payer: Self-pay | Admitting: Obstetrics and Gynecology

## 2018-08-27 ENCOUNTER — Other Ambulatory Visit: Payer: Self-pay

## 2018-08-27 VITALS — BP 119/75 | HR 75 | Ht 68.0 in | Wt 155.8 lb

## 2018-08-27 DIAGNOSIS — Z30430 Encounter for insertion of intrauterine contraceptive device: Secondary | ICD-10-CM | POA: Diagnosis not present

## 2018-08-27 NOTE — Progress Notes (Signed)
HPI:      Judith Gomez is a 48 y.o. No obstetric history on file. who LMP was No LMP recorded.  Subjective:   She presents today for IUD insertion.  She has a history of heavy menstrual bleeding likely secondary to her uterine fibroids.  She is trying to get some cycle control with her IUD.    Hx: The following portions of the patient's history were reviewed and updated as appropriate:             She  has a past medical history of Anemia, Arthritis, Chronic kidney disease, and Heart murmur. She does not have any pertinent problems on file. She  has a past surgical history that includes Cesarean section; Tubal ligation; Colonoscopy; Wisdom tooth extraction; Thumb fusion (Left, 2014); and Knee arthroscopy (Left, 11/18/2014). Her family history is not on file. She  reports that she has never smoked. She has never used smokeless tobacco. She reports that she does not drink alcohol or use drugs. She has a current medication list which includes the following prescription(s): acetaminophen, cholecalciferol, fluocinolone acetonide, and multivitamin. She is allergic to tape.       Review of Systems:  Review of Systems  Constitutional: Denied constitutional symptoms, night sweats, recent illness, fatigue, fever, insomnia and weight loss.  Eyes: Denied eye symptoms, eye pain, photophobia, vision change and visual disturbance.  Ears/Nose/Throat/Neck: Denied ear, nose, throat or neck symptoms, hearing loss, nasal discharge, sinus congestion and sore throat.  Cardiovascular: Denied cardiovascular symptoms, arrhythmia, chest pain/pressure, edema, exercise intolerance, orthopnea and palpitations.  Respiratory: Denied pulmonary symptoms, asthma, pleuritic pain, productive sputum, cough, dyspnea and wheezing.  Gastrointestinal: Denied, gastro-esophageal reflux, melena, nausea and vomiting.  Genitourinary: See HPI for additional information.  Musculoskeletal: Denied musculoskeletal symptoms, stiffness,  swelling, muscle weakness and myalgia.  Dermatologic: Denied dermatology symptoms, rash and scar.  Neurologic: Denied neurology symptoms, dizziness, headache, neck pain and syncope.  Psychiatric: Denied psychiatric symptoms, anxiety and depression.  Endocrine: Denied endocrine symptoms including hot flashes and night sweats.   Meds:   Current Outpatient Medications on File Prior to Visit  Medication Sig Dispense Refill  . acetaminophen (TYLENOL) 325 MG tablet Take 1 tablet by mouth.    . cholecalciferol (VITAMIN D) 1000 UNITS tablet Take 1,000 Units by mouth daily.    . Fluocinolone Acetonide 0.01 % OIL Place 5 drops in ear(s) 2 (two) times daily. 20 mL 0  . Multiple Vitamin (MULTIVITAMIN) capsule Take 1 capsule by mouth daily.     No current facility-administered medications on file prior to visit.     Objective:     Vitals:   08/27/18 0847  BP: 119/75  Pulse: 75    Physical examination   Pelvic:   Vulva: Normal appearance.  No lesions.  Vagina: No lesions or abnormalities noted.  Support: Normal pelvic support.  Urethra No masses tenderness or scarring.  Meatus Normal size without lesions or prolapse.  Cervix: Normal appearance.  No lesions.  Anus: Normal exam.  No lesions.  Perineum: Normal exam.  No lesions.        Bimanual   Uterus:  Top normal size non-tender.  Mobile.  AV.  Adnexae: No masses.  Non-tender to palpation.  Cul-de-sac: Negative for abnormality.   IUD Procedure Pt has read the booklet and signed the appropriate forms regarding the Mirena IUD.  All of her questions have been answered.  Prior to insertion the patient was made aware of her uterine fibroids and their location  as well as we know them.  We have previously discussed submucosal fibroids and the use of IUDs.  I do not believe her fibroid is submucosal although it is difficult to tell from her most recent ultrasound. The cervix was cleansed with betadine solution.  After sounding the uterus and  noting the position, the IUD was placed in the usual manner without problem.  The string was cut to the appropriate length.  The patient tolerated the procedure well.              Assessment:    No obstetric history on file. Patient Active Problem List   Diagnosis Date Noted  . Screening for colon cancer 08/14/2018  . Dysuria 08/14/2018  . Vitamin D deficiency 08/14/2018  . Excessive bleeding in premenopausal period 07/04/2018  . Uterine leiomyoma 07/04/2018  . Urinary tract infection with hematuria 04/05/2018  . Acute non-recurrent sinusitis 04/05/2018  . Acute low back pain 04/05/2018  . Chest pain 01/17/2018  . Generalized anxiety disorder 01/17/2018  . Acute otitis externa of both ears 01/17/2018  . Subacromial bursitis of right shoulder joint 12/15/2017  . Pain in right hand 09/18/2017  . 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 Melanoma ([place]) 01/10/2017  . History of anemia 01/17/2014  . Morton's neuroma 01/17/2014  . Multiple renal cysts 01/17/2014  . Psoriatic arthritis ([place]) 01/17/2014     1. Encounter for insertion of mirena IUD       Plan:             F/U  Return in about 4 weeks (around 09/24/2018) for For IUD f/u.  4 weeks for IUD check.  Finis Bud, M.D. 08/27/2018 11:20 AM

## 2018-09-03 ENCOUNTER — Ambulatory Visit: Payer: BLUE CROSS/BLUE SHIELD | Primary: Internal Medicine

## 2018-09-24 ENCOUNTER — Encounter: Payer: Self-pay | Admitting: *Deleted

## 2018-09-25 ENCOUNTER — Encounter: Payer: Self-pay | Admitting: Obstetrics and Gynecology

## 2018-10-01 ENCOUNTER — Telehealth: Payer: Self-pay | Admitting: Gastroenterology

## 2018-10-01 ENCOUNTER — Other Ambulatory Visit: Payer: Self-pay

## 2018-10-01 DIAGNOSIS — Z1211 Encounter for screening for malignant neoplasm of colon: Secondary | ICD-10-CM

## 2018-10-01 NOTE — Telephone Encounter (Signed)
Patient received letter & is ready to schedule colonoscopy.

## 2018-10-01 NOTE — Telephone Encounter (Signed)
Gastroenterology Pre-Procedure Review  Request Date: 01/04/19 Requesting Physician: Dr. Vicente Males  PATIENT REVIEW QUESTIONS: The patient responded to the following health history questions as indicated:    1. Are you having any GI issues? NO 2. Do you have a personal history of Polyps? NO 3. Do you have a family history of Colon Cancer or Polyps? Grand Father Colon Cance 4. Diabetes Mellitus? NO 5. Joint replacements in the past 12 months?NO 6. Major health problems in the past 3 months?NO 7. Any artificial heart valves, MVP, or defibrillator?NO MEDICATIONS & ALLERGIES:    Patient reports the following regarding taking any anticoagulation/antiplatelet therapy:   Plavix, Coumadin, Eliquis, Xarelto, Lovenox, Pradaxa, Brilinta, or Effient? NO Aspirin? NO  Patient confirms/reports the following medications:  Current Outpatient Medications  Medication Sig Dispense Refill  . acetaminophen (TYLENOL) 325 MG tablet Take 1 tablet by mouth.    . cholecalciferol (VITAMIN D) 1000 UNITS tablet Take 1,000 Units by mouth daily.    . Fluocinolone Acetonide 0.01 % OIL Place 5 drops in ear(s) 2 (two) times daily. 20 mL 0  . Multiple Vitamin (MULTIVITAMIN) capsule Take 1 capsule by mouth daily.     No current facility-administered medications for this visit.     Patient confirms/reports the following allergies:  Allergies  Allergen Reactions  . Tape Rash    Surgical tape used for C-section    No orders of the defined types were placed in this encounter.   AUTHORIZATION INFORMATION Primary Insurance: 1D#: Group #:  Secondary Insurance: 1D#: Group #:  SCHEDULE INFORMATION: Date:01/04/19  Time: Location:[facility]

## 2018-10-05 ENCOUNTER — Telehealth: Payer: Self-pay

## 2018-10-05 NOTE — Telephone Encounter (Signed)
Pt called had a tick bite on forehead no other symptoms as per heather advised keep clean and warm water and call us back if you have fever or any other symptoms we can make video call appt

## 2018-10-30 ENCOUNTER — Encounter

## 2018-10-30 ENCOUNTER — Inpatient Hospital Stay: Admit: 2018-10-30 | Payer: BLUE CROSS/BLUE SHIELD | Primary: Internal Medicine

## 2018-10-30 DIAGNOSIS — R928 Other abnormal and inconclusive findings on diagnostic imaging of breast: Secondary | ICD-10-CM

## 2018-11-07 ENCOUNTER — Other Ambulatory Visit: Payer: Self-pay | Admitting: Nurse Practitioner

## 2018-11-07 LAB — LIPID PANEL W/O CHOL/HDL RATIO

## 2018-11-08 LAB — COMPREHENSIVE METABOLIC PANEL
ALT: 14 IU/L (ref 0–32)
AST: 18 IU/L (ref 0–40)
Albumin/Globulin Ratio: 2 (ref 1.2–2.2)
Albumin: 4.7 g/dL (ref 3.8–4.8)
Alkaline Phosphatase: 78 IU/L (ref 39–117)
BUN/Creatinine Ratio: 14 (ref 9–23)
BUN: 15 mg/dL (ref 6–24)
Bilirubin Total: 0.5 mg/dL (ref 0.0–1.2)
CO2: 25 mmol/L (ref 20–29)
Calcium: 9.7 mg/dL (ref 8.7–10.2)
Chloride: 102 mmol/L (ref 96–106)
Creatinine, Ser: 1.07 mg/dL — ABNORMAL HIGH (ref 0.57–1.00)
GFR calc Af Amer: 70 mL/min/{1.73_m2} (ref >59)
GFR calc non Af Amer: 61 mL/min/{1.73_m2} (ref >59)
Globulin, Total: 2.4 g/dL (ref 1.5–4.5)
Glucose: 117 mg/dL — ABNORMAL HIGH (ref 65–99)
Potassium: 4.8 mmol/L (ref 3.5–5.2)
Sodium: 141 mmol/L (ref 134–144)
Total Protein: 7.1 g/dL (ref 6.0–8.5)

## 2018-11-08 LAB — TSH: TSH: 4.76 u[IU]/mL — ABNORMAL HIGH (ref 0.450–4.500)

## 2018-11-08 LAB — CBC
Hematocrit: 38 % (ref 34.0–46.6)
Hemoglobin: 12.8 g/dL (ref 11.1–15.9)
MCH: 29.9 pg (ref 26.6–33.0)
MCHC: 33.7 g/dL (ref 31.5–35.7)
MCV: 89 fL (ref 79–97)
Platelets: 245 x10E3/uL (ref 150–450)
RBC: 4.28 x10E6/uL (ref 3.77–5.28)
RDW: 12.5 % (ref 11.7–15.4)
WBC: 3.5 x10E3/uL (ref 3.4–10.8)

## 2018-11-08 LAB — LIPID PANEL W/O CHOL/HDL RATIO
Cholesterol, Total: 226 mg/dL — ABNORMAL HIGH (ref 100–199)
HDL: 64 mg/dL (ref >39)
LDL Calculated: 150 mg/dL — ABNORMAL HIGH (ref 0–99)
Triglycerides: 60 mg/dL (ref 0–149)
VLDL Cholesterol Cal: 12 mg/dL (ref 5–40)

## 2018-11-08 LAB — T3: T3, Total: 123 ng/dL (ref 71–180)

## 2018-11-08 LAB — T4, FREE: Free T4: 1.1 ng/dL (ref 0.82–1.77)

## 2018-11-08 LAB — VITAMIN D 25 HYDROXY (VIT D DEFICIENCY, FRACTURES): Vit D, 25-Hydroxy: 19.4 ng/mL — ABNORMAL LOW (ref 30.0–100.0)

## 2018-11-09 ENCOUNTER — Encounter: Payer: Self-pay | Admitting: Nurse Practitioner

## 2018-11-09 ENCOUNTER — Other Ambulatory Visit: Payer: Self-pay

## 2018-11-09 ENCOUNTER — Ambulatory Visit: Payer: BC Managed Care – PPO | Admitting: Nurse Practitioner

## 2018-11-09 VITALS — Ht 68.0 in | Wt 166.0 lb

## 2018-11-09 DIAGNOSIS — H6690 Otitis media, unspecified, unspecified ear: Secondary | ICD-10-CM

## 2018-11-09 DIAGNOSIS — E559 Vitamin D deficiency, unspecified: Secondary | ICD-10-CM | POA: Diagnosis not present

## 2018-11-09 MED ORDER — SULFAMETHOXAZOLE-TRIMETHOPRIM 800-160 MG PO TABS: 1.0000 | ORAL_TABLET | Freq: Two times a day (BID) | ORAL | 0 refills | Status: DC

## 2018-11-09 MED ORDER — ERGOCALCIFEROL 1.25 MG (50000 UT) PO CAPS: 50000.0000 [IU] | ORAL_CAPSULE | ORAL | 5 refills | Status: DC

## 2018-11-09 NOTE — Progress Notes (Signed)
[facility] [place], [place] [address]  Internal MEDICINE  Telephone Visit  Patient Name: Judith Gomez  [id number]  [id number]  Date of Service: 11/19/2018  I connected with the patient at 1:49pm by webcam and verified the patients identity using two identifiers.   I discussed the limitations, risks, security and privacy concerns of performing an evaluation and management service by webcam and the availability of in person appointments. I also discussed with the patient that there may be a patient responsible charge related to the service.  The patient expressed understanding and agrees to proceed.    Chief Complaint  Patient presents with  . Telephone Assessment  . Telephone Screen  . Headache  . Ear Pain    left  . Sinusitis    The patient has been contacted via webcam for follow up visit due to concerns for spread of novel coronavirus. The patient states that she has left ear pain with yellow/brown fluid draining from the ear after she showers. She has headache and sinus pressure over the left eye and cheek. She denies fever. She states that she had testing done for COVID 19 when symptoms first started. Her test results were negative.       Current Medication: Outpatient Encounter Medications as of 11/09/2018  Medication Sig  . acetaminophen (TYLENOL) 325 MG tablet Take 1 tablet by mouth.  . cholecalciferol (VITAMIN D) 1000 UNITS tablet Take 1,000 Units by mouth daily.  . Fluocinolone Acetonide 0.01 % OIL Place 5 drops in ear(s) 2 (two) times daily.  . Multiple Vitamin (MULTIVITAMIN) capsule Take 1 capsule by mouth daily.  . ergocalciferol (DRISDOL) 1.25 MG (50000 UT) capsule Take 1 capsule (50,000 Units total) by mouth once a week.  . sulfamethoxazole-trimethoprim (BACTRIM DS) 800-160 MG tablet Take 1 tablet by mouth 2 (two) times daily.   No facility-administered encounter medications on file as of 11/09/2018.     Surgical History: Past Surgical  History:  Procedure Laterality Date  . CESAREAN SECTION    . COLONOSCOPY    . KNEE ARTHROSCOPY Left 11/18/2014   Procedure: ARTHROSCOPY KNEE;  Surgeon: Hessie Knows, MD;  Location: [facility] ORS;  Service: Orthopedics;  Laterality: Left;   partial medial menisectomy  . THUMB FUSION Left 2014  . TUBAL LIGATION    . WISDOM TOOTH EXTRACTION      Medical History: Past Medical History:  Diagnosis Date  . Anemia   . Arthritis   . Chronic kidney disease   . Heart murmur     Family History: Family History  Problem Relation Age of Onset  . Breast cancer Neg Hx     Social History   Socioeconomic History  . Marital status: Married    Spouse name: Not on file  . Number of children: Not on file  . Years of education: Not on file  . Highest education level: Not on file  Occupational History  . Not on file  Social Needs  . Financial resource strain: Not on file  . Food insecurity:    Worry: Not on file    Inability: Not on file  . Transportation needs:    Medical: Not on file    Non-medical: Not on file  Tobacco Use  . Smoking status: Never Smoker  . Smokeless tobacco: Never Used  Substance and Sexual Activity  . Alcohol use: No  . Drug use: No  . Sexual activity: Not on file  Lifestyle  . Physical activity:  Days per week: Not on file    Minutes per session: Not on file  . Stress: Not on file  Relationships  . Social connections:    Talks on phone: Not on file    Gets together: Not on file    Attends religious service: Not on file    Active member of club or organization: Not on file    Attends meetings of clubs or organizations: Not on file    Relationship status: Not on file  . Intimate partner violence:    Fear of current or ex partner: Not on file    Emotionally abused: Not on file    Physically abused: Not on file    Forced sexual activity: Not on file  Other Topics Concern  . Not on file  Social History Narrative  . Not on file      Review of Systems   Constitutional: Positive for chills and fatigue.  HENT: Positive for congestion, ear pain, postnasal drip, rhinorrhea and sinus pain. Negative for sore throat and voice change.   Respiratory: Positive for cough. Negative for wheezing.   Cardiovascular: Negative for chest pain and palpitations.  Allergic/Immunologic: Positive for environmental allergies.  Neurological: Positive for headaches.  Hematological: Positive for adenopathy.    Today's Vitals   11/09/18 1344  Weight: 166 lb (75.3 kg)  Height: 5\' 8"  (1.727 m)   Body mass index is 25.24 kg/m.  Observation/Objective:    The patient is alert and oriented. She is pleasant and answers all questions appropriately. Breathing is non-labored. She is in no acute distress at this time. The patient looks as though she does not feel well. She has nasal congestion.   Assessment/Plan: 1. Acute otitis media, unspecified otitis media type Start bactrim DS bid for 10 days. Rest and increase fluids. Use OTC medication as needed and as indicated to improve symptoms.  - sulfamethoxazole-trimethoprim (BACTRIM DS) 800-160 MG tablet; Take 1 tablet by mouth 2 (two) times daily.  Dispense: 20 tablet; Refill: 0  2. Vitamin D deficiency - ergocalciferol (DRISDOL) 1.25 MG (50000 UT) capsule; Take 1 capsule (50,000 Units total) by mouth once a week.  Dispense: 4 capsule; Refill: 5  General Counseling: tawyna pellot understanding of the findings of today's phone visit and agrees with plan of treatment. I have discussed any further diagnostic evaluation that may be needed or ordered today. We also reviewed her medications today. she has been encouraged to call the office with any questions or concerns that should arise related to todays visit.  Rest and increase fluids. Continue using OTC medication to control symptoms.   This patient was seen by [place] with Dr Lavera Guise as a part of collaborative care agreement  Meds  ordered this encounter  Medications  . sulfamethoxazole-trimethoprim (BACTRIM DS) 800-160 MG tablet    Sig: Take 1 tablet by mouth 2 (two) times daily.    Dispense:  20 tablet    Refill:  0    Order Specific Question:   Supervising Provider    Answer:   Lavera Guise [[id number]]  . ergocalciferol (DRISDOL) 1.25 MG (50000 UT) capsule    Sig: Take 1 capsule (50,000 Units total) by mouth once a week.    Dispense:  4 capsule    Refill:  5    Order Specific Question:   Supervising Provider    Answer:   Lavera Guise [[id number]]    Time spent: 13 Minutes    Dr Latricia Heft  [facility] Internal [facility]

## 2018-11-15 ENCOUNTER — Ambulatory Visit: Payer: BC Managed Care – PPO | Admitting: Obstetrics and Gynecology

## 2018-11-15 ENCOUNTER — Other Ambulatory Visit: Payer: Self-pay

## 2018-11-15 ENCOUNTER — Encounter: Payer: Self-pay | Admitting: Obstetrics and Gynecology

## 2018-11-15 VITALS — BP 127/81 | HR 73 | Ht 68.0 in | Wt 158.6 lb

## 2018-11-15 DIAGNOSIS — Z30431 Encounter for routine checking of intrauterine contraceptive device: Secondary | ICD-10-CM | POA: Diagnosis not present

## 2018-11-15 DIAGNOSIS — N920 Excessive and frequent menstruation with regular cycle: Secondary | ICD-10-CM

## 2018-11-15 NOTE — Progress Notes (Signed)
HPI:      [name] is a 48 y.o. No obstetric history on file. who LMP was No LMP recorded. (Menstrual status: IUD).  Subjective:   She presents today for IUD follow-up.  She has had issues with menorrhagia and uterine fibroids.  She says the first month and a half of her IUD she had intermittent bleeding but this is now stopped.  She also states that she has had some hot flashes.  But when describing these she describes them as 10 to 15 seconds in length and have been 1-2 times per day.  She denies having hot flashes through the night.    Hx: The following portions of the patient's history were reviewed and updated as appropriate:             She  has a past medical history of Anemia, Arthritis, Chronic kidney disease, and Heart murmur. She does not have any pertinent problems on file. She  has a past surgical history that includes Cesarean section; Tubal ligation; Colonoscopy; Wisdom tooth extraction; Thumb fusion (Left, 2014); and Knee arthroscopy (Left, 11/18/2014). Her family history is not on file. She  reports that she has never smoked. She has never used smokeless tobacco. She reports that she does not drink alcohol or use drugs. She has a current medication list which includes the following prescription(s): acetaminophen, cholecalciferol, ergocalciferol, fluocinolone acetonide, multivitamin, and sulfamethoxazole-trimethoprim. She is allergic to tape.       Review of Systems:  Review of Systems  Constitutional: Denied constitutional symptoms, night sweats, recent illness, fatigue, fever, insomnia and weight loss.  Eyes: Denied eye symptoms, eye pain, photophobia, vision change and visual disturbance.  Ears/Nose/Throat/Neck: Denied ear, nose, throat or neck symptoms, hearing loss, nasal discharge, sinus congestion and sore throat.  Cardiovascular: Denied cardiovascular symptoms, arrhythmia, chest pain/pressure, edema, exercise intolerance, orthopnea and palpitations.   Respiratory: Denied pulmonary symptoms, asthma, pleuritic pain, productive sputum, cough, dyspnea and wheezing.  Gastrointestinal: Denied, gastro-esophageal reflux, melena, nausea and vomiting.  Genitourinary: Denied genitourinary symptoms including symptomatic vaginal discharge, pelvic relaxation issues, and urinary complaints.  Musculoskeletal: Denied musculoskeletal symptoms, stiffness, swelling, muscle weakness and myalgia.  Dermatologic: Denied dermatology symptoms, rash and scar.  Neurologic: Denied neurology symptoms, dizziness, headache, neck pain and syncope.  Psychiatric: Denied psychiatric symptoms, anxiety and depression.  Endocrine: Denied endocrine symptoms including hot flashes and night sweats.   Meds:   Current Outpatient Medications on File Prior to Visit  Medication Sig Dispense Refill  . acetaminophen (TYLENOL) 325 MG tablet Take 1 tablet by mouth.    . cholecalciferol (VITAMIN D) 1000 UNITS tablet Take 1,000 Units by mouth daily.    . ergocalciferol (DRISDOL) 1.25 MG (50000 UT) capsule Take 1 capsule (50,000 Units total) by mouth once a week. 4 capsule 5  . Fluocinolone Acetonide 0.01 % OIL Place 5 drops in ear(s) 2 (two) times daily. 20 mL 0  . Multiple Vitamin (MULTIVITAMIN) capsule Take 1 capsule by mouth daily.    Marland Kitchen sulfamethoxazole-trimethoprim (BACTRIM DS) 800-160 MG tablet Take 1 tablet by mouth 2 (two) times daily. 20 tablet 0   No current facility-administered medications on file prior to visit.     Objective:     Vitals:   11/15/18 0853  BP: 127/81  Pulse: 73              Physical examination   Pelvic:   Vulva: Normal appearance.  No lesions.  Vagina: No lesions or abnormalities noted.  Support: Normal pelvic  support.  Urethra No masses tenderness or scarring.  Meatus Normal size without lesions or prolapse.  Cervix: Normal appearance.  No lesions. IUD strings noted at cervical os.  Anus: Normal exam.  No lesions.  Perineum: Normal exam.  No  lesions.        Bimanual   Uterus:  Top normal size  non-tender.  Mobile.  AV.  Adnexae: No masses.  Non-tender to palpation.  Cul-de-sac: Negative for abnormality.     Assessment:    No obstetric history on file. Patient Active Problem List   Diagnosis Date Noted  . Screening for colon cancer 08/14/2018  . Dysuria 08/14/2018  . Vitamin D deficiency 08/14/2018  . Excessive bleeding in premenopausal period 07/04/2018  . Uterine leiomyoma 07/04/2018  . Urinary tract infection with hematuria 04/05/2018  . Acute non-recurrent sinusitis 04/05/2018  . Acute low back pain 04/05/2018  . Chest pain 01/17/2018  . Generalized anxiety disorder 01/17/2018  . Acute otitis externa of both ears 01/17/2018  . Subacromial bursitis of right shoulder joint 12/15/2017  . Pain in right hand 09/18/2017  . 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 Melanoma ([place]) 01/10/2017  . History of anemia 01/17/2014  . Morton's neuroma 01/17/2014  . Multiple renal cysts 01/17/2014  . Psoriatic arthritis ([place]) 01/17/2014     1. Surveillance of previously prescribed intrauterine contraceptive device   2. Menorrhagia with regular cycle     Patient currently doing well with IUD.  Not bleeding.  Patient's description of hot flashes is not likely consistent with menopausal hot flashes.  Possibly climacteric.   Plan:            1.  Expectant management of IUD and vaginal bleeding.  If her bleeding continues and is heavy not acceptable she says that she is ready for hysterectomy.  2.  Patient to contact us if menopausal symptoms become worse-consider [facility] at that time. Orders No orders of the defined types were placed in this encounter.   No orders of the defined types were placed in this encounter.     F/U  Return for Pt to contact us if symptoms worsen. I spent 17 minutes involved in the care of this patient of which greater than 50% was spent discussing IUD, fibroids, menorrhagia, hot  flashes and menopause.  All questions answered  Finis Bud, M.D. 11/15/2018 9:25 AM

## 2018-11-15 NOTE — Progress Notes (Signed)
Patient comes in today for IUD string check. She bleed some in May. She is having some hot flashes.

## 2018-11-19 DIAGNOSIS — H6690 Otitis media, unspecified, unspecified ear: Secondary | ICD-10-CM | POA: Insufficient documentation

## 2018-11-29 NOTE — Progress Notes (Signed)
Pt needs to have a follow up regarding her labs, can be a video call

## 2018-12-04 ENCOUNTER — Telehealth: Payer: Self-pay

## 2018-12-04 NOTE — Telephone Encounter (Signed)
Left message and asked pt to call back and schedule video visit for lab follow up. Beth

## 2018-12-06 NOTE — Progress Notes (Signed)
Can HB discuss her elevated TSH with me please

## 2018-12-09 NOTE — Progress Notes (Signed)
Yes    Will do

## 2018-12-20 ENCOUNTER — Other Ambulatory Visit: Payer: Self-pay | Admitting: Family Medicine

## 2018-12-20 DIAGNOSIS — Z20822 Contact with and (suspected) exposure to covid-19: Secondary | ICD-10-CM

## 2018-12-26 LAB — NOVEL CORONAVIRUS, NAA: SARS-CoV-2, NAA: NOT DETECTED

## 2018-12-31 ENCOUNTER — Other Ambulatory Visit: Admission: RE | Admit: 2018-12-31 | Payer: BC Managed Care – PPO | Source: Ambulatory Visit

## 2018-12-31 ENCOUNTER — Other Ambulatory Visit: Payer: Self-pay

## 2018-12-31 ENCOUNTER — Other Ambulatory Visit
Admission: RE | Admit: 2018-12-31 | Discharge: 2018-12-31 | Disposition: A | Discharge status: Home / Self Care | Payer: BC Managed Care – PPO | Source: Ambulatory Visit | Attending: Gastroenterology | Admitting: Gastroenterology

## 2018-12-31 DIAGNOSIS — Z20828 Contact with and (suspected) exposure to other viral communicable diseases: Secondary | ICD-10-CM | POA: Insufficient documentation

## 2018-12-31 DIAGNOSIS — Z1159 Encounter for screening for other viral diseases: Secondary | ICD-10-CM | POA: Diagnosis present

## 2019-01-01 ENCOUNTER — Other Ambulatory Visit: Payer: BC Managed Care – PPO

## 2019-01-01 LAB — SARS CORONAVIRUS 2 (TAT 6-24 HRS): SARS Coronavirus 2: NEGATIVE

## 2019-01-03 ENCOUNTER — Encounter: Payer: Self-pay | Admitting: *Deleted

## 2019-01-04 ENCOUNTER — Ambulatory Visit: Payer: BC Managed Care – PPO | Admitting: Certified Registered Nurse Anesthetist

## 2019-01-04 ENCOUNTER — Ambulatory Visit
Admission: RE | Admit: 2019-01-04 | Discharge: 2019-01-04 | Disposition: A | Discharge status: Home / Self Care | Payer: BC Managed Care – PPO | Attending: Gastroenterology | Admitting: Gastroenterology

## 2019-01-04 ENCOUNTER — Encounter
Admission: RE | Disposition: A | Discharge status: Home / Self Care | Payer: Self-pay | Source: Home / Self Care | Attending: Gastroenterology

## 2019-01-04 ENCOUNTER — Encounter: Payer: Self-pay | Admitting: *Deleted

## 2019-01-04 ENCOUNTER — Other Ambulatory Visit: Payer: Self-pay

## 2019-01-04 DIAGNOSIS — D122 Benign neoplasm of ascending colon: Secondary | ICD-10-CM | POA: Insufficient documentation

## 2019-01-04 DIAGNOSIS — N189 Chronic kidney disease, unspecified: Secondary | ICD-10-CM | POA: Diagnosis not present

## 2019-01-04 DIAGNOSIS — D123 Benign neoplasm of transverse colon: Secondary | ICD-10-CM | POA: Insufficient documentation

## 2019-01-04 DIAGNOSIS — Z1211 Encounter for screening for malignant neoplasm of colon: Secondary | ICD-10-CM | POA: Diagnosis present

## 2019-01-04 DIAGNOSIS — K635 Polyp of colon: Secondary | ICD-10-CM

## 2019-01-04 DIAGNOSIS — K640 First degree hemorrhoids: Secondary | ICD-10-CM | POA: Diagnosis not present

## 2019-01-04 HISTORY — PX: COLONOSCOPY WITH PROPOFOL: SHX5780

## 2019-01-04 LAB — POCT PREGNANCY, URINE: Preg Test, Ur: NEGATIVE

## 2019-01-04 SURGERY — COLONOSCOPY WITH PROPOFOL
Anesthesia: General

## 2019-01-04 MED ORDER — SODIUM CHLORIDE 0.9 % IV SOLN
INTRAVENOUS | Status: DC
  Administered 2019-01-04: 1000 mL via INTRAVENOUS

## 2019-01-04 MED ORDER — PROPOFOL 500 MG/50ML IV EMUL
INTRAVENOUS | Status: DC | PRN
  Administered 2019-01-04: 125 ug/kg/min via INTRAVENOUS

## 2019-01-04 MED ORDER — PROPOFOL 10 MG/ML IV BOLUS
INTRAVENOUS | Status: DC | PRN
  Administered 2019-01-04 (×3): 50 mg via INTRAVENOUS

## 2019-01-04 MED ORDER — LIDOCAINE HCL (CARDIAC) PF 100 MG/5ML IV SOSY
PREFILLED_SYRINGE | INTRAVENOUS | Status: DC | PRN
  Administered 2019-01-04: 50 mg via INTRAVENOUS

## 2019-01-04 NOTE — Transfer of Care (Signed)
Immediate Anesthesia Transfer of Care Note  Patient: Judith Gomez  Procedure(s) Performed: COLONOSCOPY WITH PROPOFOL (N/A )  Patient Location: PACU  Anesthesia Type:General  Level of Consciousness: awake, alert  and oriented  Airway & Oxygen Therapy: Patient Spontanous Breathing  Post-op Assessment: Report given to RN and Post -op Vital signs reviewed and stable  Post vital signs: Reviewed and stable  Last Vitals:  Vitals Value Taken Time  BP 113/68 01/04/19 1250  Temp 36.7 C 01/04/19 1250  Pulse 84 01/04/19 1250  Resp 18 01/04/19 1250  SpO2 100 % 01/04/19 1250  Vitals shown include unvalidated device data.  Last Pain:  Vitals:   01/04/19 1250  TempSrc: Tympanic  PainSc:          Complications: No apparent anesthesia complications

## 2019-01-04 NOTE — Anesthesia Preprocedure Evaluation (Signed)
Anesthesia Evaluation  Patient identified by MRN, date of birth, ID band Patient awake    Reviewed: Allergy & Precautions, H&P , NPO status , Patient's Chart, lab work & pertinent test results  History of Anesthesia Complications Negative for: history of anesthetic complications  Airway Mallampati: III  TM Distance: >3 FB Neck ROM: full    Dental  (+) Chipped   Pulmonary neg pulmonary ROS, neg shortness of breath,           Cardiovascular Exercise Tolerance: Good (-) angina(-) Past MI and (-) DOE + Valvular Problems/Murmurs      Neuro/Psych PSYCHIATRIC DISORDERS  Neuromuscular disease negative psych ROS   GI/Hepatic negative GI ROS, Neg liver ROS, neg GERD  ,  Endo/Other  negative endocrine ROS  Renal/GU Renal disease  negative genitourinary   Musculoskeletal  (+) Arthritis ,   Abdominal   Peds  Hematology negative hematology ROS (+)   Anesthesia Other Findings Past Medical History: No date: Anemia No date: Arthritis No date: Chronic kidney disease No date: Heart murmur  Past Surgical History: No date: CESAREAN SECTION No date: COLONOSCOPY 11/18/2014: KNEE ARTHROSCOPY; Left     Comment:  Procedure: ARTHROSCOPY KNEE;  Surgeon: Hessie Knows, MD;              Location: [facility] ORS;  Service: Orthopedics;  Laterality:               Left;   partial medial menisectomy 2014: THUMB FUSION; Left No date: TUBAL LIGATION No date: WISDOM TOOTH EXTRACTION  BMI    Body Mass Index: 24.02 kg/m      Reproductive/Obstetrics negative OB ROS                             Anesthesia Physical Anesthesia Plan  ASA: III  Anesthesia Plan: General   Post-op Pain Management:    Induction: Intravenous  PONV Risk Score and Plan: Propofol infusion and TIVA  Airway Management Planned: Natural Airway and Nasal Cannula  Additional Equipment:   Intra-op Plan:   Post-operative Plan:   Informed  Consent: I have reviewed the patients History and Physical, chart, labs and discussed the procedure including the risks, benefits and alternatives for the proposed anesthesia with the patient or authorized representative who has indicated his/her understanding and acceptance.     Dental Advisory Given  Plan Discussed with: Anesthesiologist, CRNA and Surgeon  Anesthesia Plan Comments: (Patient consented for risks of anesthesia including but not limited to:  - adverse reactions to medications - risk of intubation if required - damage to teeth, lips or other oral mucosa - sore throat or hoarseness - Damage to heart, brain, lungs or loss of life  Patient voiced understanding.)        Anesthesia Quick Evaluation

## 2019-01-04 NOTE — Op Note (Signed)
[facility] Gastroenterology Patient Name: Judith Gomez Procedure Date: 01/04/2019 12:18 PM MRN: [id number] Account #: [id number] Date of Birth: 1970-11-25 Admit Type: Outpatient Age: 48 Room: [facility] ENDO ROOM 4 Gender: Female Note Status: Finalized Procedure:            Colonoscopy Indications:          Screening for colorectal malignant neoplasm Providers:            Jonathon Bellows MD, MD Medicines:            Monitored Anesthesia Care Complications:        No immediate complications. Procedure:            Pre-Anesthesia Assessment:                       - Prior to the procedure, a History and Physical was                        performed, and patient medications, allergies and                        sensitivities were reviewed. The patient's tolerance of                        previous anesthesia was reviewed.                       - The risks and benefits of the procedure and the                        sedation options and risks were discussed with the                        patient. All questions were answered and informed                        consent was obtained.                       - ASA Grade Assessment: II - A patient with mild                        systemic disease.                       After obtaining informed consent, the colonoscope was                        passed under direct vision. Throughout the procedure,                        the patient's blood pressure, pulse, and oxygen                        saturations were monitored continuously. The                        Colonoscope was introduced through the anus and                        advanced to the the cecum, identified by the  appendiceal orifice, IC valve and transillumination.                        The colonoscopy was performed with ease. The patient                        tolerated the procedure well. The quality of the bowel                        preparation was  excellent. Findings:      The perianal and digital rectal examinations were normal.      Non-bleeding internal hemorrhoids were found during retroflexion. The       hemorrhoids were large and Grade I (internal hemorrhoids that do not       prolapse).      Two sessile polyps were found in the transverse colon and ascending       colon. The polyps were 5 to 8 mm in size. These polyps were removed with       a cold snare. Resection and retrieval were complete.      The exam was otherwise without abnormality on direct and retroflexion       views. Impression:           - Non-bleeding internal hemorrhoids.                       - Two 5 to 8 mm polyps in the transverse colon and in                        the ascending colon, removed with a cold snare.                        Resected and retrieved.                       - The examination was otherwise normal on direct and                        retroflexion views. Recommendation:       - Discharge patient to home (with escort).                       - Resume previous diet.                       - Continue present medications.                       - Await pathology results.                       - Repeat colonoscopy for surveillance based on                        pathology results. Procedure Code(s):    --- Professional ---                       [id number], Colonoscopy, flexible; with removal of tumor(s),                        polyp(s), or other lesion(s) by snare technique Diagnosis Code(s):    ---  Professional ---                       Z12.11, Encounter for screening for malignant neoplasm                        of colon                       K64.0, First degree hemorrhoids                       K63.5, Polyp of colon CPT copyright 2019 American Medical Association. All rights reserved. The codes documented in this report are preliminary and upon coder review may  be revised to meet current compliance requirements. Jonathon Bellows, MD Jonathon Bellows  MD, MD 01/04/2019 12:48:25 PM This report has been signed electronically. Number of Addenda: 0 Note Initiated On: 01/04/2019 12:18 PM Scope Withdrawal Time: 0 hours 14 minutes 22 seconds  Total Procedure Duration: 0 hours 21 minutes 13 seconds  Estimated Blood Loss: Estimated blood loss: none.      [facility]

## 2019-01-04 NOTE — Anesthesia Postprocedure Evaluation (Signed)
Anesthesia Post Note  Patient: Judith Gomez  Procedure(s) Performed: COLONOSCOPY WITH PROPOFOL (N/A )  Patient location during evaluation: Endoscopy Anesthesia Type: General Level of consciousness: awake and alert Pain management: pain level controlled Vital Signs Assessment: post-procedure vital signs reviewed and stable Respiratory status: spontaneous breathing, nonlabored ventilation, respiratory function stable and patient connected to nasal cannula oxygen Cardiovascular status: blood pressure returned to baseline and stable Postop Assessment: no apparent nausea or vomiting Anesthetic complications: no     Last Vitals:  Vitals:   01/04/19 1037 01/04/19 1250  BP: 116/84 113/68  Pulse: 77   Resp: 16   Temp: 37.1 C 36.7 C  SpO2: 100%     Last Pain:  Vitals:   01/04/19 1320  TempSrc:   PainSc: 0-No pain                 Precious Haws Zella Dewan

## 2019-01-04 NOTE — H&P (Signed)
Jonathon Bellows, MD [address] [place], [place], [place], [address] [id number], [place], [place], [place], [address] Phone: [phone]  Fax: [phone]  Primary Care Physician:  Ronnell Freshwater, NP   Pre-Procedure History & Physical: HPI:  Judith Gomez is a 48 y.o. female is here for an colonoscopy.   Past Medical History:  Diagnosis Date  . Anemia   . Arthritis   . Chronic kidney disease   . Heart murmur     Past Surgical History:  Procedure Laterality Date  . CESAREAN SECTION    . COLONOSCOPY    . KNEE ARTHROSCOPY Left 11/18/2014   Procedure: ARTHROSCOPY KNEE;  Surgeon: Hessie Knows, MD;  Location: [facility] ORS;  Service: Orthopedics;  Laterality: Left;   partial medial menisectomy  . THUMB FUSION Left 2014  . TUBAL LIGATION    . WISDOM TOOTH EXTRACTION      Prior to Admission medications   Medication Sig Start Date End Date Taking? Authorizing Provider  acetaminophen (TYLENOL) 325 MG tablet Take 1 tablet by mouth.   Yes [provider]  cholecalciferol (VITAMIN D) 1000 UNITS tablet Take 1,000 Units by mouth daily.   Yes [provider]  Fluocinolone Acetonide 0.01 % OIL Place 5 drops in ear(s) 2 (two) times daily. 01/03/18  Yes Ronnell Freshwater, NP  Multiple Vitamin (MULTIVITAMIN) capsule Take 1 capsule by mouth daily.   Yes [provider]  ergocalciferol (DRISDOL) 1.25 MG (50000 UT) capsule Take 1 capsule (50,000 Units total) by mouth once a week. Patient not taking: Reported on 01/04/2019 11/09/18   Ronnell Freshwater, NP  sulfamethoxazole-trimethoprim (BACTRIM DS) 800-160 MG tablet Take 1 tablet by mouth 2 (two) times daily. Patient not taking: Reported on 01/04/2019 11/09/18   Ronnell Freshwater, NP    Allergies as of 10/02/2018 - Review Complete 08/27/2018  Allergen Reaction Noted  . Tape Rash 11/11/2014    Family History  Problem Relation Age of Onset  . Breast cancer Neg Hx     Social History   Socioeconomic History   . Marital status: Married    Spouse name: Not on file  . Number of children: Not on file  . Years of education: Not on file  . Highest education level: Not on file  Occupational History  . Not on file  Social Needs  . Financial resource strain: Not on file  . Food insecurity    Worry: Not on file    Inability: Not on file  . Transportation needs    Medical: Not on file    Non-medical: Not on file  Tobacco Use  . Smoking status: Never Smoker  . Smokeless tobacco: Never Used  Substance and Sexual Activity  . Alcohol use: No  . Drug use: No  . Sexual activity: Not on file  Lifestyle  . Physical activity    Days per week: Not on file    Minutes per session: Not on file  . Stress: Not on file  Relationships  . Social Herbalist on phone: Not on file    Gets together: Not on file    Attends religious service: Not on file    Active member of club or organization: Not on file    Attends meetings of clubs or organizations: Not on file    Relationship status: Not on file  . Intimate partner violence    Fear of current or ex partner: Not on file  Emotionally abused: Not on file    Physically abused: Not on file    Forced sexual activity: Not on file  Other Topics Concern  . Not on file  Social History Narrative  . Not on file    Review of Systems: See HPI, otherwise negative ROS  Physical Exam: BP 116/84   Pulse 77   Temp 98.8 F (37.1 C) (Tympanic)   Resp 16   Ht 5\' 8"  (1.727 m)   Wt 71.7 kg   SpO2 100%   BMI 24.02 kg/m  General:   Alert,  pleasant and cooperative in NAD Head:  Normocephalic and atraumatic. Neck:  Supple; no masses or thyromegaly. Lungs:  Clear throughout to auscultation, normal respiratory effort.    Heart:  +S1, +S2, Regular rate and rhythm, No edema. Abdomen:  Soft, nontender and nondistended. Normal bowel sounds, without guarding, and without rebound.   Neurologic:  Alert and  oriented x4;  grossly normal neurologically.   Impression/Plan: Judith Gomez is here for an colonoscopy to be performed for Screening colonoscopy average risk   Risks, benefits, limitations, and alternatives regarding  colonoscopy have been reviewed with the patient.  Questions have been answered.  All parties agreeable.   Jonathon Bellows, MD  01/04/2019, 12:19 PM

## 2019-01-04 NOTE — Anesthesia Post-op Follow-up Note (Signed)
Anesthesia QCDR form completed.        

## 2019-01-07 ENCOUNTER — Encounter: Payer: Self-pay | Admitting: Gastroenterology

## 2019-01-07 LAB — SURGICAL PATHOLOGY

## 2019-01-14 ENCOUNTER — Telehealth: Payer: Self-pay | Admitting: Gastroenterology

## 2019-01-14 NOTE — Telephone Encounter (Signed)
Pt left vm regarding her results from her procedure on 01/04/19 she needs some clarification please call pt

## 2019-01-16 NOTE — Telephone Encounter (Signed)
Discussed with her and explained pathology report. I suggested we change her repeat colonoscopy to 3 years.   Jadijah change recall of colonoscopy in system to 3 years please    C/c Ronnell Freshwater, NP   Dr Jonathon Bellows MD,MRCP [facility]) Gastroenterology/Hepatology Pager: [phone]

## 2019-01-17 ENCOUNTER — Ambulatory Visit
Admission: RE | Admit: 2019-01-17 | Discharge: 2019-01-17 | Disposition: A | Discharge status: Home / Self Care | Payer: BC Managed Care – PPO | Source: Ambulatory Visit | Attending: Nurse Practitioner | Admitting: Nurse Practitioner

## 2019-01-17 ENCOUNTER — Other Ambulatory Visit: Payer: Self-pay

## 2019-01-17 DIAGNOSIS — Z1231 Encounter for screening mammogram for malignant neoplasm of breast: Secondary | ICD-10-CM | POA: Diagnosis present

## 2019-03-13 ENCOUNTER — Telehealth: Payer: Self-pay | Admitting: Obstetrics and Gynecology

## 2019-03-13 NOTE — Telephone Encounter (Signed)
Scheduled patient to come in tomorrow to check for yeast. She is having vaginal itching, burning, and some discharge with odor.

## 2019-03-13 NOTE — Telephone Encounter (Signed)
The patient called and stated that she needs to speak with her nurse in regards to her having a yeast infection and needing something called into her pharmacy. The patient is requesting a call back if possible. Please advise.

## 2019-03-14 ENCOUNTER — Encounter: Payer: Self-pay | Admitting: Obstetrics and Gynecology

## 2019-03-14 ENCOUNTER — Ambulatory Visit: Payer: BC Managed Care – PPO | Admitting: Obstetrics and Gynecology

## 2019-03-14 ENCOUNTER — Other Ambulatory Visit: Payer: Self-pay

## 2019-03-14 VITALS — BP 148/88 | HR 81 | Ht 68.0 in | Wt 159.2 lb

## 2019-03-14 DIAGNOSIS — B373 Candidiasis of vulva and vagina: Secondary | ICD-10-CM | POA: Diagnosis not present

## 2019-03-14 DIAGNOSIS — L292 Pruritus vulvae: Secondary | ICD-10-CM

## 2019-03-14 DIAGNOSIS — B3731 Acute candidiasis of vulva and vagina: Secondary | ICD-10-CM

## 2019-03-14 MED ORDER — FLUCONAZOLE 150 MG PO TABS: 150.0000 mg | ORAL_TABLET | Freq: Once | ORAL | 1 refills | Status: AC

## 2019-03-14 NOTE — Progress Notes (Signed)
HPI:      [name] is a 48 y.o. No obstetric history on file. who LMP was No LMP recorded. (Menstrual status: IUD).  Subjective:   She presents today with complaint of vaginal itching and burning and a small amount of discharge.  She reports that she is not sexually active and has no new sexual partners. She is happy with her IUD.    Hx: The following portions of the patient's history were reviewed and updated as appropriate:             She  has a past medical history of Anemia, Arthritis, Chronic kidney disease, and Heart murmur. She does not have any pertinent problems on file. She  has a past surgical history that includes Cesarean section; Tubal ligation; Colonoscopy; Wisdom tooth extraction; Thumb fusion (Left, 2014); Knee arthroscopy (Left, 11/18/2014); and Colonoscopy with propofol (N/A, 01/04/2019). Her family history is not on file. She  reports that she has never smoked. She has never used smokeless tobacco. She reports that she does not drink alcohol or use drugs. She has a current medication list which includes the following prescription(s): acetaminophen, cholecalciferol, ergocalciferol, fluocinolone acetonide, multivitamin, and fluconazole. She is allergic to tape.       Review of Systems:  Review of Systems  Constitutional: Denied constitutional symptoms, night sweats, recent illness, fatigue, fever, insomnia and weight loss.  Eyes: Denied eye symptoms, eye pain, photophobia, vision change and visual disturbance.  Ears/Nose/Throat/Neck: Denied ear, nose, throat or neck symptoms, hearing loss, nasal discharge, sinus congestion and sore throat.  Cardiovascular: Denied cardiovascular symptoms, arrhythmia, chest pain/pressure, edema, exercise intolerance, orthopnea and palpitations.  Respiratory: Denied pulmonary symptoms, asthma, pleuritic pain, productive sputum, cough, dyspnea and wheezing.  Gastrointestinal: Denied, gastro-esophageal reflux, melena, nausea and  vomiting.  Genitourinary: See HPI for additional information.  Musculoskeletal: Denied musculoskeletal symptoms, stiffness, swelling, muscle weakness and myalgia.  Dermatologic: Denied dermatology symptoms, rash and scar.  Neurologic: Denied neurology symptoms, dizziness, headache, neck pain and syncope.  Psychiatric: Denied psychiatric symptoms, anxiety and depression.  Endocrine: Denied endocrine symptoms including hot flashes and night sweats.   Meds:   Current Outpatient Medications on File Prior to Visit  Medication Sig Dispense Refill  . acetaminophen (TYLENOL) 325 MG tablet Take 1 tablet by mouth.    . cholecalciferol (VITAMIN D) 1000 UNITS tablet Take 1,000 Units by mouth daily.    . ergocalciferol (DRISDOL) 1.25 MG (50000 UT) capsule Take 1 capsule (50,000 Units total) by mouth once a week. 4 capsule 5  . Fluocinolone Acetonide 0.01 % OIL Place 5 drops in ear(s) 2 (two) times daily. 20 mL 0  . Multiple Vitamin (MULTIVITAMIN) capsule Take 1 capsule by mouth daily.     No current facility-administered medications on file prior to visit.     Objective:     Vitals:   03/14/19 1002  BP: (!) 148/88  Pulse: 81              Physical examination   Pelvic:   Vulva: Normal appearance.  No lesions.  Vagina: No lesions or abnormalities noted.  Support: Normal pelvic support.  Urethra No masses tenderness or scarring.  Meatus Normal size without lesions or prolapse.  Cervix: Normal appearance.  No lesions.  IUD strings noted at cervical os  Anus: Normal exam.  No lesions.  Perineum: Normal exam.  No lesions.        Bimanual   Uterus: Normal size.  Non-tender.  Mobile.  AV.  Adnexae: No masses.  Non-tender to palpation.  Cul-de-sac: Negative for abnormality.   WET PREP: clue cells: absent, KOH (yeast): positive, odor: absent and trichomoniasis: negative Ph:  < 4.5   Assessment:    No obstetric history on file. Patient Active Problem List   Diagnosis Date Noted  .  Acute otitis media 11/19/2018  . Screening for colon cancer 08/14/2018  . Dysuria 08/14/2018  . Vitamin D deficiency 08/14/2018  . Excessive bleeding in premenopausal period 07/04/2018  . Uterine leiomyoma 07/04/2018  . Urinary tract infection with hematuria 04/05/2018  . Acute non-recurrent sinusitis 04/05/2018  . Acute low back pain 04/05/2018  . Chest pain 01/17/2018  . Generalized anxiety disorder 01/17/2018  . Acute otitis externa of both ears 01/17/2018  . Subacromial bursitis of right shoulder joint 12/15/2017  . Pain in right hand 09/18/2017  . 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 Melanoma ([place]) 01/10/2017  . History of anemia 01/17/2014  . Morton's neuroma 01/17/2014  . Multiple renal cysts 01/17/2014  . Psoriatic arthritis ([place]) 01/17/2014     1. Monilial vulvovaginitis   2. Vulvar itching        Plan:            1.  Diflucan prescription. Orders No orders of the defined types were placed in this encounter.    Meds ordered this encounter  Medications  . fluconazole (DIFLUCAN) 150 MG tablet    Sig: Take 1 tablet (150 mg total) by mouth once for 1 dose. Additional dose three days later if symptoms persist    Dispense:  1 tablet    Refill:  1      F/U  Return for Annual Physical, Pt to contact us if symptoms worsen. I spent 16 minutes involved in the care of this patient of which greater than 50% was spent discussing vaginal discharge, monilia, future actions to help prevent reinfection.  All questions answered.  Finis Bud, M.D. 03/14/2019 10:30 AM

## 2019-03-14 NOTE — Progress Notes (Signed)
Patient comes in today for vaginal itching, burning, discharge, and odor.

## 2019-03-25 ENCOUNTER — Other Ambulatory Visit: Payer: Self-pay

## 2019-03-25 DIAGNOSIS — E559 Vitamin D deficiency, unspecified: Secondary | ICD-10-CM

## 2019-03-25 MED ORDER — ERGOCALCIFEROL 1.25 MG (50000 UT) PO CAPS: 50000.0000 [IU] | ORAL_CAPSULE | ORAL | 5 refills | Status: DC

## 2019-04-03 ENCOUNTER — Telehealth: Payer: Self-pay | Admitting: Obstetrics and Gynecology

## 2019-04-03 NOTE — Telephone Encounter (Signed)
The patient called and stated that she is still having symptoms and the yeast infection medication did not take care of the patients yeast infection. The patient is requesting to speak with Dr. Amalia Hailey nurse in regards to the issue/concern. Please advise.

## 2019-04-03 NOTE — Telephone Encounter (Signed)
Please advise. Patient was seen on 03/14/2019. Do yo want her to be seen again?

## 2019-04-10 NOTE — Telephone Encounter (Signed)
LM for patient to return call.

## 2019-04-11 MED ORDER — TERCONAZOLE 0.4 % VA CREA: 1.0000 | TOPICAL_CREAM | Freq: Every day | VAGINAL | 0 refills | Status: DC

## 2019-04-11 NOTE — Telephone Encounter (Signed)
I have responded to patients my chart message. Please see.

## 2019-04-17 ENCOUNTER — Other Ambulatory Visit: Payer: Self-pay

## 2019-04-17 DIAGNOSIS — Z20822 Contact with and (suspected) exposure to covid-19: Secondary | ICD-10-CM

## 2019-04-17 NOTE — Progress Notes (Unsigned)
lab

## 2019-04-19 LAB — NOVEL CORONAVIRUS, NAA: SARS-CoV-2, NAA: NOT DETECTED

## 2019-05-01 ENCOUNTER — Ambulatory Visit: Payer: BC Managed Care – PPO | Admitting: Adult Health

## 2019-05-01 ENCOUNTER — Encounter: Payer: Self-pay | Admitting: Adult Health

## 2019-05-01 ENCOUNTER — Other Ambulatory Visit: Payer: Self-pay

## 2019-05-01 DIAGNOSIS — Z20828 Contact with and (suspected) exposure to other viral communicable diseases: Secondary | ICD-10-CM | POA: Diagnosis not present

## 2019-05-01 DIAGNOSIS — N3000 Acute cystitis without hematuria: Secondary | ICD-10-CM | POA: Diagnosis not present

## 2019-05-01 DIAGNOSIS — Z20822 Contact with and (suspected) exposure to covid-19: Secondary | ICD-10-CM

## 2019-05-01 MED ORDER — SULFAMETHOXAZOLE-TRIMETHOPRIM 400-80 MG PO TABS: 1.0000 | ORAL_TABLET | Freq: Two times a day (BID) | ORAL | 0 refills | Status: DC

## 2019-05-01 NOTE — Unmapped (Signed)
Called pt and VM was let informing them of our no visitor policy. Detailed exceptions which include the understanding of them being able to bring one (1) person with them to the appointment during the following circumstances : If physical assistance is required, or if patients have children that do not have another means of child care, or if child patients require a parent/guardian be present, or if patient is an adult requiring a legal guardian be present.      Also, stated that masks and screenings are required for everyone before entry and if they do not have a mask, (cloth are acceptable) one will be provided to them.    Number left for call back.

## 2019-05-01 NOTE — Progress Notes (Signed)
[facility] [place], [place] [address]  Internal MEDICINE  Telephone Visit  Patient Name: Judith Gomez  [id number]  [id number]  Date of Service: 05/01/2019  I connected with the patient at 1008 by telephone and verified the patients identity using two identifiers.   I discussed the limitations, risks, security and privacy concerns of performing an evaluation and management service by telephone and the availability of in person appointments. I also discussed with the patient that there may be a patient responsible charge related to the service.  The patient expressed understanding and agrees to proceed.    Chief Complaint  Patient presents with  . Telephone Assessment    pt cannot come in  due to exposure of covid   . Telephone Screen  . Urinary Tract Infection    HPI  Pt is seen via telephone for urinary frequency, hesitancy and dysuria for about 2 days.  She denies fever, or flank pain.  She reports she has been drinking a lot of soda, and now has switched back to water at this time.  She is in quarantine for covid exposure.  Her daughters coworkers have tested positive, and she was exposed to her daughter.  She nor her husband have symptoms at this time and are waiting for their results. She is unable to come to office for urine dip because of this.       Current Medication: Outpatient Encounter Medications as of 05/01/2019  Medication Sig  . acetaminophen (TYLENOL) 325 MG tablet Take 1 tablet by mouth.  . cholecalciferol (VITAMIN D) 1000 UNITS tablet Take 1,000 Units by mouth daily.  . ergocalciferol (DRISDOL) 1.25 MG (50000 UT) capsule Take 1 capsule (50,000 Units total) by mouth once a week.  . Fluocinolone Acetonide 0.01 % OIL Place 5 drops in ear(s) 2 (two) times daily.  . Multiple Vitamin (MULTIVITAMIN) capsule Take 1 capsule by mouth daily.  Marland Kitchen terconazole (TERAZOL 7) 0.4 % vaginal cream Place 1 applicator vaginally at bedtime.  .  sulfamethoxazole-trimethoprim (BACTRIM) 400-80 MG tablet Take 1 tablet by mouth 2 (two) times daily.   No facility-administered encounter medications on file as of 05/01/2019.     Surgical History: Past Surgical History:  Procedure Laterality Date  . CESAREAN SECTION    . COLONOSCOPY    . COLONOSCOPY WITH PROPOFOL N/A 01/04/2019   Procedure: COLONOSCOPY WITH PROPOFOL;  Surgeon: Jonathon Bellows, MD;  Location: [facility] ENDOSCOPY;  Service: Gastroenterology;  Laterality: N/A;  . KNEE ARTHROSCOPY Left 11/18/2014   Procedure: ARTHROSCOPY KNEE;  Surgeon: Hessie Knows, MD;  Location: [facility] ORS;  Service: Orthopedics;  Laterality: Left;   partial medial menisectomy  . THUMB FUSION Left 2014  . TUBAL LIGATION    . WISDOM TOOTH EXTRACTION      Medical History: Past Medical History:  Diagnosis Date  . Anemia   . Arthritis   . Chronic kidney disease   . Heart murmur     Family History: Family History  Problem Relation Age of Onset  . Breast cancer Neg Hx     Social History   Socioeconomic History  . Marital status: Married    Spouse name: Not on file  . Number of children: Not on file  . Years of education: Not on file  . Highest education level: Not on file  Occupational History  . Not on file  Social Needs  . Financial resource strain: Not on file  . Food insecurity    Worry: Not on file  Inability: Not on file  . Transportation needs    Medical: Not on file    Non-medical: Not on file  Tobacco Use  . Smoking status: Never Smoker  . Smokeless tobacco: Never Used  Substance and Sexual Activity  . Alcohol use: No  . Drug use: No  . Sexual activity: Not on file  Lifestyle  . Physical activity    Days per week: Not on file    Minutes per session: Not on file  . Stress: Not on file  Relationships  . Social Herbalist on phone: Not on file    Gets together: Not on file    Attends religious service: Not on file    Active member of club or organization: Not on file     Attends meetings of clubs or organizations: Not on file    Relationship status: Not on file  . Intimate partner violence    Fear of current or ex partner: Not on file    Emotionally abused: Not on file    Physically abused: Not on file    Forced sexual activity: Not on file  Other Topics Concern  . Not on file  Social History Narrative  . Not on file      Review of Systems  Constitutional: Negative for chills, fatigue and unexpected weight change.  HENT: Negative for congestion, rhinorrhea, sneezing and sore throat.   Eyes: Negative for photophobia, pain and redness.  Respiratory: Negative for cough, chest tightness and shortness of breath.   Cardiovascular: Negative for chest pain and palpitations.  Gastrointestinal: Negative for abdominal pain, constipation, diarrhea, nausea and vomiting.  Endocrine: Negative.   Genitourinary: Positive for difficulty urinating and frequency. Negative for dysuria and hematuria.  Musculoskeletal: Negative for arthralgias, back pain, joint swelling and neck pain.  Skin: Negative for rash.  Allergic/Immunologic: Negative.   Neurological: Negative for tremors and numbness.  Hematological: Negative for adenopathy. Does not bruise/bleed easily.  Psychiatric/Behavioral: Negative for behavioral problems and sleep disturbance. The patient is not nervous/anxious.     Vital Signs: There were no vitals taken for this visit.   Observation/Objective:  Well sounding, NAD noted.    Assessment/Plan: 1. Acute cystitis without hematuria Advised patient to take entire course of antibiotics as prescribed with food. Pt should return to clinic in 7-10 days if symptoms fail to improve or new symptoms develop.  -Bactrim RX   2. Exposure to COVID-19 virus Continue to quarantine as instructed.  Follow up for any issues.   General Counseling: weldon sohmer understanding of the findings of today's phone visit and agrees with plan of treatment. I have  discussed any further diagnostic evaluation that may be needed or ordered today. We also reviewed her medications today. she has been encouraged to call the office with any questions or concerns that should arise related to todays visit.    No orders of the defined types were placed in this encounter.   Meds ordered this encounter  Medications  . sulfamethoxazole-trimethoprim (BACTRIM) 400-80 MG tablet    Sig: Take 1 tablet by mouth 2 (two) times daily.    Dispense:  14 tablet    Refill:  0    Time spent: [place] AGNP-C Internal medicine

## 2019-05-02 ENCOUNTER — Ambulatory Visit: Admit: 2019-05-02 | Discharge: 2019-05-02 | Payer: PRIVATE HEALTH INSURANCE

## 2019-05-02 ENCOUNTER — Ambulatory Visit: Payer: BC Managed Care – PPO | Admitting: Adult Health

## 2019-05-02 DIAGNOSIS — Z01419 Encounter for gynecological examination (general) (routine) without abnormal findings: Secondary | ICD-10-CM

## 2019-05-02 NOTE — Unmapped (Signed)
Subjective:       Julie Molina is a 48 y.o. female here for a routine exam.  Current complaints: menses changing.  Personal health questionnaire reviewed: yes.  Menses regular, q59mo,  Normal flow pattern, some cramps, lot of breast tenderness recently premenstrual.  No dyspareunia.  No bladder issues.  Gets calcium an vitamin D.     Gynecologic History  Patient's last menstrual period was 04/19/2019 (exact date).  Contraception: vasectomy  Last Pap: 2019. Results were: normal  Last mammogram: 2020. Results were: normal  Gets ca  Obstetric History  OB History   Gravida Para Term Preterm AB Living   6 3 3   3 3    SAB TAB Ectopic Multiple Live Births   3       3      # Outcome Date GA Lbr Len/2nd Weight Sex Delivery Anes PTL Lv   6 SAB            5 SAB            4 SAB            3 Term         LIV   2 Term         LIV   1 Term         LIV       The following portions of the patient's history were reviewed and updated as appropriate: allergies, current medications, past family history, past medical history, past social history, past surgical history and problem list.    Review of Systems  A comprehensive review of systems was negative.      Objective:      BP 130/88    Pulse 62    Ht 5' 2 (1.575 m)    Wt 156 lb (70.8 kg)    LMP 04/19/2019 (Exact Date)    SpO2 99%    BMI 28.53 kg/m??   General appearance: alert, appears stated age and cooperative  Head: Normocephalic, without obvious abnormality, atraumatic  Eyes: negative findings: lids and lashes normal, conjunctivae and sclerae normal and corneas clear  Neck: no adenopathy, no JVD, supple, symmetrical, trachea midline and thyroid not enlarged, symmetric, no tenderness/mass/nodules  Back: symmetric, no curvature. ROM normal. No CVA tenderness.  Lungs: clear to auscultation bilaterally and no respiratory distress  Breasts: normal appearance, no masses or tenderness  Heart: irregularly irregular rhythm and S1, S2 normal  Abdomen: soft, non-tender; bowel sounds normal;  no masses,  no organomegaly  Pelvic: cervix normal in appearance, external genitalia normal, no adnexal masses or tenderness, no bladder tenderness, no cervical motion tenderness, rectovaginal septum normal, urethra without abnormality or discharge, uterus normal size, shape, and consistency, vagina normal without discharge and uterus anteverted and well supported.  Very mild rectocele noted  Extremities: extremities normal, atraumatic, no cyanosis or edema and varicose veins noted  Pulses: 2+ and symmetric  Skin: Skin color, texture, turgor normal. No rashes or lesions  Lymph nodes: Cervical, supraclavicular, and axillary nodes normal.  Neurologic: Grossly normal      Assessment:      Healthy female exam.       Plan:      Education reviewed: calcium supplements, self breast exams, skin cancer screening and weight bearing exercise.  Contraception: vasectomy.  Mammogram ordered.  Follow up in: 1 year.  Mammogram q110yr

## 2019-05-14 ENCOUNTER — Other Ambulatory Visit: Payer: Self-pay

## 2019-05-14 DIAGNOSIS — Z20822 Contact with and (suspected) exposure to covid-19: Secondary | ICD-10-CM

## 2019-05-15 ENCOUNTER — Ambulatory Visit: Payer: BC Managed Care – PPO | Admitting: Podiatry

## 2019-05-15 ENCOUNTER — Ambulatory Visit: Payer: BC Managed Care – PPO

## 2019-05-15 ENCOUNTER — Encounter: Payer: Self-pay | Admitting: Podiatry

## 2019-05-15 ENCOUNTER — Ambulatory Visit (INDEPENDENT_AMBULATORY_CARE_PROVIDER_SITE_OTHER): Payer: BC Managed Care – PPO

## 2019-05-15 ENCOUNTER — Other Ambulatory Visit: Payer: Self-pay

## 2019-05-15 VITALS — BP 137/78 | HR 73 | Resp 16

## 2019-05-15 DIAGNOSIS — M2012 Hallux valgus (acquired), left foot: Secondary | ICD-10-CM

## 2019-05-15 DIAGNOSIS — M2021 Hallux rigidus, right foot: Secondary | ICD-10-CM

## 2019-05-15 DIAGNOSIS — M2011 Hallux valgus (acquired), right foot: Secondary | ICD-10-CM

## 2019-05-15 DIAGNOSIS — M79671 Pain in right foot: Secondary | ICD-10-CM

## 2019-05-15 NOTE — Patient Instructions (Signed)
Hallux Rigidus  Hallux rigidus is a type of joint pain or joint disease (arthritis) that affects your big toe (hallux). This condition involves the joint that connects the base of your big toe to the main part of your foot (metatarsophalangeal joint or MTP joint). This condition can cause your big toe to become stiff, painful, and difficult to move. Symptoms may get worse with movement or in cold or damp weather. The condition gets worse over time. What are the causes? This condition may be caused by having a foot that does not function the way that it should or that has an abnormal shape (structural deformity). These foot problems can run in families and may be passed down from parents to children (are hereditary). This condition can also be caused by:  Injury.  Overuse.  Certain inflammatory diseases, including gout and rheumatoid arthritis. What increases the risk? You are more likely to develop this condition if you have:  A foot bone (metatarsal) that is longer or higher than normal.  A family history of hallux rigidus.  Previously injured your big toe.  Feet that do not have a curve (arch) on the inner side of the foot. This may be called flat feet or fallen arches.  Ankles that turn in when you walk (pronation).  Rheumatoid arthritis or gout.  A job that requires you to stoop down often at work. What are the signs or symptoms? Symptoms of this condition include:  Big toe pain.  Stiffness and difficulty moving the big toe.  Swelling of the toe and surrounding area.  Bone spurs. These are bony growths that can form on the joint of the big toe.  A limp. How is this diagnosed? This condition is diagnosed based on your medical history and a physical exam. You may also have X-rays. How is this treated? This condition is treated by:  Wearing roomy, comfortable shoes that have a large toe box.  Putting orthotic devices in your shoes.  Taking pain medicines.  Having  physical therapy.  Icing the injured area.  Alternating between putting your foot in cold water and then in warm water. If your condition is severe, treatment may include:  Corticosteroid injections to relieve pain.  Surgery to remove bone spurs, fuse damaged bones together, or replace the entire joint. Follow these instructions at home: Managing pain, stiffness, and swelling   Put your feet in cold water for 30 seconds, and then in warm water for 30 seconds. Alternate between the cold and warm water for 5 minutes. Do this several times a day or as told by your health care provider.  If directed, put ice on the injured area. ? Put ice in a plastic bag. ? Place a towel between your skin and the bag. ? Leave the ice on for 20 minutes, 2-3 times a day. General instructions  Take over-the-counter and prescription medicines only as told by your health care provider.  Do not wear high heels or other restrictive footwear. Wear comfortable, supportive shoes that have a large toe box.  Wear shoe inserts (orthotics) as told by your health care provider, if this applies.  Do foot exercises as instructed by your health care provider or a physical therapist.  Keep all follow-up visits as told by your health care provider. This is important. Contact a health care provider if:  You notice bone spurs or growths on or around your big toe.  Your pain does not get better or it gets worse.  You have  pain while resting.  You have pain in other parts of your body, such as your back, hip, or knee.  You start to limp. Summary  Hallux rigidus is a condition that makes your big toe become stiff, painful, and difficult to move.  It can be caused by injury, overuse, or inflammatory diseases.  This condition may be treated with ice, medicines, physical therapy, and surgery.  Do not wear high heels or other restrictive footwear. Wear comfortable, supportive shoes that have a large toe box. This  information is not intended to replace advice given to you by your health care provider. Make sure you discuss any questions you have with your health care provider. Document Released: 05/30/2005 Document Revised: 03/09/2018 Document Reviewed: 03/12/2018 Elsevier Patient Education  2020 [employer].

## 2019-05-16 LAB — NOVEL CORONAVIRUS, NAA: SARS-CoV-2, NAA: NOT DETECTED

## 2019-05-16 NOTE — Progress Notes (Signed)
Subjective:   Patient ID: Judith Gomez, female   DOB: 48 y.o.   MRN: [id number]   HPI Patient states that she has had chronic pain in her big toe joint of her right and she was recommended to have surgery by another physician but she wants me to look at this and consider surgery.  States he gets very sore it is been going on for around a year and a half and is gradually gotten worse over that time.  Patient does not smoke likes to be active   ROS      Objective:  Physical Exam Vitals signs and nursing note reviewed.  Constitutional:      Appearance: She is well-developed.  Pulmonary:     Effort: Pulmonary effort is normal.  Musculoskeletal: Normal range of motion.  Skin:    General: Skin is warm.  Neurological:     Mental Status: She is alert.     Neurovascular status intact muscle strength was found to be adequate range of motion within normal limits.  Patient's first MPJ motion right is reduced with pain and what appears to be prominence of the first metatarsal head dorsal medial aspect.  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  I have recommended a biplanar osteotomy to both lower and shorten the first metatarsal and remove bone spur and explained procedure risk to patient and patient wants surgery.  Patient will do this in January and I made her aware of the postoperative course associated with this and she is willing to accept risk wants to have the procedure done and is scheduled for mid January.  We will see back beginning in January to discuss greater detail the procedure that will be necessary  X-ray indicates there is bone spur formation dorsal first metatarsal head right with elongated elevated first metatarsal segment  right over left.  Also discussed long-term orthotics for this patient

## 2019-05-27 ENCOUNTER — Telehealth: Payer: Self-pay | Admitting: Podiatry

## 2019-05-27 NOTE — Telephone Encounter (Signed)
Pt called to change a few of her postop appointments I had scheduled due to conflicts. I got them scheduled for a convenient time for the pt. Told her to call me with any questions she may have before coming in for her sx consult appointment on 06/17/2019.

## 2019-06-13 ENCOUNTER — Telehealth: Payer: Self-pay | Admitting: Podiatry

## 2019-06-13 NOTE — Telephone Encounter (Signed)
DOS: 06/25/2019  SURGICAL PROCEDURE: [name] [employer] [id number])  BCBS Policy Effective : 123XX123  -  06/12/2020   Member Liability Summary       In-Network   Max Per Benefit Period Year-to-Date Remaining     CoInsurance 20%      Deductible $1250.00 $1250.00     Out-Of-Pocket $4890.00 $4890.00  [facility] SURGERY      In Network Copay Coinsurance Not Applicable [id number]  per  [place]

## 2019-06-17 ENCOUNTER — Encounter: Payer: Self-pay | Admitting: Podiatry

## 2019-06-17 ENCOUNTER — Other Ambulatory Visit: Payer: Self-pay

## 2019-06-17 ENCOUNTER — Ambulatory Visit (INDEPENDENT_AMBULATORY_CARE_PROVIDER_SITE_OTHER): Payer: BC Managed Care – PPO | Admitting: Podiatry

## 2019-06-17 ENCOUNTER — Telehealth: Payer: Self-pay | Admitting: Podiatry

## 2019-06-17 DIAGNOSIS — M2021 Hallux rigidus, right foot: Secondary | ICD-10-CM | POA: Diagnosis not present

## 2019-06-17 NOTE — Progress Notes (Signed)
Subjective:   Patient ID: Judith Gomez, female   DOB: 49 y.o.   MRN: [id number]   HPI Patient presents stating she is ready to get this right foot fixed and is here for consult   ROS      Objective:  Physical Exam  Neurovascular status intact with patient found to have limitation of motion right big toe joint with spur formation and compression of the joint surface     Assessment:  Structural hallux limitus deformity right with possibility for mild arthritic changes     Plan:  H&P discussed conditions and discussed surgical intervention with removal of spur and shortening plantarflexed ray osteotomy.  Patient wants surgery understanding all complications and at this point allowed her to read consent form going over alternative treatments also.  She understands total recovery can take upwards of 6 months and there is no long-term guarantees that she will not require fusion or joint implantation at 1 point in future.  Patient wants surgery signed consent form and is dispensed air fracture walker with all instructions on usage

## 2019-06-17 NOTE — Patient Instructions (Signed)
Pre-Operative Instructions  Congratulations, you have decided to take an important step towards improving your quality of life.  You can be assured that the doctors and staff at [facility] will be with you every step of the way.  Here are some important things you should know:  1. Plan to be at the surgery center/hospital at least 1 (one) hour prior to your scheduled time, unless otherwise directed by the surgical center/hospital staff.  You must have a responsible adult accompany you, remain during the surgery and drive you home.  Make sure you have directions to the surgical center/hospital to ensure you arrive on time. 2. If you are having surgery at [facility] or [facility], you will need a copy of your medical history and physical form from your family physician within one month prior to the date of surgery. We will give you a form for your primary physician to complete.  3. We make every effort to accommodate the date you request for surgery.  However, there are times where surgery dates or times have to be moved.  We will contact you as soon as possible if a change in schedule is required.   4. No aspirin/ibuprofen for one week before surgery.  If you are on aspirin, any non-steroidal anti-inflammatory medications (Mobic, Aleve, Ibuprofen) should not be taken seven (7) days prior to your surgery.  You make take Tylenol for pain prior to surgery.  5. Medications - If you are taking daily heart and blood pressure medications, seizure, reflux, allergy, asthma, anxiety, pain or diabetes medications, make sure you notify the surgery center/hospital before the day of surgery so they can tell you which medications you should take or avoid the day of surgery. 6. No food or drink after midnight the night before surgery unless directed otherwise by surgical center/hospital staff. 7. No alcoholic beverages 24-hours prior to surgery.  No smoking 24-hours prior or 24-hours after  surgery. 8. Wear loose pants or shorts. They should be loose enough to fit over bandages, boots, and casts. 9. Don't wear slip-on shoes. Sneakers are preferred. 10. Bring your boot with you to the surgery center/hospital.  Also bring crutches or a walker if your physician has prescribed it for you.  If you do not have this equipment, it will be provided for you after surgery. 11. If you have not been contacted by the surgery center/hospital by the day before your surgery, call to confirm the date and time of your surgery. 12. Leave-time from work may vary depending on the type of surgery you have.  Appropriate arrangements should be made prior to surgery with your employer. 13. Prescriptions will be provided immediately following surgery by your doctor.  Fill these as soon as possible after surgery and take the medication as directed. Pain medications will not be refilled on weekends and must be approved by the doctor. 14. Remove nail polish on the operative foot and avoid getting pedicures prior to surgery. 15. Wash the night before surgery.  The night before surgery wash the foot and leg well with water and the antibacterial soap provided. Be sure to pay special attention to beneath the toenails and in between the toes.  Wash for at least three (3) minutes. Rinse thoroughly with water and dry well with a towel.  Perform this wash unless told not to do so by your physician.  Enclosed: 1 Ice pack (please put in freezer the night before surgery)   1 Hibiclens skin cleaner     Pre-op instructions  If you have any questions regarding the instructions, please do not hesitate to call our office.  [place]: [address], [place], [place] [address] -- [phone]  [place]: [address] [place],  [address] -- [phone]  [place]: [address], [place],  [address] -- [phone]   Website: [email or web address] 

## 2019-06-17 NOTE — Telephone Encounter (Signed)
I was just there for my pre op appointment and I don't see anything in the paperwork about the time of my surgery. Told pt someone from the surgery center would call this Friday or next Monday to let her know what time to arrive. Told her it depends on if they have any changes to their schedules or any pt's that are diabetic or pediatric that need to go back first.

## 2019-06-24 MED ORDER — OXYCODONE-ACETAMINOPHEN 10-325 MG PO TABS: 1.0000 | ORAL_TABLET | ORAL | 0 refills | Status: DC | PRN

## 2019-06-24 MED ORDER — ONDANSETRON HCL 4 MG PO TABS: 4.0000 mg | ORAL_TABLET | Freq: Three times a day (TID) | ORAL | 0 refills | Status: DC | PRN

## 2019-06-24 NOTE — Addendum Note (Signed)
Addended by: Wallene Huh on: 06/24/2019 01:36 PM   Modules accepted: Orders

## 2019-06-25 ENCOUNTER — Encounter: Payer: Self-pay | Admitting: Podiatry

## 2019-06-25 DIAGNOSIS — M2021 Hallux rigidus, right foot: Secondary | ICD-10-CM

## 2019-06-26 ENCOUNTER — Telehealth: Payer: Self-pay | Admitting: *Deleted

## 2019-06-26 NOTE — Telephone Encounter (Signed)
Pt called states she is unsure if she is to be weight bearing with the crutches or non-weight bearing for 3-4 days per Dr. Paulla Dolly. I told pt 3-4 days non-weightbearing with the crutches, but not to have her foot below her heart more than 15 minutes/hour that including walking with the crutches, may put the toe of the boot down for balance and it would put less stress on the top of the foot than holding it up.

## 2019-07-04 ENCOUNTER — Encounter: Payer: Self-pay | Admitting: Podiatry

## 2019-07-04 ENCOUNTER — Ambulatory Visit (INDEPENDENT_AMBULATORY_CARE_PROVIDER_SITE_OTHER): Payer: BC Managed Care – PPO

## 2019-07-04 ENCOUNTER — Ambulatory Visit (INDEPENDENT_AMBULATORY_CARE_PROVIDER_SITE_OTHER): Payer: BC Managed Care – PPO | Admitting: Podiatry

## 2019-07-04 ENCOUNTER — Other Ambulatory Visit: Payer: Self-pay

## 2019-07-04 VITALS — BP 152/94 | HR 90 | Temp 97.9°F | Resp 16

## 2019-07-04 DIAGNOSIS — M2011 Hallux valgus (acquired), right foot: Secondary | ICD-10-CM

## 2019-07-04 NOTE — Progress Notes (Signed)
Subjective:   Patient ID: Judith Gomez, female   DOB: 49 y.o.   MRN: [id number]   HPI Patient states she is doing extremely well and very pleased so far with surgery and looking forward to getting her big toe joint bending   ROS      Objective:  Physical Exam  Neurovascular status intact negative Bevelyn Buckles' sign noted wound edges well healing first metatarsal right with good alignment noted and reasonable motion for this.  F2     Assessment:  Doing well post hallux limitus repair right     Plan:  H&P x-ray reviewed and I discussed range of motion and encouraged motion of the joint and I am referring to physical therapy to get this joint mobilized.  Patient will be seen back for [place] to recheck but so far very pleased with results and continue immobilization elevation and compression  X-rays indicate osteotomy is healing well with fixation in place joint congruence

## 2019-07-04 NOTE — Addendum Note (Signed)
Addended by: Celene Skeen A on: 07/04/2019 05:58 PM   Modules accepted: Orders

## 2019-07-05 ENCOUNTER — Encounter: Payer: BC Managed Care – PPO | Admitting: Podiatry

## 2019-07-19 ENCOUNTER — Encounter: Payer: BC Managed Care – PPO | Admitting: Podiatry

## 2019-08-01 ENCOUNTER — Encounter: Payer: BC Managed Care – PPO | Admitting: Podiatry

## 2019-08-02 ENCOUNTER — Ambulatory Visit (INDEPENDENT_AMBULATORY_CARE_PROVIDER_SITE_OTHER): Payer: BC Managed Care – PPO | Admitting: Podiatry

## 2019-08-02 ENCOUNTER — Other Ambulatory Visit: Payer: Self-pay

## 2019-08-02 ENCOUNTER — Encounter: Payer: Self-pay | Admitting: Podiatry

## 2019-08-02 ENCOUNTER — Encounter: Payer: BC Managed Care – PPO | Admitting: Podiatry

## 2019-08-02 DIAGNOSIS — M2021 Hallux rigidus, right foot: Secondary | ICD-10-CM

## 2019-08-02 DIAGNOSIS — M2011 Hallux valgus (acquired), right foot: Secondary | ICD-10-CM

## 2019-08-02 DIAGNOSIS — M2012 Hallux valgus (acquired), left foot: Secondary | ICD-10-CM

## 2019-08-02 NOTE — Progress Notes (Signed)
  Subjective:  Patient ID: Judith Gomez, female    DOB: 29-Sep-1970,  MRN: [id number]  Chief Complaint  Patient presents with  . Routine Post Op    pov#2 dos 01.12.2021 [facility] pt has minor swelling, pt also states that the left foot is elevated when applying pressure.     49 y.o. female returns for post-op check.  Patient is doing really well.  She has acute/moderate complaints of swelling and some shooting and tingling/nerve related pain to the surgical site.  She has been ambulating with surgical shoes without any issues.  There is a mild antalgic gait due to the unevenness of the shoe and the regular shoes that is causing her some back pain.  Review of Systems: Negative except as noted in the HPI. Denies N/V/F/Ch.  Past Medical History:  Diagnosis Date  . Anemia   . Arthritis   . Chronic kidney disease   . Heart murmur     Current Outpatient Medications:  .  acetaminophen (TYLENOL) 325 MG tablet, Take 1 tablet by mouth., Disp: , Rfl:  .  cholecalciferol (VITAMIN D) 1000 UNITS tablet, Take 1,000 Units by mouth daily., Disp: , Rfl:  .  ergocalciferol (DRISDOL) 1.25 MG (50000 UT) capsule, Take 1 capsule (50,000 Units total) by mouth once a week., Disp: 4 capsule, Rfl: 5 .  Multiple Vitamin (MULTIVITAMIN) capsule, Take 1 capsule by mouth daily., Disp: , Rfl:  .  ondansetron (ZOFRAN) 4 MG tablet, Take 1 tablet (4 mg total) by mouth every 8 (eight) hours as needed for nausea or vomiting., Disp: 20 tablet, Rfl: 0  Social History   Tobacco Use  Smoking Status Never Smoker  Smokeless Tobacco Never Used    Allergies  Allergen Reactions  . Oxycodone     Pt stated, "Made me feel mean"  . Tape Rash    Surgical tape used for C-section   Objective:  There were no vitals filed for this visit. There is no height or weight on file to calculate BMI. Constitutional Well developed. Well nourished.  Vascular Foot warm and well perfused. Capillary refill normal to  all digits.   Neurologic Normal speech. Oriented to person, place, and time. Epicritic sensation to light touch grossly present bilaterally.  Dermatologic  skin completely reepithelialized without any clinical signs of infection.  Orthopedic:  No tenderness to palpation noted about the surgical site.   Radiographs: None Assessment:   1. Hallux valgus of right foot   2. Hallux rigidus of right foot   3. Valgus deformity of both great toes    Plan:  Patient was evaluated and treated and all questions answered.  S/p foot surgery right -Progressing as expected post-operatively. -XR: None -WB Status: Weightbearing as tolerated in regular sneakers.  Patient will be transition from surgical shoe to sneakers -Sutures: Absorbable sutures were clipped.  No complications noted. -Medications: None  No follow-ups on file.

## 2019-08-05 ENCOUNTER — Encounter: Payer: Self-pay | Admitting: Podiatry

## 2019-08-05 ENCOUNTER — Telehealth: Payer: Self-pay | Admitting: Podiatry

## 2019-08-05 NOTE — Telephone Encounter (Signed)
Pt is requesting a note that she was here for her visit on Friday for her job. Requested I e-mail it to her at the e-mail on file.

## 2019-08-05 NOTE — Telephone Encounter (Signed)
I was there on Friday and forgot to ask for a doctors note. Please call me back or with any questions or you can e-mail me at the e-mail address on file. Thank you.

## 2019-08-14 ENCOUNTER — Telehealth: Payer: Self-pay

## 2019-08-14 NOTE — Telephone Encounter (Signed)
LMOM TO CONFIRM AND SCREEN FOR 08-16-19 OV.

## 2019-08-16 ENCOUNTER — Ambulatory Visit (INDEPENDENT_AMBULATORY_CARE_PROVIDER_SITE_OTHER): Payer: BC Managed Care – PPO | Admitting: Nurse Practitioner

## 2019-08-16 ENCOUNTER — Encounter: Payer: Self-pay | Admitting: Nurse Practitioner

## 2019-08-16 ENCOUNTER — Other Ambulatory Visit: Payer: Self-pay

## 2019-08-16 VITALS — BP 127/84 | HR 72 | Temp 97.6°F | Resp 16 | Ht 68.0 in | Wt 157.8 lb

## 2019-08-16 DIAGNOSIS — E559 Vitamin D deficiency, unspecified: Secondary | ICD-10-CM | POA: Diagnosis not present

## 2019-08-16 DIAGNOSIS — D259 Leiomyoma of uterus, unspecified: Secondary | ICD-10-CM | POA: Diagnosis not present

## 2019-08-16 DIAGNOSIS — M25511 Pain in right shoulder: Secondary | ICD-10-CM | POA: Diagnosis not present

## 2019-08-16 DIAGNOSIS — R300 Dysuria: Secondary | ICD-10-CM

## 2019-08-16 DIAGNOSIS — Z0001 Encounter for general adult medical examination with abnormal findings: Secondary | ICD-10-CM | POA: Diagnosis not present

## 2019-08-16 DIAGNOSIS — G8929 Other chronic pain: Secondary | ICD-10-CM

## 2019-08-16 NOTE — Progress Notes (Signed)
[facility] [place],  [id number]  Internal MEDICINE  Office Visit Note  Patient Name: Judith Gomez  [id number]  [id number]  Date of Service: 08/19/2019   Pt is here for routine health maintenance examination   Chief Complaint  Patient presents with  . Annual Exam  . Heartburn    bad heart burn,gas   . Migraine     The patient is here for health maintenance exam. The patient does report increase gas with burping and flatulence. Sometimes this causes abdominal pain. She states that antacids did not work. Does not take anything routinely for acid reflux. Does not take pre/probiotics. She has had foot surgery to repair hallux valgus of the right foot. She is still going to physical therapy for this and pain is gradually improving. She has also had placement of IUD due to severely heavy periods which were more often then normal. She states that this has really helped. She has follow up with GYN in October.   Current Medication: Outpatient Encounter Medications as of 08/16/2019  Medication Sig  . acetaminophen (TYLENOL) 325 MG tablet Take 1 tablet by mouth.  . cholecalciferol (VITAMIN D) 1000 UNITS tablet Take 1,000 Units by mouth daily.  . ergocalciferol (DRISDOL) 1.25 MG (50000 UT) capsule Take 1 capsule (50,000 Units total) by mouth once a week.  . Multiple Vitamin (MULTIVITAMIN) capsule Take 1 capsule by mouth daily.  . ondansetron (ZOFRAN) 4 MG tablet Take 1 tablet (4 mg total) by mouth every 8 (eight) hours as needed for nausea or vomiting. (Patient not taking: Reported on 08/16/2019)   No facility-administered encounter medications on file as of 08/16/2019.    Surgical History: Past Surgical History:  Procedure Laterality Date  . CESAREAN SECTION    . COLONOSCOPY    . COLONOSCOPY WITH PROPOFOL N/A 01/04/2019   Procedure: COLONOSCOPY WITH PROPOFOL;  Surgeon: Jonathon Bellows, MD;  Location: [facility] ENDOSCOPY;  Service: Gastroenterology;  Laterality: N/A;   . KNEE ARTHROSCOPY Left 11/18/2014   Procedure: ARTHROSCOPY KNEE;  Surgeon: Hessie Knows, MD;  Location: [facility] ORS;  Service: Orthopedics;  Laterality: Left;   partial medial menisectomy  . THUMB FUSION Left 2014  . TUBAL LIGATION    . WISDOM TOOTH EXTRACTION      Medical History: Past Medical History:  Diagnosis Date  . Anemia   . Arthritis   . Chronic kidney disease   . Heart murmur     Family History: Family History  Problem Relation Age of Onset  . Breast cancer Neg Hx       Review of Systems  Constitutional: Negative for activity change, chills, fatigue and unexpected weight change.  HENT: Negative for congestion, postnasal drip, rhinorrhea, sneezing and sore throat.   Respiratory: Negative for cough, chest tightness, shortness of breath and wheezing.   Cardiovascular: Negative for chest pain and palpitations.  Gastrointestinal: Negative for abdominal pain, constipation, diarrhea, nausea and vomiting.  Endocrine: Negative for cold intolerance, heat intolerance, polydipsia and polyuria.  Genitourinary: Negative for dysuria, frequency, menstrual problem and vaginal bleeding.       Had IUD placed last year per GYN provider. States that she is doing very well with this.   Musculoskeletal: Positive for back pain. Negative for arthralgias and joint swelling.  Skin: Negative for rash.  Allergic/Immunologic: Negative for environmental allergies.  Neurological: Negative for dizziness, tremors, numbness and headaches.  Hematological: Negative for adenopathy. Does not bruise/bleed easily.  Psychiatric/Behavioral: Negative for behavioral problems (Depression), sleep disturbance and  suicidal ideas. The patient is nervous/anxious.     Today's Vitals   08/16/19 1001  BP: 127/84  Pulse: 72  Resp: 16  Temp: 97.6 F (36.4 C)  SpO2: 98%  Weight: 157 lb 12.8 oz (71.6 kg)  Height: 5\' 8"  (1.727 m)   Body mass index is 23.99 kg/m.  Physical Exam Vitals and nursing note reviewed.   Constitutional:      General: She is not in acute distress.    Appearance: Normal appearance. She is well-developed. She is not diaphoretic.  HENT:     Head: Normocephalic and atraumatic.     Nose: Nose normal.     Mouth/Throat:     Pharynx: No oropharyngeal exudate.  Eyes:     Pupils: Pupils are equal, round, and reactive to light.  Neck:     Thyroid: No thyromegaly.     Vascular: No carotid bruit or JVD.     Trachea: No tracheal deviation.  Cardiovascular:     Rate and Rhythm: Normal rate and regular rhythm.     Pulses: Normal pulses.     Heart sounds: Normal heart sounds. No murmur. No friction rub. No gallop.   Pulmonary:     Effort: Pulmonary effort is normal. No respiratory distress.     Breath sounds: Normal breath sounds. No wheezing or rales.  Chest:     Chest wall: No tenderness.     Breasts:        Right: Normal. No swelling, bleeding, inverted nipple, mass, nipple discharge, skin change or tenderness.        Left: Normal. No swelling, bleeding, inverted nipple, mass, nipple discharge, skin change or tenderness.  Abdominal:     General: Bowel sounds are normal.     Palpations: Abdomen is soft.  Musculoskeletal:        General: Normal range of motion.     Cervical back: Normal range of motion and neck supple.  Lymphadenopathy:     Cervical: No cervical adenopathy.     Upper Body:     Right upper body: No axillary adenopathy.     Left upper body: No axillary adenopathy.  Skin:    General: Skin is warm and dry.     Capillary Refill: Capillary refill takes less than 2 seconds.  Neurological:     General: No focal deficit present.     Mental Status: She is alert and oriented to person, place, and time.     Cranial Nerves: No cranial nerve deficit.  Psychiatric:        Mood and Affect: Mood normal.        Behavior: Behavior normal.        Thought Content: Thought content normal.        Judgment: Judgment normal.      LABS: Recent Results (from the past  2160 hour(s))  UA/M w/rflx Culture, Routine     Status: Abnormal   Collection Time: 08/16/19 12:00 AM   Specimen: Urine   URINE  Result Value Ref Range   Specific Gravity, UA 1.025 1.005 - 1.030   pH, UA 5.0 5.0 - 7.5   Color, UA Yellow Yellow   Appearance Ur Turbid (A) Clear   Leukocytes,UA Negative Negative   Protein,UA Negative Negative/Trace   Glucose, UA Negative Negative   Ketones, UA Negative Negative   RBC, UA Negative Negative   Bilirubin, UA Negative Negative   Urobilinogen, Ur 0.2 0.2 - 1.0 mg/dL   Nitrite, UA Negative Negative  Microscopic Examination Comment     Comment: Microscopic follows if indicated.   Microscopic Examination See below:     Comment: Microscopic was indicated and was performed.   Urinalysis Reflex Comment     Comment: This specimen will not reflex to a Urine Culture.  Microscopic Examination     Status: None   Collection Time: 08/16/19 12:00 AM   URINE  Result Value Ref Range   WBC, UA None seen 0 - 5 /hpf   RBC None seen 0 - 2 /hpf   Epithelial Cells (non renal) 0-10 0 - 10 /hpf   Casts None seen None seen /lpf   Mucus, UA Present Not Estab.   Bacteria, UA Few None seen/Few    Assessment/Plan:  1. Encounter for general adult medical examination with abnormal findings Annual health maintenance exam today. Will have pelvic exam and pap, if indicated, per GYN In October   2. Uterine leiomyoma, unspecified location Doing much better since IUD placed.   3. Vitamin D deficiency Continue Drisdol weekly  4. Chronic right shoulder pain Continue with ROM exercises. Will x-ray and refer to ortho or physical therapy as indicated.   5. Dysuria - UA/M w/rflx Culture, Routine  General Counseling: nhu skahill understanding of the findings of todays visit and agrees with plan of treatment. I have discussed any further diagnostic evaluation that may be needed or ordered today. We also reviewed her medications today. she has been encouraged to  call the office with any questions or concerns that should arise related to todays visit.    Counseling:  This patient was seen by Leretha Pol FNP Collaboration with Dr Lavera Guise as a part of collaborative care agreement  Orders Placed This Encounter  Procedures  . Microscopic Examination  . UA/M w/rflx Culture, Routine      Total time spent: 69 Minutes  Time spent includes review of chart, medications, test results, and follow up plan with the patient.     Lavera Guise, MD  Internal Medicine

## 2019-08-17 LAB — UA/M W/RFLX CULTURE, ROUTINE
Bilirubin, UA: NEGATIVE
Glucose, UA: NEGATIVE
Ketones, UA: NEGATIVE
Leukocytes,UA: NEGATIVE
Nitrite, UA: NEGATIVE
Protein,UA: NEGATIVE
RBC, UA: NEGATIVE
Specific Gravity, UA: 1.025 (ref 1.005–1.030)
Urobilinogen, Ur: 0.2 mg/dL (ref 0.2–1.0)
pH, UA: 5 (ref 5.0–7.5)

## 2019-08-17 LAB — MICROSCOPIC EXAMINATION
Casts: NONE SEEN /lpf
RBC, Urine: NONE SEEN /hpf (ref 0–2)
WBC, UA: NONE SEEN /hpf (ref 0–5)

## 2019-08-19 DIAGNOSIS — M25511 Pain in right shoulder: Secondary | ICD-10-CM | POA: Insufficient documentation

## 2019-08-19 DIAGNOSIS — Z0001 Encounter for general adult medical examination with abnormal findings: Secondary | ICD-10-CM | POA: Insufficient documentation

## 2019-08-19 DIAGNOSIS — G8929 Other chronic pain: Secondary | ICD-10-CM | POA: Insufficient documentation

## 2019-09-18 ENCOUNTER — Other Ambulatory Visit: Payer: Self-pay

## 2019-09-18 ENCOUNTER — Telehealth: Payer: Self-pay

## 2019-09-18 DIAGNOSIS — Z1152 Encounter for screening for COVID-19: Secondary | ICD-10-CM

## 2019-09-18 DIAGNOSIS — U071 COVID-19: Secondary | ICD-10-CM

## 2019-09-18 NOTE — Telephone Encounter (Signed)
ERROR

## 2019-09-20 LAB — SARS-COV-2 ANTIBODY, IGM: SARS-CoV-2 Antibody, IgM: NEGATIVE

## 2019-09-22 NOTE — Progress Notes (Signed)
Negative COVID test

## 2019-11-01 ENCOUNTER — Other Ambulatory Visit: Payer: Self-pay

## 2019-11-01 DIAGNOSIS — E559 Vitamin D deficiency, unspecified: Secondary | ICD-10-CM

## 2019-11-01 MED ORDER — ERGOCALCIFEROL 1.25 MG (50000 UT) PO CAPS: 50000.0000 [IU] | ORAL_CAPSULE | ORAL | 5 refills | Status: DC

## 2019-11-05 ENCOUNTER — Encounter: Payer: Self-pay | Admitting: Nurse Practitioner

## 2019-11-05 ENCOUNTER — Other Ambulatory Visit: Payer: Self-pay

## 2019-11-05 ENCOUNTER — Ambulatory Visit (INDEPENDENT_AMBULATORY_CARE_PROVIDER_SITE_OTHER): Payer: BC Managed Care – PPO | Admitting: Nurse Practitioner

## 2019-11-05 VITALS — BP 127/75 | HR 64 | Temp 97.5°F | Resp 16 | Ht 68.0 in | Wt 154.0 lb

## 2019-11-05 DIAGNOSIS — M79604 Pain in right leg: Secondary | ICD-10-CM

## 2019-11-05 DIAGNOSIS — K581 Irritable bowel syndrome with constipation: Secondary | ICD-10-CM | POA: Diagnosis not present

## 2019-11-05 DIAGNOSIS — M79605 Pain in left leg: Secondary | ICD-10-CM | POA: Diagnosis not present

## 2019-11-05 DIAGNOSIS — M7120 Synovial cyst of popliteal space [Baker], unspecified knee: Secondary | ICD-10-CM | POA: Insufficient documentation

## 2019-11-05 MED ORDER — TRULANCE 3 MG PO TABS: 3.0000 mg | ORAL_TABLET | Freq: Every day | ORAL | 1 refills | Status: DC

## 2019-11-05 NOTE — Progress Notes (Signed)
[facility] [place], [place] [address]  Internal MEDICINE  Office Visit Note  Patient Name: Judith Gomez  [id number]  [id number]  Date of Service: 11/05/2019  Chief Complaint  Patient presents with  . Knee Pain    back of knees bruising, swelling, played tennis this past weekend.      The patient is here for acute visit. She states that she has noted some swelling, redness, bruising present behind both knees. States that she had three thrombosed hemorrhoids removed on Friday, Nov 01, 2019, and making her a bit nervous that she may have developed DVT since having procedure to remove hemorrhoids. She was fairly physically active the following day. She played tennis and she moved some furniture around. After that, she started noticing the pain behind the knees.  She also has chronic constipation. This is precipitating cause of her thrombosed hemorrhoids. She has tried so many over the counter preparations. She has tried miralax, metamucil,fbercon, stool softeners, and laxatives. She needs to try something to help her bowel movements stay regular so these hemorrhoids do not develop like this again.   Pt is here for a sick visit.     Current Medication:  Outpatient Encounter Medications as of 11/05/2019  Medication Sig  . acetaminophen (TYLENOL) 325 MG tablet Take 1 tablet by mouth.  . ergocalciferol (DRISDOL) 1.25 MG (50000 UT) capsule Take 1 capsule (50,000 Units total) by mouth once a week.  . Multiple Vitamin (MULTIVITAMIN) capsule Take 1 capsule by mouth daily.  Marland Kitchen Plecanatide (TRULANCE) 3 MG TABS Take 3 mg by mouth daily.  . [DISCONTINUED] cholecalciferol (VITAMIN D) 1000 UNITS tablet Take 1,000 Units by mouth daily.  . [DISCONTINUED] ondansetron (ZOFRAN) 4 MG tablet Take 1 tablet (4 mg total) by mouth every 8 (eight) hours as needed for nausea or vomiting. (Patient not taking: Reported on 08/16/2019)   No facility-administered encounter medications on  file as of 11/05/2019.      Medical History: Past Medical History:  Diagnosis Date  . Anemia   . Arthritis   . Chronic kidney disease   . Heart murmur      Today's Vitals   11/05/19 1027  BP: 127/75  Pulse: 64  Resp: 16  Temp: (!) 97.5 F (36.4 C)  SpO2: 100%  Weight: 154 lb (69.9 kg)  Height: 5\' 8"  (1.727 m)   Body mass index is 23.42 kg/m.  Review of Systems  Constitutional: Negative for activity change, chills, fatigue and unexpected weight change.  HENT: Positive for postnasal drip. Negative for congestion, rhinorrhea, sneezing and sore throat.   Respiratory: Negative for cough, chest tightness, shortness of breath and wheezing.   Cardiovascular: Negative for chest pain and palpitations.  Gastrointestinal: Positive for constipation. Negative for abdominal pain, diarrhea, nausea and vomiting.       Had multiple thrombosed hemorrhoids removed on Friday, Nov 01, 2019 due to chronic and severe constipation.   Genitourinary: Negative for dysuria and frequency.  Musculoskeletal: Positive for myalgias. Negative for arthralgias, back pain, joint swelling and neck pain.       Tenderness behind both knees. Swelling and bruising has been noted.   Skin: Negative for rash.  Neurological: Negative for dizziness, tremors, numbness and headaches.  Hematological: Negative for adenopathy. Does not bruise/bleed easily.  Psychiatric/Behavioral: Negative for behavioral problems (Depression), sleep disturbance and suicidal ideas. The patient is nervous/anxious.     Physical Exam Vitals and nursing note reviewed.  Constitutional:      General:  She is not in acute distress.    Appearance: Normal appearance. She is well-developed. She is not diaphoretic.  HENT:     Head: Normocephalic and atraumatic.     Nose: Nose normal.     Mouth/Throat:     Pharynx: No oropharyngeal exudate.  Eyes:     Pupils: Pupils are equal, round, and reactive to light.  Neck:     Thyroid: No thyromegaly.      Vascular: No JVD.     Trachea: No tracheal deviation.  Cardiovascular:     Rate and Rhythm: Normal rate and regular rhythm.     Heart sounds: Normal heart sounds. No murmur. No friction rub. No gallop.   Pulmonary:     Effort: Pulmonary effort is normal. No respiratory distress.     Breath sounds: Normal breath sounds. No wheezing or rales.  Chest:     Chest wall: No tenderness.  Abdominal:     Palpations: Abdomen is soft.  Musculoskeletal:        General: Normal range of motion.     Cervical back: Normal range of motion and neck supple.       Legs:  Lymphadenopathy:     Cervical: No cervical adenopathy.  Skin:    General: Skin is warm and dry.  Neurological:     Mental Status: She is alert and oriented to person, place, and time.     Cranial Nerves: No cranial nerve deficit.     Deep Tendon Reflexes: Reflexes normal.  Psychiatric:        Mood and Affect: Mood normal.        Behavior: Behavior normal.        Thought Content: Thought content normal.        Judgment: Judgment normal.   Assessment/Plan: 1. Pain in both lower extremities Highly suspicious for baker's cysts, less suspicious for DVT. Will ultrasound the lower extremities for further evaluation and treat as indicated.  - US Venous Img Lower Bilateral; Future  2. Baker's cyst of knee, unspecified laterality Highly suspicious for baker's cysts, less suspicious for DVT. Will ultrasound the lower extremities for further evaluation and treat as indicated. - US Venous Img Lower Bilateral; Future  3. Irritable bowel syndrome with constipation Start trulance 3mg  daily. New, 90 day prescription sent to her pharmacy. Manufacturer coupon given to the patient.  - Plecanatide (TRULANCE) 3 MG TABS; Take 3 mg by mouth daily.  Dispense: 90 tablet; Refill: 1  General Counseling: chauntia rico understanding of the findings of todays visit and agrees with plan of treatment. I have discussed any further diagnostic evaluation  that may be needed or ordered today. We also reviewed her medications today. she has been encouraged to call the office with any questions or concerns that should arise related to todays visit.    Counseling:  This patient was seen by Leretha Pol FNP Collaboration with Dr Lavera Guise as a part of collaborative care agreement  Orders Placed This Encounter  Procedures  . US Venous Img Lower Bilateral    Meds ordered this encounter  Medications  . Plecanatide (TRULANCE) 3 MG TABS    Sig: Take 3 mg by mouth daily.    Dispense:  90 tablet    Refill:  1    Patient has manufacturer coupon. She has tried multiple OTC medications and fiber supplements to help, all have failed.    Order Specific Question:   Supervising Provider    Answer:   Clayborn Bigness  M [[id number]]    Time spent: 30 Minutes

## 2019-11-06 ENCOUNTER — Other Ambulatory Visit: Payer: Self-pay | Admitting: Nurse Practitioner

## 2019-11-07 LAB — COMPREHENSIVE METABOLIC PANEL
ALT: 11 IU/L (ref 0–32)
AST: 15 IU/L (ref 0–40)
Albumin/Globulin Ratio: 2.5 — ABNORMAL HIGH (ref 1.2–2.2)
Albumin: 4.7 g/dL (ref 3.8–4.9)
Alkaline Phosphatase: 82 IU/L (ref 48–121)
BUN/Creatinine Ratio: 14 (ref 9–23)
BUN: 11 mg/dL (ref 6–24)
Bilirubin Total: 0.6 mg/dL (ref 0.0–1.2)
CO2: 24 mmol/L (ref 20–29)
Calcium: 9.7 mg/dL (ref 8.7–10.2)
Chloride: 102 mmol/L (ref 96–106)
Creatinine, Ser: 0.76 mg/dL (ref 0.57–1.00)
GFR calc Af Amer: 105 mL/min/{1.73_m2} (ref >59)
GFR calc non Af Amer: 91 mL/min/{1.73_m2} (ref >59)
Globulin, Total: 1.9 g/dL (ref 1.5–4.5)
Glucose: 101 mg/dL — ABNORMAL HIGH (ref 65–99)
Potassium: 3.9 mmol/L (ref 3.5–5.2)
Sodium: 139 mmol/L (ref 134–144)
Total Protein: 6.6 g/dL (ref 6.0–8.5)

## 2019-11-07 LAB — FERRITIN: Ferritin: 64 ng/mL (ref 15–150)

## 2019-11-07 LAB — IRON AND TIBC
Iron Saturation: 32 % (ref 15–55)
Iron: 93 ug/dL (ref 27–159)
Total Iron Binding Capacity: 293 ug/dL (ref 250–450)
UIBC: 200 ug/dL (ref 131–425)

## 2019-11-07 LAB — LIPID PANEL W/O CHOL/HDL RATIO
Cholesterol, Total: 194 mg/dL (ref 100–199)
HDL: 59 mg/dL (ref >39)
LDL Chol Calc (NIH): 125 mg/dL — ABNORMAL HIGH (ref 0–99)
Triglycerides: 53 mg/dL (ref 0–149)
VLDL Cholesterol Cal: 10 mg/dL (ref 5–40)

## 2019-11-07 LAB — CBC
Hematocrit: 37.4 % (ref 34.0–46.6)
Hemoglobin: 12.7 g/dL (ref 11.1–15.9)
MCH: 31.1 pg (ref 26.6–33.0)
MCHC: 34 g/dL (ref 31.5–35.7)
MCV: 91 fL (ref 79–97)
Platelets: 237 10*3/uL (ref 150–450)
RBC: 4.09 x10E6/uL (ref 3.77–5.28)
RDW: 11.9 % (ref 11.7–15.4)
WBC: 3.6 10*3/uL (ref 3.4–10.8)

## 2019-11-07 LAB — B12 AND FOLATE PANEL
Folate: 15.1 ng/mL (ref >3.0)
Vitamin B-12: 307 pg/mL (ref 232–1245)

## 2019-11-07 LAB — T4, FREE: Free T4: 1.12 ng/dL (ref 0.82–1.77)

## 2019-11-07 LAB — TSH: TSH: 2.87 u[IU]/mL (ref 0.450–4.500)

## 2019-11-07 LAB — VITAMIN D 25 HYDROXY (VIT D DEFICIENCY, FRACTURES): Vit D, 25-Hydroxy: 32.4 ng/mL (ref 30.0–100.0)

## 2019-11-08 ENCOUNTER — Other Ambulatory Visit: Payer: Self-pay

## 2019-11-08 ENCOUNTER — Ambulatory Visit (INDEPENDENT_AMBULATORY_CARE_PROVIDER_SITE_OTHER): Payer: BC Managed Care – PPO

## 2019-11-08 DIAGNOSIS — M79605 Pain in left leg: Secondary | ICD-10-CM

## 2019-11-08 DIAGNOSIS — M79604 Pain in right leg: Secondary | ICD-10-CM

## 2019-11-08 DIAGNOSIS — M7120 Synovial cyst of popliteal space [Baker], unspecified knee: Secondary | ICD-10-CM

## 2019-11-08 NOTE — Progress Notes (Signed)
Labs good. Discuss at visit 12/02/2019

## 2019-11-13 NOTE — Progress Notes (Signed)
3.5cm baker's cyst in right popliteal area

## 2019-11-22 ENCOUNTER — Other Ambulatory Visit: Payer: BC Managed Care – PPO

## 2019-11-26 ENCOUNTER — Inpatient Hospital Stay: Admit: 2019-11-26 | Payer: BLUE CROSS/BLUE SHIELD | Primary: Internal Medicine

## 2019-11-26 ENCOUNTER — Encounter

## 2019-11-26 DIAGNOSIS — Z1231 Encounter for screening mammogram for malignant neoplasm of breast: Secondary | ICD-10-CM

## 2019-11-28 ENCOUNTER — Telehealth: Payer: Self-pay

## 2019-11-28 NOTE — Telephone Encounter (Signed)
Lmom to confirm and screen for 12-02-19 ov.

## 2019-12-02 ENCOUNTER — Other Ambulatory Visit: Payer: Self-pay

## 2019-12-02 ENCOUNTER — Ambulatory Visit (INDEPENDENT_AMBULATORY_CARE_PROVIDER_SITE_OTHER): Payer: BC Managed Care – PPO | Admitting: Nurse Practitioner

## 2019-12-02 ENCOUNTER — Encounter: Payer: Self-pay | Admitting: Nurse Practitioner

## 2019-12-02 VITALS — BP 124/77 | HR 65 | Temp 97.6°F | Resp 16 | Ht 68.0 in | Wt 155.6 lb

## 2019-12-02 DIAGNOSIS — K581 Irritable bowel syndrome with constipation: Secondary | ICD-10-CM

## 2019-12-02 DIAGNOSIS — M7121 Synovial cyst of popliteal space [Baker], right knee: Secondary | ICD-10-CM

## 2019-12-02 DIAGNOSIS — I8312 Varicose veins of left lower extremity with inflammation: Secondary | ICD-10-CM | POA: Diagnosis not present

## 2019-12-02 DIAGNOSIS — I8311 Varicose veins of right lower extremity with inflammation: Secondary | ICD-10-CM

## 2019-12-02 MED ORDER — LINACLOTIDE 145 MCG PO CAPS: 145.0000 ug | ORAL_CAPSULE | Freq: Every day | ORAL | 1 refills | Status: DC

## 2019-12-02 NOTE — Progress Notes (Signed)
[facility] [place],  [address]  Internal MEDICINE  Office Visit Note  Patient Name: Judith Gomez  [id number]  [id number]  Date of Service: 12/11/2019  Chief Complaint  Patient presents with  . Anemia  . Follow-up    U/S    Patient is here for follow up visit of ultrasound.  - negative for DVT of SVT -positive for baker's cyst right popliteal fossa. Currently non tender. 3.5 X 0.7cm in diameter -severe varicose veins on both legs, more severe on right side. Has one which is across medial and anterior aspect of the right mid leg. Tender.  -constipation. Prescription for trulance very expensive. linzess is preferred per her insurance       Current Medication: Outpatient Encounter Medications as of 12/02/2019  Medication Sig  . acetaminophen (TYLENOL) 325 MG tablet Take 1 tablet by mouth.  . ergocalciferol (DRISDOL) 1.25 MG (50000 UT) capsule Take 1 capsule (50,000 Units total) by mouth once a week.  . Multiple Vitamin (MULTIVITAMIN) capsule Take 1 capsule by mouth daily.  . [DISCONTINUED] Plecanatide (TRULANCE) 3 MG TABS Take 3 mg by mouth daily.  Marland Kitchen linaclotide (LINZESS) 145 MCG CAPS capsule Take 1 capsule (145 mcg total) by mouth daily before breakfast.   No facility-administered encounter medications on file as of 12/02/2019.    Surgical History: Past Surgical History:  Procedure Laterality Date  . CESAREAN SECTION    . COLONOSCOPY    . COLONOSCOPY WITH PROPOFOL N/A 01/04/2019   Procedure: COLONOSCOPY WITH PROPOFOL;  Surgeon: Jonathon Bellows, MD;  Location: [facility] ENDOSCOPY;  Service: Gastroenterology;  Laterality: N/A;  . KNEE ARTHROSCOPY Left 11/18/2014   Procedure: ARTHROSCOPY KNEE;  Surgeon: Hessie Knows, MD;  Location: [facility] ORS;  Service: Orthopedics;  Laterality: Left;   partial medial menisectomy  . THUMB FUSION Left 2014  . TUBAL LIGATION    . WISDOM TOOTH EXTRACTION      Medical History: Past Medical History:  Diagnosis Date  .  Anemia   . Arthritis   . Chronic kidney disease   . Heart murmur     Family History: Family History  Problem Relation Age of Onset  . Breast cancer Neg Hx     Social History   Socioeconomic History  . Marital status: Married    Spouse name: Not on file  . Number of children: Not on file  . Years of education: Not on file  . Highest education level: Not on file  Occupational History  . Not on file  Tobacco Use  . Smoking status: Never Smoker  . Smokeless tobacco: Never Used  Vaping Use  . Vaping Use: Never used  Substance and Sexual Activity  . Alcohol use: No  . Drug use: No  . Sexual activity: Not on file  Other Topics Concern  . Not on file  Social History Narrative  . Not on file   Social Determinants of Health   Financial Resource Strain:   . Difficulty of Paying Living Expenses:   Food Insecurity:   . Worried About Charity fundraiser in the Last Year:   . Arboriculturist in the Last Year:   Transportation Needs:   . Film/video editor (Medical):   Marland Kitchen Lack of Transportation (Non-Medical):   Physical Activity:   . Days of Exercise per Week:   . Minutes of Exercise per Session:   Stress:   . Feeling of Stress :   Social Connections:   . Frequency  of Communication with Friends and Family:   . Frequency of Social Gatherings with Friends and Family:   . Attends Religious Services:   . Active Member of Clubs or Organizations:   . Attends Archivist Meetings:   Marland Kitchen Marital Status:   Intimate Partner Violence:   . Fear of Current or Ex-Partner:   . Emotionally Abused:   Marland Kitchen Physically Abused:   . Sexually Abused:       Review of Systems  Constitutional: Negative for activity change, chills, fatigue and unexpected weight change.  HENT: Negative for congestion, postnasal drip, rhinorrhea, sneezing and sore throat.   Respiratory: Negative for cough, chest tightness, shortness of breath and wheezing.   Cardiovascular: Negative for chest pain  and palpitations.  Gastrointestinal: Positive for constipation. Negative for abdominal pain, diarrhea, nausea and vomiting.       Prescribed trulance at last visit however, this was very expensive even with manufacturer coupon.   Musculoskeletal: Positive for myalgias. Negative for arthralgias, back pain, joint swelling and neck pain.       Tenderness behind both knees. Swelling and bruising has been noted.   Skin: Negative for rash.  Neurological: Negative for dizziness, tremors, numbness and headaches.  Hematological: Negative for adenopathy. Does not bruise/bleed easily.  Psychiatric/Behavioral: Negative for behavioral problems (Depression), sleep disturbance and suicidal ideas. The patient is nervous/anxious.     Today's Vitals   12/02/19 1615  BP: 124/77  Pulse: 65  Resp: 16  Temp: 97.6 F (36.4 C)  SpO2: 98%  Weight: 155 lb 9.6 oz (70.6 kg)  Height: 5\' 8"  (1.727 m)   Body mass index is 23.66 kg/m.  Physical Exam Vitals and nursing note reviewed.  Constitutional:      General: She is not in acute distress.    Appearance: Normal appearance. She is well-developed. She is not diaphoretic.  HENT:     Head: Normocephalic and atraumatic.     Nose: Nose normal.     Mouth/Throat:     Pharynx: No oropharyngeal exudate.  Eyes:     Pupils: Pupils are equal, round, and reactive to light.  Neck:     Thyroid: No thyromegaly.     Vascular: No JVD.     Trachea: No tracheal deviation.  Cardiovascular:     Rate and Rhythm: Normal rate and regular rhythm.     Heart sounds: Normal heart sounds. No murmur heard.  No friction rub. No gallop.      Comments: Moderate and tender varicose veins on both lower extremities.  Pulmonary:     Effort: Pulmonary effort is normal. No respiratory distress.     Breath sounds: Normal breath sounds. No wheezing or rales.  Chest:     Chest wall: No tenderness.  Abdominal:     Palpations: Abdomen is soft.  Musculoskeletal:        General: Normal  range of motion.     Cervical back: Normal range of motion and neck supple.       Legs:  Lymphadenopathy:     Cervical: No cervical adenopathy.  Skin:    General: Skin is warm and dry.  Neurological:     Mental Status: She is alert and oriented to person, place, and time.     Cranial Nerves: No cranial nerve deficit.     Deep Tendon Reflexes: Reflexes normal.  Psychiatric:        Mood and Affect: Mood normal.        Behavior: Behavior normal.  Thought Content: Thought content normal.        Judgment: Judgment normal.   Assessment/Plan: 1. Baker's cyst of knee, right Reviewed lower extremity ultrasound with patient which did show 3.5cm baker's xyster in popliteal fossa on right side.  Due to this and moderate, tender varicose veins, refer to vein and vascular for further evaluation and treatment.  - Ambulatory referral to Vascular Surgery  2. Varicose veins of both lower extremities with inflammation Reviewed lower extremity ultrasound with patient which did show 3.5cm baker's xyster in popliteal fossa on right side.  Due to this and moderate, tender varicose veins, refer to vein and vascular for further evaluation and treatment.  - Ambulatory referral to Vascular Surgery  3. Irritable bowel syndrome with constipation D/c rx for trulance. Start linzess 166mcg daily. New manufacturer coupon given today.  - linaclotide (LINZESS) 145 MCG CAPS capsule; Take 1 capsule (145 mcg total) by mouth daily before breakfast.  Dispense: 90 capsule; Refill: 1  General Counseling: arretta toenjes understanding of the findings of todays visit and agrees with plan of treatment. I have discussed any further diagnostic evaluation that may be needed or ordered today. We also reviewed her medications today. she has been encouraged to call the office with any questions or concerns that should arise related to todays visit.   This patient was seen by Leretha Pol FNP Collaboration with Dr Lavera Guise as a part of collaborative care agreement  Orders Placed This Encounter  Procedures  . Ambulatory referral to Vascular Surgery    Meds ordered this encounter  Medications  . linaclotide (LINZESS) 145 MCG CAPS capsule    Sig: Take 1 capsule (145 mcg total) by mouth daily before breakfast.    Dispense:  90 capsule    Refill:  1    copay card given to patient    Order Specific Question:   Supervising Provider    Answer:   Lavera Guise [[id number]]    Total time spent: 30  Minutes    Time spent includes review of chart, medications, test results, and follow up plan with the patient.      Dr Lavera Guise Internal medicine

## 2019-12-11 DIAGNOSIS — I8312 Varicose veins of left lower extremity with inflammation: Secondary | ICD-10-CM | POA: Insufficient documentation

## 2019-12-17 ENCOUNTER — Other Ambulatory Visit (INDEPENDENT_AMBULATORY_CARE_PROVIDER_SITE_OTHER): Payer: Self-pay | Admitting: Nurse Practitioner

## 2019-12-17 DIAGNOSIS — I8311 Varicose veins of right lower extremity with inflammation: Secondary | ICD-10-CM

## 2019-12-17 DIAGNOSIS — M7121 Synovial cyst of popliteal space [Baker], right knee: Secondary | ICD-10-CM

## 2019-12-23 ENCOUNTER — Other Ambulatory Visit: Payer: Self-pay

## 2019-12-23 ENCOUNTER — Encounter (INDEPENDENT_AMBULATORY_CARE_PROVIDER_SITE_OTHER): Payer: Self-pay | Admitting: Nurse Practitioner

## 2019-12-23 ENCOUNTER — Ambulatory Visit (INDEPENDENT_AMBULATORY_CARE_PROVIDER_SITE_OTHER): Payer: BC Managed Care – PPO | Admitting: Nurse Practitioner

## 2019-12-23 ENCOUNTER — Ambulatory Visit (INDEPENDENT_AMBULATORY_CARE_PROVIDER_SITE_OTHER): Payer: BC Managed Care – PPO

## 2019-12-23 VITALS — BP 119/73 | HR 60 | Ht 68.0 in | Wt 153.0 lb

## 2019-12-23 DIAGNOSIS — M7120 Synovial cyst of popliteal space [Baker], unspecified knee: Secondary | ICD-10-CM

## 2019-12-23 DIAGNOSIS — M7121 Synovial cyst of popliteal space [Baker], right knee: Secondary | ICD-10-CM | POA: Diagnosis not present

## 2019-12-23 DIAGNOSIS — I8311 Varicose veins of right lower extremity with inflammation: Secondary | ICD-10-CM | POA: Diagnosis not present

## 2019-12-23 DIAGNOSIS — I8312 Varicose veins of left lower extremity with inflammation: Secondary | ICD-10-CM | POA: Diagnosis not present

## 2019-12-23 NOTE — Progress Notes (Signed)
Subjective:    Patient ID: Judith Gomez, female    DOB: Feb 28, 1971, 49 y.o.   MRN: [id number] Chief Complaint  Patient presents with  . New Patient (Initial Visit)    Varicose veins W/ pain BLE ven Reflux    The patient is seen for evaluation of symptomatic varicose veins. The patient relates burning and stinging which worsened steadily throughout the course of the day, particularly with standing. The patient also notes an aching and throbbing pain over the varicosities, particularly with prolonged dependent positions. The symptoms are significantly improved with elevation.  The patient also notes that during hot weather the symptoms are greatly intensified. The patient states the pain from the varicose veins interferes with work, daily exercise, shopping and household maintenance. At this point, the symptoms are persistent and severe enough that they're having a negative impact on lifestyle and are interfering with daily activities.  There is no history of DVT, PE or superficial thrombophlebitis. There is no history of ulceration or hemorrhage.  However, in the popliteal space the patient had some severe bruising however this is thought to be associated with a ruptured Baker's cyst. OB history: G4P4  The patient hasworn graduated compression in the past. At the present time the patient has been using over-the-counter analgesics. There is no history of prior surgical intervention or sclerotherapy.  The patient also endorses regular elevation and she exercises on a regular basis.  Today noninvasive studies show no evidence of DVT or superficial venous thrombosis bilaterally.  No evidence of deep venous insufficiency seen bilaterally.  No evidence of venous reflux in the bilateral short saphenous veins.  The left lower extremity has no evidence of venous reflux.  The right lower extremity has evidence of venous reflux in the great saphenous vein at the saphenofemoral junction extending to the  proximal calf.  The vein diameters range from 0.37 cm to 0.43 cm.    Review of Systems  Cardiovascular:       Tender, varicose veins  All other systems reviewed and are negative.      Objective:   Physical Exam Vitals reviewed.  HENT:     Head: Normocephalic.  Cardiovascular:     Rate and Rhythm: Normal rate.     Pulses: Normal pulses.     Comments: Large varicosity right lower extremity with spider varicosities Pulmonary:     Effort: Pulmonary effort is normal.  Skin:    General: Skin is warm and dry.     Capillary Refill: Capillary refill takes less than 2 seconds.  Neurological:     Mental Status: She is alert and oriented to person, place, and time.  Psychiatric:        Mood and Affect: Mood normal.        Behavior: Behavior normal.        Thought Content: Thought content normal.        Judgment: Judgment normal.     BP 119/73   Pulse 60   Ht 5\' 8"  (1.727 m)   Wt 153 lb (69.4 kg)   BMI 23.26 kg/m   Past Medical History:  Diagnosis Date  . Anemia   . Arthritis   . Chronic kidney disease   . Heart murmur     Social History   Socioeconomic History  . Marital status: Married    Spouse name: Not on file  . Number of children: Not on file  . Years of education: Not on file  . Highest education level:  Not on file  Occupational History  . Not on file  Tobacco Use  . Smoking status: Never Smoker  . Smokeless tobacco: Never Used  Vaping Use  . Vaping Use: Never used  Substance and Sexual Activity  . Alcohol use: No  . Drug use: No  . Sexual activity: Not on file  Other Topics Concern  . Not on file  Social History Narrative  . Not on file   Social Determinants of Health   Financial Resource Strain:   . Difficulty of Paying Living Expenses:   Food Insecurity:   . Worried About Charity fundraiser in the Last Year:   . Arboriculturist in the Last Year:   Transportation Needs:   . Film/video editor (Medical):   Marland Kitchen Lack of Transportation  (Non-Medical):   Physical Activity:   . Days of Exercise per Week:   . Minutes of Exercise per Session:   Stress:   . Feeling of Stress :   Social Connections:   . Frequency of Communication with Friends and Family:   . Frequency of Social Gatherings with Friends and Family:   . Attends Religious Services:   . Active Member of Clubs or Organizations:   . Attends Archivist Meetings:   Marland Kitchen Marital Status:   Intimate Partner Violence:   . Fear of Current or Ex-Partner:   . Emotionally Abused:   Marland Kitchen Physically Abused:   . Sexually Abused:     Past Surgical History:  Procedure Laterality Date  . CESAREAN SECTION    . COLONOSCOPY    . COLONOSCOPY WITH PROPOFOL N/A 01/04/2019   Procedure: COLONOSCOPY WITH PROPOFOL;  Surgeon: Jonathon Bellows, MD;  Location: [facility] ENDOSCOPY;  Service: Gastroenterology;  Laterality: N/A;  . KNEE ARTHROSCOPY Left 11/18/2014   Procedure: ARTHROSCOPY KNEE;  Surgeon: Hessie Knows, MD;  Location: [facility] ORS;  Service: Orthopedics;  Laterality: Left;   partial medial menisectomy  . THUMB FUSION Left 2014  . TUBAL LIGATION    . WISDOM TOOTH EXTRACTION      Family History  Problem Relation Age of Onset  . Breast cancer Neg Hx     Allergies  Allergen Reactions  . Oxycodone     Pt stated, "Made me feel mean"  . Tape Rash    Surgical tape used for C-section       Assessment & Plan:   1. Varicose veins of both lower extremities with inflammation Recommend  I have reviewed my previous  discussion with the patient regarding  varicose veins and why they cause symptoms. Patient will continue  wearing graduated compression stockings class 1 on a daily basis, beginning first thing in the morning and removing them in the evening.    In addition, behavioral modification including elevation during the day was again discussed and this will continue.  The patient has utilized over the counter pain medications and has been exercising.  However, at this time  conservative therapy has not alleviated the patient's symptoms of leg pain and swelling  Recommend: laser ablation of the right great saphenous veins to eliminate the symptoms of pain and swelling of the lower extremities caused by the severe superficial venous reflux disease.   2. Baker's cyst of knee, unspecified laterality No evidence of Baker's cyst on ultrasound today.  It is possible that during discomfort is a structure which is why we are unable to see it today.  Patient will continue to follow-up with PCP regards to the [facility]  cyst.   Current Outpatient Medications on File Prior to Visit  Medication Sig Dispense Refill  . acetaminophen (TYLENOL) 325 MG tablet Take 1 tablet by mouth.    . ergocalciferol (DRISDOL) 1.25 MG (50000 UT) capsule Take 1 capsule (50,000 Units total) by mouth once a week. 4 capsule 5  . linaclotide (LINZESS) 145 MCG CAPS capsule Take 1 capsule (145 mcg total) by mouth daily before breakfast. 90 capsule 1  . Multiple Vitamin (MULTIVITAMIN) capsule Take 1 capsule by mouth daily.     No current facility-administered medications on file prior to visit.    There are no Patient Instructions on file for this visit. No follow-ups on file.   Kris Hartmann, NP

## 2020-02-26 ENCOUNTER — Other Ambulatory Visit: Payer: Self-pay | Admitting: Radiology

## 2020-02-26 ENCOUNTER — Other Ambulatory Visit: Payer: BC Managed Care – PPO

## 2020-02-26 DIAGNOSIS — Z20822 Contact with and (suspected) exposure to covid-19: Secondary | ICD-10-CM

## 2020-02-27 LAB — SARS-COV-2, NAA 2 DAY TAT

## 2020-02-27 LAB — NOVEL CORONAVIRUS, NAA: SARS-CoV-2, NAA: NOT DETECTED

## 2020-03-03 ENCOUNTER — Other Ambulatory Visit: Payer: Self-pay

## 2020-03-03 ENCOUNTER — Other Ambulatory Visit: Payer: BC Managed Care – PPO

## 2020-03-03 DIAGNOSIS — Z20822 Contact with and (suspected) exposure to covid-19: Secondary | ICD-10-CM

## 2020-03-05 LAB — NOVEL CORONAVIRUS, NAA: SARS-CoV-2, NAA: NOT DETECTED

## 2020-03-05 LAB — SPECIMEN STATUS REPORT

## 2020-03-05 LAB — SARS-COV-2, NAA 2 DAY TAT

## 2020-03-17 ENCOUNTER — Encounter: Payer: BC Managed Care – PPO | Admitting: Obstetrics and Gynecology

## 2020-03-26 ENCOUNTER — Other Ambulatory Visit: Payer: Self-pay

## 2020-03-26 ENCOUNTER — Encounter: Payer: Self-pay | Admitting: Obstetrics and Gynecology

## 2020-03-26 ENCOUNTER — Ambulatory Visit (INDEPENDENT_AMBULATORY_CARE_PROVIDER_SITE_OTHER): Payer: BC Managed Care – PPO | Admitting: Obstetrics and Gynecology

## 2020-03-26 VITALS — BP 148/77 | HR 80 | Wt 144.2 lb

## 2020-03-26 DIAGNOSIS — Z01419 Encounter for gynecological examination (general) (routine) without abnormal findings: Secondary | ICD-10-CM | POA: Diagnosis not present

## 2020-03-26 DIAGNOSIS — N951 Menopausal and female climacteric states: Secondary | ICD-10-CM

## 2020-03-26 DIAGNOSIS — Z30431 Encounter for routine checking of intrauterine contraceptive device: Secondary | ICD-10-CM

## 2020-03-26 NOTE — Progress Notes (Signed)
HPI:      [name] is a 49 y.o. No obstetric history on file. who LMP was No LMP recorded. (Menstrual status: IUD).  Subjective:   She presents today for her annual examination.  She has no complaints today.  She does state that she occasionally has night sweats and is wondering about menopause.  She has an IUD so does not have menstrual periods.   She states that she gets her Pap smear mammogram and general lab work through her PCP. She has been vaccinated for Covid.    Hx: The following portions of the patient's history were reviewed and updated as appropriate:             She  has a past medical history of Anemia, Arthritis, Chronic kidney disease, and Heart murmur. She does not have any pertinent problems on file. She  has a past surgical history that includes Cesarean section; Tubal ligation; Colonoscopy; Wisdom tooth extraction; Thumb fusion (Left, 2014); Knee arthroscopy (Left, 11/18/2014); and Colonoscopy with propofol (N/A, 01/04/2019). Her family history is not on file. She  reports that she has never smoked. She has never used smokeless tobacco. She reports that she does not drink alcohol and does not use drugs. She has a current medication list which includes the following prescription(s): acetaminophen, ergocalciferol, linaclotide, and multivitamin. She is allergic to oxycodone and tape.       Review of Systems:  Review of Systems  Constitutional: Denied constitutional symptoms, night sweats, recent illness, fatigue, fever, insomnia and weight loss.  Eyes: Denied eye symptoms, eye pain, photophobia, vision change and visual disturbance.  Ears/Nose/Throat/Neck: Denied ear, nose, throat or neck symptoms, hearing loss, nasal discharge, sinus congestion and sore throat.  Cardiovascular: Denied cardiovascular symptoms, arrhythmia, chest pain/pressure, edema, exercise intolerance, orthopnea and palpitations.  Respiratory: Denied pulmonary symptoms, asthma, pleuritic pain,  productive sputum, cough, dyspnea and wheezing.  Gastrointestinal: Denied, gastro-esophageal reflux, melena, nausea and vomiting.  Genitourinary: Denied genitourinary symptoms including symptomatic vaginal discharge, pelvic relaxation issues, and urinary complaints.  Musculoskeletal: Denied musculoskeletal symptoms, stiffness, swelling, muscle weakness and myalgia.  Dermatologic: Denied dermatology symptoms, rash and scar.  Neurologic: Denied neurology symptoms, dizziness, headache, neck pain and syncope.  Psychiatric: Denied psychiatric symptoms, anxiety and depression.  Endocrine:  Occasional night sweats   Meds:   Current Outpatient Medications on File Prior to Visit  Medication Sig Dispense Refill  . acetaminophen (TYLENOL) 325 MG tablet Take 1 tablet by mouth.    . ergocalciferol (DRISDOL) 1.25 MG (50000 UT) capsule Take 1 capsule (50,000 Units total) by mouth once a week. 4 capsule 5  . linaclotide (LINZESS) 145 MCG CAPS capsule Take 1 capsule (145 mcg total) by mouth daily before breakfast. 90 capsule 1  . Multiple Vitamin (MULTIVITAMIN) capsule Take 1 capsule by mouth daily.     No current facility-administered medications on file prior to visit.          Objective:     Vitals:   03/26/20 0730  BP: (!) 148/77  Pulse: 80    Filed Weights   03/26/20 0730  Weight: 144 lb 3.2 oz (65.4 kg)              Physical examination General NAD, Conversant  HEENT Atraumatic; Op clear with mmm.  Normo-cephalic. Pupils reactive. Anicteric sclerae  Thyroid/Neck Smooth without nodularity or enlargement. Normal ROM.  Neck Supple.  Skin No rashes, lesions or ulceration. Normal palpated skin turgor. No nodularity.  Breasts: No masses or discharge.  Symmetric.  No axillary adenopathy.  Lungs: Clear to auscultation.No rales or wheezes. Normal Respiratory effort, no retractions.  Heart: NSR.  No murmurs or rubs appreciated. No periferal edema  Abdomen: Soft.  Non-tender.  No masses.  No  HSM. No hernia  Extremities: Moves all appropriately.  Normal ROM for age. No lymphadenopathy.  Neuro: Oriented to PPT.  Normal mood. Normal affect.     Pelvic:   Vulva: Normal appearance.  No lesions.  Vagina: No lesions or abnormalities noted.  Support: Normal pelvic support.  Urethra No masses tenderness or scarring.  Meatus Normal size without lesions or prolapse.  Cervix: Normal appearance.  No lesions.  Anus: Normal exam.  No lesions.  Perineum: Normal exam.  No lesions.        Bimanual   Uterus: Normal size.  Non-tender.  Mobile.  AV.  Adnexae: No masses.  Non-tender to palpation.  Cul-de-sac: Negative for abnormality.      Assessment:    No obstetric history on file. Patient Active Problem List   Diagnosis Date Noted  . Varicose veins of both lower extremities with inflammation 12/11/2019  . Pain in both lower extremities 11/05/2019  . Baker's cyst of knee, unspecified laterality 11/05/2019  . Irritable bowel syndrome with constipation 11/05/2019  . Encounter for general adult medical examination with abnormal findings 08/19/2019  . Chronic right shoulder pain 08/19/2019  . Acute otitis media 11/19/2018  . Screening for colon cancer 08/14/2018  . Dysuria 08/14/2018  . Vitamin D deficiency 08/14/2018  . Excessive bleeding in premenopausal period 07/04/2018  . Uterine leiomyoma 07/04/2018  . Urinary tract infection with hematuria 04/05/2018  . Acute non-recurrent sinusitis 04/05/2018  . Acute low back pain 04/05/2018  . Chest pain 01/17/2018  . Generalized anxiety disorder 01/17/2018  . Acute otitis externa of both ears 01/17/2018  . Subacromial bursitis of right shoulder joint 12/15/2017  . Pain in right hand 09/18/2017  . 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 Melanoma ([place]) 01/10/2017  . History of anemia 01/17/2014  . Morton's neuroma 01/17/2014  . Multiple renal cysts 01/17/2014  . Psoriatic arthritis ([place]) 01/17/2014     1. Well  woman exam with routine gynecological exam   2. Symptomatic menopausal or female climacteric states   3. Surveillance of previously prescribed intrauterine contraceptive device     Patient possibly in menopause, possibly in climacteric based on symptoms.  No problems with IUD.   Plan:            1.  Basic Screening Recommendations The basic screening recommendations for asymptomatic women were discussed with the patient during her visit.  The age-appropriate recommendations were discussed with her and the rational for the tests reviewed.  When I am informed by the patient that another primary care physician has previously obtained the age-appropriate tests and they are up-to-date, only outstanding tests are ordered and referrals given as necessary.  Abnormal results of tests will be discussed with her when all of her results are completed.  Routine preventative health maintenance measures emphasized: Exercise/Diet/Weight control, Tobacco Warnings, Alcohol/Substance use risks and Stress Management 2.  FSH to rule out menopause-consider HRT if elevated. Orders No orders of the defined types were placed in this encounter.   No orders of the defined types were placed in this encounter.           F/U  Return in about 1 year (around 03/26/2021) for Annual Physical, We will contact her with any abnormal test results.  Finis Bud, M.D. 03/26/2020 8:19 AM

## 2020-03-27 LAB — FOLLICLE STIMULATING HORMONE: FSH: 64.4 m[IU]/mL

## 2020-03-31 ENCOUNTER — Telehealth: Payer: Self-pay

## 2020-03-31 NOTE — Telephone Encounter (Signed)
Detailed message left on patients voice mail regarding Dr Amalia Hailey instructions.

## 2020-05-28 ENCOUNTER — Other Ambulatory Visit: Payer: Self-pay

## 2020-05-28 DIAGNOSIS — E559 Vitamin D deficiency, unspecified: Secondary | ICD-10-CM

## 2020-05-28 MED ORDER — ERGOCALCIFEROL 1.25 MG (50000 UT) PO CAPS: 50000.0000 [IU] | ORAL_CAPSULE | ORAL | 5 refills | Status: DC

## 2020-06-02 ENCOUNTER — Other Ambulatory Visit: Payer: Self-pay

## 2020-06-02 DIAGNOSIS — K581 Irritable bowel syndrome with constipation: Secondary | ICD-10-CM

## 2020-06-02 MED ORDER — LINACLOTIDE 145 MCG PO CAPS: 145.0000 ug | ORAL_CAPSULE | Freq: Every day | ORAL | 1 refills | Status: DC

## 2020-07-21 ENCOUNTER — Ambulatory Visit: Payer: PRIVATE HEALTH INSURANCE

## 2020-08-21 ENCOUNTER — Encounter: Payer: BC Managed Care – PPO | Admitting: Hospice and Palliative Medicine

## 2020-08-21 ENCOUNTER — Encounter: Payer: Self-pay | Admitting: Physician Assistant

## 2020-08-21 ENCOUNTER — Ambulatory Visit (INDEPENDENT_AMBULATORY_CARE_PROVIDER_SITE_OTHER): Payer: BC Managed Care – PPO | Admitting: Physician Assistant

## 2020-08-21 DIAGNOSIS — K581 Irritable bowel syndrome with constipation: Secondary | ICD-10-CM | POA: Diagnosis not present

## 2020-08-21 DIAGNOSIS — Z0001 Encounter for general adult medical examination with abnormal findings: Secondary | ICD-10-CM

## 2020-08-21 DIAGNOSIS — R011 Cardiac murmur, unspecified: Secondary | ICD-10-CM

## 2020-08-21 DIAGNOSIS — Z124 Encounter for screening for malignant neoplasm of cervix: Secondary | ICD-10-CM

## 2020-08-21 DIAGNOSIS — R300 Dysuria: Secondary | ICD-10-CM

## 2020-08-21 DIAGNOSIS — I8311 Varicose veins of right lower extremity with inflammation: Secondary | ICD-10-CM

## 2020-08-21 DIAGNOSIS — D259 Leiomyoma of uterus, unspecified: Secondary | ICD-10-CM

## 2020-08-21 DIAGNOSIS — I8312 Varicose veins of left lower extremity with inflammation: Secondary | ICD-10-CM

## 2020-08-21 NOTE — Progress Notes (Signed)
[facility] [place], [place] [address]  Internal MEDICINE  Office Visit Note  Patient Name: Judith Gomez  [id number]  [id number]  Date of Service: 08/21/2020  Chief Complaint  Patient presents with  . Annual Exam    Refill request, spot on finger  . Anemia  . Telephone Screen     HPI Pt is here for routine health maintenance examination -She had a stomach bug a few weeks ago, but is getting better, just rehydrating still. Hemorrhoids flared some and is using cream that is helping. Linzess works well for her.  -She is taking weekly vitamin D currently -Something on R index finger popped up the other day. It is not bothersome and looks like a small wart. She will monitor it. -Sees OBGYN for next well woman exam oct 18th. She has an IUD for hx of heavy bleeding and they are monitoring her menopausal symptoms. She is not on HRT at this time. -She has letter to set up mammo and will make appt. Colonscopy up to date. -Lab slip given  Current Medication: Outpatient Encounter Medications as of 08/21/2020  Medication Sig  . acetaminophen (TYLENOL) 325 MG tablet Take 1 tablet by mouth.  . ergocalciferol (DRISDOL) 1.25 MG (50000 UT) capsule Take 1 capsule (50,000 Units total) by mouth once a week.  . linaclotide (LINZESS) 145 MCG CAPS capsule Take 1 capsule (145 mcg total) by mouth daily before breakfast.  . Multiple Vitamin (MULTIVITAMIN) capsule Take 1 capsule by mouth daily.   No facility-administered encounter medications on file as of 08/21/2020.    Surgical History: Past Surgical History:  Procedure Laterality Date  . CESAREAN SECTION    . COLONOSCOPY    . COLONOSCOPY WITH PROPOFOL N/A 01/04/2019   Procedure: COLONOSCOPY WITH PROPOFOL;  Surgeon: Jonathon Bellows, MD;  Location: [facility] ENDOSCOPY;  Service: Gastroenterology;  Laterality: N/A;  . KNEE ARTHROSCOPY Left 11/18/2014   Procedure: ARTHROSCOPY KNEE;  Surgeon: Hessie Knows, MD;  Location: [facility] ORS;   Service: Orthopedics;  Laterality: Left;   partial medial menisectomy  . THUMB FUSION Left 2014  . TUBAL LIGATION    . WISDOM TOOTH EXTRACTION      Medical History: Past Medical History:  Diagnosis Date  . Anemia   . Arthritis   . Chronic kidney disease   . Heart murmur     Family History: Family History  Problem Relation Age of Onset  . Heart disease Mother   . Hyperlipidemia Mother   . Diabetes Father   . Cancer Father   . Arthritis Sister   . Anxiety disorder Sister   . Cancer Brother   . Arthritis Sister   . Arthritis Sister   . Breast cancer Neg Hx       Review of Systems  Constitutional: Negative for chills, fatigue and unexpected weight change.  HENT: Negative for congestion, postnasal drip, rhinorrhea, sneezing and sore throat.   Eyes: Negative for redness.  Respiratory: Negative for cough, chest tightness and shortness of breath.   Cardiovascular: Negative for chest pain and palpitations.  Gastrointestinal: Positive for constipation. Negative for abdominal pain, blood in stool, diarrhea, nausea and vomiting.  Genitourinary: Negative for dysuria and frequency.  Musculoskeletal: Negative for arthralgias, back pain, joint swelling and neck pain.  Skin: Negative for rash.       Small bump on index finger, not bothersome  Neurological: Negative.  Negative for tremors and numbness.  Hematological: Negative for adenopathy. Does not bruise/bleed easily.  Psychiatric/Behavioral: Negative  for behavioral problems (Depression), sleep disturbance and suicidal ideas. The patient is not nervous/anxious.      Vital Signs: BP 118/74   Pulse 72   Temp (!) 97.4 F (36.3 C)   Resp 16   Ht 5\' 8"  (1.727 m)   Wt 149 lb 3.2 oz (67.7 kg)   SpO2 98%   BMI 22.69 kg/m    Physical Exam Constitutional:      General: She is not in acute distress.    Appearance: She is well-developed and normal weight. She is not diaphoretic.  HENT:     Head: Normocephalic and atraumatic.      Right Ear: External ear normal.     Left Ear: External ear normal.     Nose: Nose normal.     Mouth/Throat:     Pharynx: No oropharyngeal exudate.  Eyes:     General: No scleral icterus.       Right eye: No discharge.        Left eye: No discharge.     Conjunctiva/sclera: Conjunctivae normal.     Pupils: Pupils are equal, round, and reactive to light.  Neck:     Thyroid: No thyromegaly.     Vascular: No JVD.     Trachea: No tracheal deviation.  Cardiovascular:     Rate and Rhythm: Normal rate and regular rhythm.     Pulses: Normal pulses.     Heart sounds: Murmur heard.  No friction rub. No gallop.   Pulmonary:     Effort: Pulmonary effort is normal. No respiratory distress.     Breath sounds: Normal breath sounds. No stridor. No wheezing or rales.  Chest:     Chest wall: No tenderness.  Breasts:     Right: Normal. No mass.     Left: Normal. No mass.    Abdominal:     General: Bowel sounds are normal. There is no distension.     Palpations: Abdomen is soft. There is no mass.     Tenderness: There is no abdominal tenderness. There is no guarding or rebound.  Musculoskeletal:        General: No tenderness or deformity. Normal range of motion.     Cervical back: Normal range of motion and neck supple.  Lymphadenopathy:     Cervical: No cervical adenopathy.  Skin:    General: Skin is warm and dry.     Coloration: Skin is not pale.     Findings: No erythema or rash.     Comments: Small bump on R index finger, no warmth or erythema. Consistent with small wart  Neurological:     Mental Status: She is alert.     Cranial Nerves: No cranial nerve deficit.     Motor: No abnormal muscle tone.     Coordination: Coordination normal.     Deep Tendon Reflexes: Reflexes are normal and symmetric.  Psychiatric:        Behavior: Behavior normal.        Thought Content: Thought content normal.        Judgment: Judgment normal.      LABS: No results found for this or any  previous visit (from the past 2160 hour(s)).     Assessment/Plan: 1. Encounter for general adult medical examination with abnormal findings Lab slip given for routine labs and vitamin D. She will call to schedule her mammogram. UTD on colonoscopy.  2. Irritable bowel syndrome with constipation Doing well on Linzess, will continue.  3.  Varicose veins of both lower extremities with inflammation Followed by vascular. Pt deciding on whether to have procedure done.  4. Murmur, cardiac Murmur heard in office today will obtain an echo. - ECHO COMPLETE (BACK OFFICE); Future  5. Uterine leiomyoma, unspecified location Followed by OBGYN. Pt has IUD in place to help with hx of heavy bleeding.  6. Dysuria - UA/M w/rflx Culture, Routine   General Counseling: galya dunnigan understanding of the findings of todays visit and agrees with plan of treatment. I have discussed any further diagnostic evaluation that may be needed or ordered today. We also reviewed her medications today. she has been encouraged to call the office with any questions or concerns that should arise related to todays visit.    Counseling:    Orders Placed This Encounter  Procedures  . UA/M w/rflx Culture, Routine  . ECHO COMPLETE (BACK OFFICE)    No orders of the defined types were placed in this encounter.   This patient was seen by Drema Dallas, PA-C in collaboration with Dr. Clayborn Bigness as a part of collaborative care agreement.  Total time spent:30 Minutes  Time spent includes review of chart, medications, test results, and follow up plan with the patient.     Lavera Guise, MD  Internal Medicine

## 2020-08-22 LAB — UA/M W/RFLX CULTURE, ROUTINE
Bilirubin, UA: NEGATIVE
Glucose, UA: NEGATIVE
Ketones, UA: NEGATIVE
Leukocytes,UA: NEGATIVE
Nitrite, UA: NEGATIVE
Protein,UA: NEGATIVE
RBC, UA: NEGATIVE
Specific Gravity, UA: 1.02 (ref 1.005–1.030)
Urobilinogen, Ur: 0.2 mg/dL (ref 0.2–1.0)
pH, UA: 5 (ref 5.0–7.5)

## 2020-08-22 LAB — MICROSCOPIC EXAMINATION
Bacteria, UA: NONE SEEN
Casts: NONE SEEN /lpf
Epithelial Cells (non renal): NONE SEEN /hpf (ref 0–10)
WBC, UA: NONE SEEN /hpf (ref 0–5)

## 2020-09-02 ENCOUNTER — Ambulatory Visit: Payer: PRIVATE HEALTH INSURANCE

## 2020-09-09 ENCOUNTER — Other Ambulatory Visit: Payer: Self-pay

## 2020-09-09 ENCOUNTER — Ambulatory Visit: Payer: BC Managed Care – PPO

## 2020-09-09 DIAGNOSIS — K581 Irritable bowel syndrome with constipation: Secondary | ICD-10-CM

## 2020-09-09 DIAGNOSIS — R011 Cardiac murmur, unspecified: Secondary | ICD-10-CM | POA: Diagnosis not present

## 2020-09-09 DIAGNOSIS — Z0001 Encounter for general adult medical examination with abnormal findings: Secondary | ICD-10-CM

## 2020-09-09 DIAGNOSIS — R300 Dysuria: Secondary | ICD-10-CM

## 2020-09-09 DIAGNOSIS — I8311 Varicose veins of right lower extremity with inflammation: Secondary | ICD-10-CM

## 2020-09-09 DIAGNOSIS — D259 Leiomyoma of uterus, unspecified: Secondary | ICD-10-CM

## 2020-10-15 DIAGNOSIS — D485 Neoplasm of uncertain behavior of skin: Secondary | ICD-10-CM | POA: Diagnosis not present

## 2020-10-15 DIAGNOSIS — D2261 Melanocytic nevi of right upper limb, including shoulder: Secondary | ICD-10-CM | POA: Diagnosis not present

## 2020-10-15 DIAGNOSIS — C44519 Basal cell carcinoma of skin of other part of trunk: Secondary | ICD-10-CM | POA: Diagnosis not present

## 2020-10-15 DIAGNOSIS — D2271 Melanocytic nevi of right lower limb, including hip: Secondary | ICD-10-CM | POA: Diagnosis not present

## 2020-10-15 DIAGNOSIS — Z85820 Personal history of malignant melanoma of skin: Secondary | ICD-10-CM | POA: Diagnosis not present

## 2020-10-15 DIAGNOSIS — Z85828 Personal history of other malignant neoplasm of skin: Secondary | ICD-10-CM | POA: Diagnosis not present

## 2020-10-26 DIAGNOSIS — C44519 Basal cell carcinoma of skin of other part of trunk: Secondary | ICD-10-CM | POA: Diagnosis not present

## 2020-11-17 ENCOUNTER — Ambulatory Visit: Admit: 2020-11-17 | Payer: PRIVATE HEALTH INSURANCE

## 2020-11-17 DIAGNOSIS — Z0000 Encounter for general adult medical examination without abnormal findings: Secondary | ICD-10-CM

## 2020-11-17 NOTE — Unmapped (Signed)
Patient Name: Julie Molina  Date of visit: 11/17/2020  Medical Record Number: [id number]    Julie Molina is a 50 y.o. [id number] female here for her annual gyn exam. Down 21#.  Patient w/o complaints. 4 grown kids (20's and late teens) and 62 yr old foster son (development delay) and 46 yo foster son (CP). Hubby is well.  Teaches classes - cardio and strength. She also strength trains and cardio. Drinking water  Menstrual History   Patient's last menstrual period was 11/08/2020 (approximate). PMP 2 wks ago - 1st time this has happened. Always hot. ?moods - she is on meds.   Menses regular yes, lasting <7 days yes, manageable  Contraception: vasectomy   Happy with contraception    desires to continue   Issues with sex - no issues  Last pap smear: 2019 NL   H/O abnormal pap no  History of high risk HPV: no  Colonoscopy: not yet - will discuss w/ PCP  Mammogram: sched for next wk  HPV vaccine: n/a  DEXA n/a      The following past medical history, surgical history, family history and social history were updated  and reviewed at today's visit.    Past Medical History:   Diagnosis Date   ??? Anxiety    ??? Depression    ??? Melanoma (CMS Dx)      Past Surgical History:   Procedure Laterality Date   ??? CESAREAN SECTION  1997   ??? ear spur  1998    right      Family History   Problem Relation Age of Onset   ??? Cancer Mother         lung cancer   ??? Alcohol abuse Brother    ??? Heart disease Maternal Grandmother    ??? Heart disease Maternal Grandfather    ??? Alcohol abuse Sister       [place]  OB History     Gravida   6    Para   3    Term   3    Preterm        AB   3    Living   3       SAB   3    IAB        Ectopic        Multiple        Live Births   3             PHM@  REVIEW OF SYSTEMS  Constitutional ROS: negative  Psychiatric ROS: negative   Hematological  ROS: negative  Endocrine ROS: taking metformin  Respiratory ROS: negative  Cardiovascular ROS: negative  Gastrointestinal ROS: negative  GU ROS: negative  Musculoskeletal ROS:  negative  Neurological/Rheum ROS: negative  Integumentary ROS: Derm q 6 mos - h/o melanoma     OBJECTIVE:  Ht 5' 2 (1.575 m)    Wt 135 lb (61.2 kg)    LMP 11/08/2020 (Approximate)    BMI 24.69 kg/m??   Constitutional: alert, no acute distress, well hydrated, well nourished  Skin: normal color, no rashes, no unusual bruising, warm to touch, freckles/moles  ENT: hearing grossly normal, thyroid normal size without nodules  Cardiovascular: lungs CTAB, heart RRR  Breasts: no masses or skin changes appreciated  Extremities: no gross deformities, normal joint mobility, edema no, varicosities no  Neuro: oriented to time, place & person; affect & mood appropriate  Gastrointestinal: Abdomen- nondistended, nontender, no masses, scars yes  Genitourinary:    Ext. Genitalia: Pink, no visible lesions noted, normal architecture, hemorrhoids n   Vagina: relaxed caliber. Cystocele slight , rectocele yes  ?? Urethra- midline  ?? Cervix - non- friable, without lesions or polyps, mucus  ?? Epithelium - normal physiologic moisture,  vaginal lesions/discharge - mucus  ?? Uterus- nontender, mobile, mid  ?? Adnexa- nontender without masses  ?? Bimanual  - no palpable pelvic mass(s)     ASSESSMENT / PLAN:     Annual gynecological exam performed. Patient will come back in a year unless there are new symptoms.  Pap, next 3 yrs    Julie Hoit, MD  11/17/2020         .

## 2020-11-21 ENCOUNTER — Other Ambulatory Visit: Payer: Self-pay | Admitting: Nurse Practitioner

## 2020-11-21 DIAGNOSIS — E559 Vitamin D deficiency, unspecified: Secondary | ICD-10-CM

## 2020-11-24 ENCOUNTER — Telehealth: Payer: Self-pay

## 2020-11-24 ENCOUNTER — Other Ambulatory Visit: Payer: Self-pay

## 2020-11-24 DIAGNOSIS — E559 Vitamin D deficiency, unspecified: Secondary | ICD-10-CM

## 2020-11-24 DIAGNOSIS — M778 Other enthesopathies, not elsewhere classified: Secondary | ICD-10-CM | POA: Diagnosis not present

## 2020-11-24 MED ORDER — ERGOCALCIFEROL 1.25 MG (50000 UT) PO CAPS: 50000.0000 [IU] | ORAL_CAPSULE | ORAL | 5 refills | Status: DC

## 2020-11-25 ENCOUNTER — Other Ambulatory Visit: Payer: Self-pay | Admitting: Nurse Practitioner

## 2020-11-25 ENCOUNTER — Other Ambulatory Visit: Payer: Self-pay | Admitting: Internal Medicine

## 2020-11-25 DIAGNOSIS — K581 Irritable bowel syndrome with constipation: Secondary | ICD-10-CM

## 2020-11-26 ENCOUNTER — Inpatient Hospital Stay: Admit: 2020-11-26 | Payer: BLUE CROSS/BLUE SHIELD | Primary: Internal Medicine

## 2020-11-26 ENCOUNTER — Encounter

## 2020-11-26 DIAGNOSIS — Z1231 Encounter for screening mammogram for malignant neoplasm of breast: Secondary | ICD-10-CM

## 2020-11-26 NOTE — Telephone Encounter (Signed)
LMOM for pt to return call to discuss echo results

## 2020-11-26 NOTE — Telephone Encounter (Signed)
Spoke to pt and discussed echo results and pt said she would come in to discuss further.  Pt will call back to schedule.  She also mentioned that she has been having a sharp pain in the back of her right eye.  Has been seen by eye dr and they said everything looked fine that was a year ago, but has got worse.  wants a referral to neurology but not sure if she needs to come in or if you can order it.  She advised that it effects her speech, and her mind.  It is interrupting her daily ability to function to fill her task.  She is a single parent and hard for her bc she has to work

## 2020-11-26 NOTE — Telephone Encounter (Signed)
Pt also stated that she wants to be set up for the sleep study.

## 2020-11-27 ENCOUNTER — Other Ambulatory Visit: Payer: Self-pay | Admitting: Physician Assistant

## 2020-12-07 ENCOUNTER — Ambulatory Visit: Payer: Self-pay | Admitting: Physician Assistant

## 2020-12-07 ENCOUNTER — Encounter: Payer: Self-pay | Admitting: Physician Assistant

## 2020-12-07 DIAGNOSIS — G479 Sleep disorder, unspecified: Secondary | ICD-10-CM | POA: Diagnosis not present

## 2020-12-07 DIAGNOSIS — I77810 Thoracic aortic ectasia: Secondary | ICD-10-CM

## 2020-12-07 DIAGNOSIS — K219 Gastro-esophageal reflux disease without esophagitis: Secondary | ICD-10-CM

## 2020-12-07 DIAGNOSIS — R479 Unspecified speech disturbances: Secondary | ICD-10-CM

## 2020-12-07 DIAGNOSIS — H5711 Ocular pain, right eye: Secondary | ICD-10-CM

## 2020-12-07 DIAGNOSIS — I517 Cardiomegaly: Secondary | ICD-10-CM | POA: Diagnosis not present

## 2020-12-07 MED ORDER — OMEPRAZOLE 40 MG PO CPDR: 40.0000 mg | DELAYED_RELEASE_CAPSULE | Freq: Every day | ORAL | 3 refills | Status: DC
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
## 2020-12-21 ENCOUNTER — Telehealth: Payer: Self-pay

## 2020-12-21 NOTE — Telephone Encounter (Signed)
Referral faxed to [facility] at [phone]. Judith Gomez

## 2021-01-08 ENCOUNTER — Telehealth: Payer: Self-pay

## 2021-01-08 ENCOUNTER — Ambulatory Visit: Payer: BLUE CROSS/BLUE SHIELD | Admitting: Physician Assistant

## 2021-01-08 NOTE — Telephone Encounter (Signed)
FG WAS UNABLE TO SCHEDULE PT FOR A PSG AS THE PT DECLINED DUE TO FINANCES. PATIENTS INSURANCE DEDUCTIBLE HASN'T BEEN MET THEREFORE SHE IS DECLINING DUE TO COST.

## 2021-01-13 DIAGNOSIS — D485 Neoplasm of uncertain behavior of skin: Secondary | ICD-10-CM | POA: Diagnosis not present

## 2021-01-13 DIAGNOSIS — L570 Actinic keratosis: Secondary | ICD-10-CM | POA: Diagnosis not present

## 2021-01-13 DIAGNOSIS — D044 Carcinoma in situ of skin of scalp and neck: Secondary | ICD-10-CM | POA: Diagnosis not present

## 2021-01-21 ENCOUNTER — Other Ambulatory Visit: Payer: Self-pay

## 2021-01-21 ENCOUNTER — Telehealth: Payer: BLUE CROSS/BLUE SHIELD | Admitting: Physician Assistant

## 2021-01-21 ENCOUNTER — Encounter: Payer: Self-pay | Admitting: Physician Assistant

## 2021-01-21 VITALS — Resp 16 | Ht 68.0 in | Wt 145.0 lb

## 2021-01-21 DIAGNOSIS — U071 COVID-19: Secondary | ICD-10-CM

## 2021-01-21 MED ORDER — AZITHROMYCIN 250 MG PO TABS: ORAL_TABLET | ORAL | 0 refills | Status: DC

## 2021-01-21 MED ORDER — PREDNISONE 10 MG PO TABS: ORAL_TABLET | ORAL | 0 refills | Status: DC

## 2021-01-21 MED ORDER — NIRMATRELVIR/RITONAVIR (PAXLOVID)TABLET: 3.0000 | ORAL_TABLET | Freq: Two times a day (BID) | ORAL | 0 refills | Status: DC

## 2021-01-21 NOTE — Progress Notes (Signed)
[facility] [place], [place] [address]  Internal MEDICINE  Telephone Visit  Patient Name: Judith Gomez  [id number]  [id number]  Date of Service: 01/26/2021  I connected with the patient at 3:48 by telephone and verified the patients identity using two identifiers.   I discussed the limitations, risks, security and privacy concerns of performing an evaluation and management service by telephone and the availability of in person appointments. I also discussed with the patient that there may be a patient responsible charge related to the service.  The patient expressed understanding and agrees to proceed.    Chief Complaint  Patient presents with   Acute Visit    Covid pos today, symptoms started yesterday, feels like she has a huge lump in throat, congestion, runny nose, cough, not sleeping well, taste is fading, pt wants meds    Telephone Assessment    Video or phone call    Telephone Screen    [id number]    Quality Metric Gaps    Mammogram, tetanus done within the last 10 years     HPI Pt is here for virtual sick visit -She tested positive for covid today after she began to feel symptoms yesterday. Symptoms include scratchy throat, runny nose and congestion, cough, and impaired taste -Denies any wheezing or SOB -She has already tried mucinex, advil and flonase  Current Medication: Outpatient Encounter Medications as of 01/21/2021  Medication Sig   azithromycin (ZITHROMAX) 250 MG tablet Take one tab a day for 10 days for uri   predniSONE (DELTASONE) 10 MG tablet Take one tab 3 x day for 3 days, then take one tab 2 x a day for 3 days and then take one tab a day for 3 days for copd   [DISCONTINUED] nirmatrelvir/ritonavir EUA (PAXLOVID) TABS Take 3 tablets by mouth 2 (two) times daily for 5 days.   acetaminophen (TYLENOL) 325 MG tablet Take 1 tablet by mouth.   ergocalciferol (DRISDOL) 1.25 MG (50000 UT) capsule Take 1 capsule (50,000 Units total) by  mouth once a week.   LINZESS 145 MCG CAPS capsule TAKE 1 CAPSULE BY MOUTH DAILY BEFORE BREAKFAST.   Multiple Vitamin (MULTIVITAMIN) capsule Take 1 capsule by mouth daily.   omeprazole (PRILOSEC) 40 MG capsule Take 1 capsule (40 mg total) by mouth daily.   No facility-administered encounter medications on file as of 01/21/2021.    Surgical History: Past Surgical History:  Procedure Laterality Date   CESAREAN SECTION     COLONOSCOPY     COLONOSCOPY WITH PROPOFOL N/A 01/04/2019   Procedure: COLONOSCOPY WITH PROPOFOL;  Surgeon: Jonathon Bellows, MD;  Location: [facility] ENDOSCOPY;  Service: Gastroenterology;  Laterality: N/A;   KNEE ARTHROSCOPY Left 11/18/2014   Procedure: ARTHROSCOPY KNEE;  Surgeon: Hessie Knows, MD;  Location: [facility] ORS;  Service: Orthopedics;  Laterality: Left;   partial medial menisectomy   THUMB FUSION Left 2014   TUBAL LIGATION     WISDOM TOOTH EXTRACTION      Medical History: Past Medical History:  Diagnosis Date   Anemia    Arthritis    Cancer ([place])    Chronic kidney disease    Heart murmur     Family History: Family History  Problem Relation Age of Onset   Heart disease Mother    Hyperlipidemia Mother    Diabetes Father    Cancer Father    Arthritis Sister    Anxiety disorder Sister    Cancer Brother    Arthritis Sister  Arthritis Sister    Breast cancer Neg Hx     Social History   Socioeconomic History   Marital status: Married    Spouse name: Not on file   Number of children: Not on file   Years of education: Not on file   Highest education level: Not on file  Occupational History   Not on file  Tobacco Use   Smoking status: Never   Smokeless tobacco: Never  Vaping Use   Vaping Use: Never used  Substance and Sexual Activity   Alcohol use: No   Drug use: No   Sexual activity: Not on file  Other Topics Concern   Not on file  Social History Narrative   Not on file   Social Determinants of Health   Financial Resource Strain: Not on file   Food Insecurity: Not on file  Transportation Needs: Not on file  Physical Activity: Not on file  Stress: Not on file  Social Connections: Not on file  Intimate Partner Violence: Not on file      Review of Systems  Constitutional:  Positive for fatigue. Negative for fever.  HENT:  Positive for congestion, postnasal drip and sore throat. Negative for mouth sores.   Respiratory:  Positive for cough. Negative for shortness of breath and wheezing.   Cardiovascular:  Negative for chest pain.  Genitourinary:  Negative for flank pain.  Psychiatric/Behavioral: Negative.     Vital Signs: Resp 16   Ht '5\' 8"'$  (1.727 m)   Wt 145 lb (65.8 kg)   BMI 22.05 kg/m    Observation/Objective:  Pt is able to carry out conversation.   Assessment/Plan: 1. Acute COVID-19 Will start on zpak and prednisone and quarantine. Patient may continue mucinex and flonase as well as tylenol prn. Edcuated to stay well hydrated and rest. - azithromycin (ZITHROMAX) 250 MG tablet; Take one tab a day for 10 days for uri  Dispense: 10 tablet; Refill: 0 - predniSONE (DELTASONE) 10 MG tablet; Take one tab 3 x day for 3 days, then take one tab 2 x a day for 3 days and then take one tab a day for 3 days for copd  Dispense: 18 tablet; Refill: 0   General Counseling: aamira jasko understanding of the findings of today's phone visit and agrees with plan of treatment. I have discussed any further diagnostic evaluation that may be needed or ordered today. We also reviewed her medications today. she has been encouraged to call the office with any questions or concerns that should arise related to todays visit.    No orders of the defined types were placed in this encounter.   Meds ordered this encounter  Medications   DISCONTD: nirmatrelvir/ritonavir EUA (PAXLOVID) TABS    Sig: Take 3 tablets by mouth 2 (two) times daily for 5 days.    Dispense:  30 tablet    Refill:  0   azithromycin (ZITHROMAX) 250 MG tablet     Sig: Take one tab a day for 10 days for uri    Dispense:  10 tablet    Refill:  0   predniSONE (DELTASONE) 10 MG tablet    Sig: Take one tab 3 x day for 3 days, then take one tab 2 x a day for 3 days and then take one tab a day for 3 days for copd    Dispense:  18 tablet    Refill:  0    Time spent:25 Minutes    Dr Lavera Guise  Internal medicine

## 2021-01-22 ENCOUNTER — Telehealth: Payer: Self-pay

## 2021-01-22 NOTE — Telephone Encounter (Signed)
Patient called stating she was told not to take all 3 meds. She stated her kids picked all 3 of them. She does not remember which ones she was told not to take. She would like call back-Toni

## 2021-01-22 NOTE — Telephone Encounter (Signed)
Spoke with pt as per lauren hold for paxlovid and take Zithromax and prednisone or take Paxlovid and hold for other 2 med but pt like to hold on Paxlovid and take Zithromax and prednisone

## 2021-02-02 ENCOUNTER — Telehealth: Payer: Self-pay

## 2021-02-02 NOTE — Telephone Encounter (Signed)
Neurology referral unsuccessfully sent through [facility]. Manually faxed to [facility] neurology-Toni

## 2021-02-23 ENCOUNTER — Other Ambulatory Visit: Payer: Self-pay | Admitting: Physician Assistant

## 2021-02-23 ENCOUNTER — Telehealth: Payer: Self-pay

## 2021-02-23 DIAGNOSIS — K649 Unspecified hemorrhoids: Secondary | ICD-10-CM

## 2021-02-23 MED ORDER — HYDROCORTISONE ACETATE 25 MG RE SUPP
RECTAL | 0 refills | Status: DC
Start: 2021-02-23 — End: 2021-08-27

## 2021-02-23 NOTE — Progress Notes (Signed)
Formatting of this note is different from the original.  SUBJECTIVE:   ACTIVE PROBLEMS:  There are no problems to display for this patient.    CHIEF COMPLAINT:  Chief Complaint   Patient presents with   ? Left Shoulder - Pain     SOCIAL HISTORY:  Social History     Socioeconomic History   ? Marital status: Married     Spouse name: Not on file   ? Number of children: Not on file   ? Years of education: Not on file   ? Highest education level: Not on file   Occupational History   ? Not on file   Tobacco Use   ? Smoking status: Never Smoker   ? Smokeless tobacco: Never Used   Substance and Sexual Activity   ? Alcohol use: Not on file   ? Drug use: Not on file   ? Sexual activity: Not on file   Other Topics Concern   ? Not on file   Social History Narrative   ? Not on file     Social Determinants of Health     Financial Resource Strain: Not on file   Food Insecurity: Not on file   Transportation Needs: Not on file   Physical Activity: Not on file   Stress: Not on file   Social Connections: Not on file   Intimate Partner Violence: Not on file   Housing Stability: Not on file     CURRENT MEDICATION:  Current Outpatient Medications   Medication Sig Dispense Refill   ? OTHER MEDICATION Advil, multivitamin, vitamin D, vitamin C, zinc, probiotic, creatine, vyvanse, metformin for PCOS, escitalopram, lorazepam       No current facility-administered medications for this visit.     ALLERGIES:  Not on File    HPI:    Patient is a 50 year old very active right-hand-dominant female who presents with left shoulder pain and weakness.  She states that the pain began earlier this year possibly in April.  She works out a lot in lives as well as boxes.  She states that she recalls being on an incline press where she had pain in her left shoulder and noted weakness.  She sought treatment with [facility] orthopedics.  I have the notes from their office from May of this year.  She was evaluated and sent for injection into the shoulder.  She  states that the injection helped tremendously giving her almost 100% pain relief for 6 weeks.  She then noticed continued pain in weakness.  She was reevaluated on June 2022 and an MRI was ordered.  She had an MR arthrogram of the left shoulder.  I reviewed the images personally and interpreted myself.  I agree with report that it shows a large full-thickness tear of the supraspinatus tendon with noted supraspinatus and infraspinatus tendinosis.  Mild to moderate AC joint arthritis.  Age-indeterminate superior labral tear from 10:00 to 1:00.  The rotator cuff tear measures approximately 1.6 cm anterior to posterior with 2 cm of retraction.  Biceps tendon appears to be intact.    OBJECTIVE:   Ht 1.575 m (5\' 2" )   Wt 64 kg (141 lb)   BMI 25.79 kg/m    Body mass index is 25.79 kg/m.     ROS:  Const:  Negative for Fever, Chills, Fatigue and Weight change  Eyes: Negative for Vision change, Double Vision, Blurred Vision  Ent: Negative  Resp: Negative for SOB, Cough  Card: Negative for Chest Pain  GI:  Negative for Abd pain, N, V and Heartburn  MS: Left shoulder pain  H-L:  Negative for Bleeding, Bruising and Enlarged nodes  Endo:  Negative forThirst, Cold/heat intolerance and Night sweats  Skin:  Negative for Skin rash, Lesions, Ulcers and Itching  Neuro: Negative  Psyc: Negative    PHYSICAL EXAM:    Vital Signs:    Visit Vitals  Ht 1.575 m (5\' 2" )   Wt 64 kg (141 lb)   BMI 25.79 kg/m   Smoking Status Never Smoker   BSA 1.67 m     Constitutional:  No acute distress. Body mass index is 25.79 kg/m.  HENT:  Normocephalic, Atraumatic,Gross auditory acuity intact, Bilateral external ears normal,  Nose normal.   Neck: Neck supple with no significant pain with range of motion. Negative Spurling test.  Eyes:  Gross Visual acuity is normal, Conjunctiva normal, No discharge.   Cardiovascular: Peripheral pulses 2+. No evidence of peripheral edema.  Skin: No ecchymosis or open wound. No erythema.  Respiratory:  Respirations  easy and unlabored. No audible wheezing or cough.  Neurologic:  Alert & oriented x 3, Normal motor function, Normal sensory function, No focal deficits noted.   Lymphatics:  No palpable lymphadenopathy in the upper extremity.  Musculoskeletal: Left shoulder nontender over the [facility] joint.  Slight tenderness along the greater tuberosity and proximal biceps.  Forward elevation, external rotation and internal rotation equal to the opposite side with forward elevation to 160 degrees, external rotation 45 degrees, internal rotation L3.  Some pain with noted weakness on the left side with empty can sign.  Slight pain with O'Brien test.  Negative lift off test.  Negative belly press test.      IMAGING:  XR MISCELLANEOUS OUTSIDE FILMS    Result Date: 02/23/2021  This exam is non-reportable and has been placed in the imaging system for conveyance of charges and/or patient tracking and may or may not be resulted on elsewhere in the [facility] Epic system.      Assessment   ASSESSMENT & PLAN:       ICD-10-CM ICD-9-CM    1. Complete tear of left rotator cuff, unspecified whether traumatic  M75.122 727.61    2. Tear of left glenoid labrum, initial encounter  S43.432A 840.8      Visit Disposition     Dispositions     Return if symptoms worsen or fail to improve.        Weight management and counseling:  Estimated body mass index is 25.79 kg/m as calculated from the following:    Height as of this encounter: 1.575 m (5\' 2" ).    Weight as of this encounter: 64 kg (141 lb)..    CMS Normal Parameters: Age 52 years and older BMI =>18.5 and <25kg/m2  This was discussed with her.  Body mass index is 25.79 kg/m.: in the acceptable range.     MEDICAL DECISION MAKING:    I had a long discussion with the patient about the natural history of rotator cuff tears how they we will stay the same size or can progress and get bigger with time.  Also the chance of atrophy of the rotator cuff musculature with this tear.  She is very active and understands  that her tear most likely will get bigger with time if she continues to stress it.  We talked about performing a left shoulder arthroscopic rotator cuff repair with labral debridement.  She understands that the main goal of the surgery  is for pain relief and to hopefully allow the tendon to heal and prevent further progression of the tear as well as possible atrophy of the musculature.  She understands the postoperative rehabilitation.  Patient has children that need special care and she states that she would have to discuss surgery in the situation with her family prior to any surgery.  In the meantime she is working with a physical therapist at the gym in [place] [employer] to keep her shoulder strong, stable and she is going to try to avoid activities that may cause increasing pain and tearing of the tendon.    Greater than 45 m was spent in the comprehensive care of this patient including but not limited reviewing imaging and medical records and examining and discussing with patient and coordinating care and documenting in the medical record      The electronic medical record was reviewed and updated as indicated via the "Loraine Leriche as Reviewed" time-stamps.  Electronically signed by Candice Camp., MD at 02/23/2021 11:42 AM EDT

## 2021-02-23 NOTE — Nursing Note (Signed)
Formatting of this note might be different from the original.  Verbal order per Dr. Beckie Busing for Injections, X-Rays, Diagnostic Testing, Medications, Therapy, and Referrals/Consults, on 02/23/21 at 9:07 AM    Bonnie Mitchell  is a 50 year old  year old female with right hand dominance who presents today for left shoulder pain.  Today, Bonnie Mitchell states the new problem began a couple months ago without specific injury. The pain is Constant and described as sore. Course of symptoms since initial onset has remained the same with brief improvement after a cortisone injection. Symptoms are aggravated by incline bench pressing at the gym. Treatments to date include cortisone injection and home exercise program.     Electronically signed by Olin Hauser, AT at 02/23/2021  9:09 AM EDT

## 2021-02-24 DIAGNOSIS — L814 Other melanin hyperpigmentation: Secondary | ICD-10-CM | POA: Diagnosis not present

## 2021-02-24 DIAGNOSIS — L578 Other skin changes due to chronic exposure to nonionizing radiation: Secondary | ICD-10-CM | POA: Diagnosis not present

## 2021-02-24 DIAGNOSIS — Z85828 Personal history of other malignant neoplasm of skin: Secondary | ICD-10-CM | POA: Diagnosis not present

## 2021-02-24 DIAGNOSIS — D040 Carcinoma in situ of skin of lip: Secondary | ICD-10-CM | POA: Diagnosis not present

## 2021-02-26 NOTE — Telephone Encounter (Signed)
Med sent to pharmacy.

## 2021-02-26 NOTE — Telephone Encounter (Signed)
error 

## 2021-03-03 ENCOUNTER — Encounter: Payer: Self-pay | Admitting: Obstetrics and Gynecology

## 2021-03-03 ENCOUNTER — Other Ambulatory Visit: Payer: Self-pay

## 2021-03-03 ENCOUNTER — Ambulatory Visit: Payer: BLUE CROSS/BLUE SHIELD | Admitting: Obstetrics and Gynecology

## 2021-03-03 VITALS — BP 130/78 | HR 71 | Ht 68.0 in | Wt 148.4 lb

## 2021-03-03 DIAGNOSIS — R102 Pelvic and perineal pain: Secondary | ICD-10-CM

## 2021-03-03 NOTE — Progress Notes (Signed)
HPI:      [name] is a 50 y.o. [id number] who LMP was Patient's last menstrual period was 02/23/2021 (exact date).  Subjective:   She presents today because she had acute onset of left-sided pelvic pain this morning.  She reports the pain was a 10 out of 10.  She assumed that it was the rupture of an ovarian cyst.  Since that time the pain has gradually resolved and she now reports the pain a 1 out of 10. She did not have any difficulty with bowel movements flank pain or with urination. Of significant note approximately 2 years ago patient was noted to have an ovarian cyst. In addition she has recently begun having hot flashes and 2 months ago had a test showing an elevated FSH consistent with menopause. Patient has an IUD in place [facility] occasionally has spotting but no significant menses.    Hx: The following portions of the patient's history were reviewed and updated as appropriate:             She  has a past medical history of Anemia, Arthritis, Cancer ([place]), Chronic kidney disease, and Heart murmur. She does not have any pertinent problems on file. She  has a past surgical history that includes Cesarean section; Tubal ligation; Colonoscopy; Wisdom tooth extraction; Thumb fusion (Left, 2014); Knee arthroscopy (Left, 11/18/2014); and Colonoscopy with propofol (N/A, 01/04/2019). Her family history includes Anxiety disorder in her sister; Arthritis in her sister, sister, and sister; Cancer in her brother and father; Diabetes in her father; Heart disease in her mother; Hyperlipidemia in her mother. She  reports that she has never smoked. She has never used smokeless tobacco. She reports that she does not drink alcohol and does not use drugs. She has a current medication list which includes the following prescription(s): acetaminophen, ergocalciferol, hydrocortisone, linzess, multivitamin, and omeprazole. She is allergic to oxycodone and tape.       Review of Systems:  Review of  Systems  Constitutional: Denied constitutional symptoms, night sweats, recent illness, fatigue, fever, insomnia and weight loss.  Eyes: Denied eye symptoms, eye pain, photophobia, vision change and visual disturbance.  Ears/Nose/Throat/Neck: Denied ear, nose, throat or neck symptoms, hearing loss, nasal discharge, sinus congestion and sore throat.  Cardiovascular: Denied cardiovascular symptoms, arrhythmia, chest pain/pressure, edema, exercise intolerance, orthopnea and palpitations.  Respiratory: Denied pulmonary symptoms, asthma, pleuritic pain, productive sputum, cough, dyspnea and wheezing.  Gastrointestinal: Denied, gastro-esophageal reflux, melena, nausea and vomiting.  Genitourinary: See HPI for additional information.  Musculoskeletal: Denied musculoskeletal symptoms, stiffness, swelling, muscle weakness and myalgia.  Dermatologic: Denied dermatology symptoms, rash and scar.  Neurologic: Denied neurology symptoms, dizziness, headache, neck pain and syncope.  Psychiatric: Denied psychiatric symptoms, anxiety and depression.  Endocrine: Denied endocrine symptoms including hot flashes and night sweats.   Meds:   Current Outpatient Medications on File Prior to Visit  Medication Sig Dispense Refill   acetaminophen (TYLENOL) 325 MG tablet Take 1 tablet by mouth.     ergocalciferol (DRISDOL) 1.25 MG (50000 UT) capsule Take 1 capsule (50,000 Units total) by mouth once a week. 4 capsule 5   hydrocortisone (ANUSOL-HC) 25 MG suppository Insert one after EACH bm QD 25 suppository 0   LINZESS 145 MCG CAPS capsule TAKE 1 CAPSULE BY MOUTH DAILY BEFORE BREAKFAST. 90 capsule 1   Multiple Vitamin (MULTIVITAMIN) capsule Take 1 capsule by mouth daily.     omeprazole (PRILOSEC) 40 MG capsule Take 1 capsule (40 mg total) by mouth daily. [place]  capsule 3   No current facility-administered medications on file prior to visit.      Objective:     Vitals:   03/03/21 1549  BP: 130/78  Pulse: 71    Filed Weights   03/03/21 1549  Weight: 148 lb 6.4 oz (67.3 kg)              Abdominal examination shows some pain with deep palpation-no dominant masses  No evidence of hernia and no pain in the area of hernia.          Assessment:    [id number] Patient Active Problem List   Diagnosis Date Noted   Varicose veins of both lower extremities with inflammation 12/11/2019   Pain in both lower extremities 11/05/2019   Baker's cyst of knee, unspecified laterality 11/05/2019   Irritable bowel syndrome with constipation 11/05/2019   Encounter for general adult medical examination with abnormal findings 08/19/2019   Chronic right shoulder pain 08/19/2019   Acute otitis media 11/19/2018   Screening for colon cancer 08/14/2018   Dysuria 08/14/2018   Vitamin D deficiency 08/14/2018   Excessive bleeding in premenopausal period 07/04/2018   Uterine leiomyoma 07/04/2018   Urinary tract infection with hematuria 04/05/2018   Acute non-recurrent sinusitis 04/05/2018   Acute low back pain 04/05/2018   Chest pain 01/17/2018   Generalized anxiety disorder 01/17/2018   Acute otitis externa of both ears 01/17/2018   Subacromial bursitis of right shoulder joint 12/15/2017   Pain in right hand 09/18/2017   Primary osteoarthritis of hands, bilateral 08/27/2017   Melanoma ([place]) 01/10/2017   History of anemia 01/17/2014   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 Pelvic pain in female     Although ovarian cyst is unlikely for a woman in menopause it is possible with an IUD in place and because she is newly menopausal perhaps she did form a small cyst that has ruptured.  Her history is a very good one that can be consistent with rupture of the ovarian cyst significant pelvic pain rapidly resolving.     Plan:            1.  Expectant management.  If pain returns -pelvic ultrasound .  I discussed the differential diagnosis in  detail with the patient and she understands the plan of care.  She will inform us immediately if the pain returns.  2.  She is to return in 1 month for annual examination and to discuss use of HRT in menopause..   We have previously discussed the usefulness of IUD for a woman in menopause.  I have informed her that Mirena now last 8 years. Orders No orders of the defined types were placed in this encounter.   No orders of the defined types were placed in this encounter.     F/U  Return in about 1 month (around 04/02/2021) for Annual Physical. I spent 22 minutes involved in the care of this patient preparing to see the patient by obtaining and reviewing her medical history (including labs, imaging tests and prior procedures), documenting clinical information in the electronic health record (EHR), counseling and coordinating care plans, writing and sending prescriptions, ordering tests or procedures and in direct communicating with the patient and medical staff discussing pertinent items from her history and physical exam.  Finis Bud, M.D. 03/03/2021 4:28 PM

## 2021-03-03 NOTE — Progress Notes (Signed)
Pt present due to having left side abd pain. Pt stated that this morning the pain level was a 10. Pt stated having ovarian cyst and think it may have ruptured.

## 2021-03-23 DIAGNOSIS — M778 Other enthesopathies, not elsewhere classified: Secondary | ICD-10-CM | POA: Diagnosis not present

## 2021-03-29 NOTE — Patient Instructions (Signed)
Breast Self-Awareness Breast self-awareness is knowing how your breasts look and feel. Doing breast self-awareness is important. It allows you to catch a breast problem early while it is still small and can be treated. All women should do breast self-awareness, including women who have had breast implants. Tell your doctor if you notice a change in your breasts. What you need: A mirror. A well-lit room. How to do a breast self-exam A breast self-exam is one way to learn what is normal for your breasts and to check for changes. To do a breast self-exam: Look for changes  Take off all the clothes above your waist. Stand in front of a mirror in a room with good lighting. Put your hands on your hips. Push your hands down. Look at your breasts and nipples in the mirror to see if one breast or nipple looks different from the other. Check to see if: The shape of one breast is different. The size of one breast is different. There are wrinkles, dips, and bumps in one breast and not the other. Look at each breast for changes in the skin, such as: Redness. Scaly areas. Look for changes in your nipples, such as: Liquid around the nipples. Bleeding. Dimpling. Redness. A change in where the nipples are. Feel for changes  Lie on your back on the floor. Feel each breast. To do this, follow these steps: Pick a breast to feel. Put the arm closest to that breast above your head. Use your other arm to feel the nipple area of your breast. Feel the area with the pads of your three middle fingers by making small circles with your fingers. For the first circle, press lightly. For the second circle, press harder. For the third circle, press even harder. Keep making circles with your fingers at the different pressures as you move down your breast. Stop when you feel your ribs. Move your fingers a little toward the center of your body. Start making circles with your fingers again, this time going up until  you reach your collarbone. Keep making up-and-down circles until you reach your armpit. Remember to keep using the three pressures. Feel the other breast in the same way. Sit or stand in the tub or shower. With soapy water on your skin, feel each breast the same way you did in step 2 when you were lying on the floor. Write down what you find Writing down what you find can help you remember what to tell your doctor. Write down: What is normal for each breast. Any changes you find in each breast, including: The kind of changes you find. Whether you have pain. Size and location of any lumps. When you last had your menstrual period. General tips Check your breasts every month. If you are breastfeeding, the best time to check your breasts is after you feed your baby or after you use a breast pump. If you get menstrual periods, the best time to check your breasts is 5-7 days after your menstrual period is over. With time, you will become comfortable with the self-exam, and you will begin to know if there are changes in your breasts. Contact a doctor if you: See a change in the shape or size of your breasts or nipples. See a change in the skin of your breast or nipples, such as red or scaly skin. Have fluid coming from your nipples that is not normal. Find a lump or thick area that was not there before. Have pain in   your breasts. Have any concerns about your breast health. Summary Breast self-awareness includes looking for changes in your breasts, as well as feeling for changes within your breasts. Breast self-awareness should be done in front of a mirror in a well-lit room. You should check your breasts every month. If you get menstrual periods, the best time to check your breasts is 5-7 days after your menstrual period is over. Let your doctor know of any changes you see in your breasts, including changes in size, changes on the skin, pain or tenderness, or fluid from your nipples that is not  normal. This information is not intended to replace advice given to you by your health care provider. Make sure you discuss any questions you have with your health care provider. Document Revised: 01/16/2018 Document Reviewed: 01/16/2018 Elsevier Patient Education  2022 [employer]    Preventive Care 40-64 Years Old, Female Preventive care refers to lifestyle choices and visits with your health care provider that can promote health and wellness. This includes: A yearly physical exam. This is also called an annual wellness visit. Regular dental and eye exams. Immunizations. Screening for certain conditions. Healthy lifestyle choices, such as: Eating a healthy diet. Getting regular exercise. Not using drugs or products that contain nicotine and tobacco. Limiting alcohol use. What can I expect for my preventive care visit? Physical exam Your health care provider will check your: Height and weight. These may be used to calculate your BMI (body mass index). BMI is a measurement that tells if you are at a healthy weight. Heart rate and blood pressure. Body temperature. Skin for abnormal spots. Counseling Your health care provider may ask you questions about your: Past medical problems. Family's medical history. Alcohol, tobacco, and drug use. Emotional well-being. Home life and relationship well-being. Sexual activity. Diet, exercise, and sleep habits. Work and work environment. Access to firearms. Method of birth control. Menstrual cycle. Pregnancy history. What immunizations do I need? Vaccines are usually given at various ages, according to a schedule. Your health care provider will recommend vaccines for you based on your age, medical history, and lifestyle or other factors, such as travel or where you work. What tests do I need? Blood tests Lipid and cholesterol levels. These may be checked every 5 years, or more often if you are over 50 years old. Hepatitis C  test. Hepatitis B test. Screening Lung cancer screening. You may have this screening every year starting at age 55 if you have a 30-pack-year history of smoking and currently smoke or have quit within the past 15 years. Colorectal cancer screening. All adults should have this screening starting at age 50 and continuing until age 75. Your health care provider may recommend screening at age 45 if you are at increased risk. You will have tests every 1-10 years, depending on your results and the type of screening test. Diabetes screening. This is done by checking your blood sugar (glucose) after you have not eaten for a while (fasting). You may have this done every 1-3 years. Mammogram. This may be done every 1-2 years. Talk with your health care provider about when you should start having regular mammograms. This may depend on whether you have a family history of breast cancer. BRCA-related cancer screening. This may be done if you have a family history of breast, ovarian, tubal, or peritoneal cancers. Pelvic exam and Pap test. This may be done every 3 years starting at age 21. Starting at age 30, this may be done every   5 years if you have a Pap test in combination with an HPV test. Other tests STD (sexually transmitted disease) testing, if you are at risk. Bone density scan. This is done to screen for osteoporosis. You may have this scan if you are at high risk for osteoporosis. Talk with your health care provider about your test results, treatment options, and if necessary, the need for more tests. Follow these instructions at home: Eating and drinking  Eat a diet that includes fresh fruits and vegetables, whole grains, lean protein, and low-fat dairy products. Take vitamin and mineral supplements as recommended by your health care provider. Do not drink alcohol if: Your health care provider tells you not to drink. You are pregnant, may be pregnant, or are planning to become pregnant. If  you drink alcohol: Limit how much you have to 0-1 drink a day. Be aware of how much alcohol is in your drink. In the [place] one drink equals one 12 oz bottle of beer (355 mL), one 5 oz glass of wine (148 mL), or one 1 oz glass of hard liquor (44 mL). Lifestyle Take daily care of your teeth and gums. Brush your teeth every morning and night with fluoride toothpaste. Floss one time each day. Stay active. Exercise for at least 30 minutes 5 or more days each week. Do not use any products that contain nicotine or tobacco, such as cigarettes, e-cigarettes, and chewing tobacco. If you need help quitting, ask your health care provider. Do not use drugs. If you are sexually active, practice safe sex. Use a condom or other form of protection to prevent STIs (sexually transmitted infections). If you do not wish to become pregnant, use a form of birth control. If you plan to become pregnant, see your health care provider for a prepregnancy visit. If told by your health care provider, take low-dose aspirin daily starting at age 50. Find healthy ways to cope with stress, such as: Meditation, yoga, or listening to music. Journaling. Talking to a trusted person. Spending time with friends and family. Safety Always wear your seat belt while driving or riding in a vehicle. Do not drive: If you have been drinking alcohol. Do not ride with someone who has been drinking. When you are tired or distracted. While texting. Wear a helmet and other protective equipment during sports activities. If you have firearms in your house, make sure you follow all gun safety procedures. What's next? Visit your health care provider once a year for an annual wellness visit. Ask your health care provider how often you should have your eyes and teeth checked. Stay up to date on all vaccines. This information is not intended to replace advice given to you by your health care provider. Make sure you discuss any questions you have  with your health care provider. Document Revised: 08/07/2020 Document Reviewed: 02/08/2018 Elsevier Patient Education  2022 [employer]vier Inc.  

## 2021-03-30 ENCOUNTER — Other Ambulatory Visit: Payer: Self-pay

## 2021-03-30 ENCOUNTER — Other Ambulatory Visit (HOSPITAL_COMMUNITY)
Admission: RE | Admit: 2021-03-30 | Discharge: 2021-03-30 | Disposition: A | Discharge status: Home / Self Care | Payer: BLUE CROSS/BLUE SHIELD | Source: Ambulatory Visit | Attending: Obstetrics and Gynecology | Admitting: Obstetrics and Gynecology

## 2021-03-30 ENCOUNTER — Ambulatory Visit (INDEPENDENT_AMBULATORY_CARE_PROVIDER_SITE_OTHER): Payer: BLUE CROSS/BLUE SHIELD | Admitting: Obstetrics and Gynecology

## 2021-03-30 ENCOUNTER — Encounter: Payer: Self-pay | Admitting: Obstetrics and Gynecology

## 2021-03-30 VITALS — BP 136/92 | HR 70 | Ht 68.0 in | Wt 148.0 lb

## 2021-03-30 DIAGNOSIS — Z124 Encounter for screening for malignant neoplasm of cervix: Secondary | ICD-10-CM

## 2021-03-30 DIAGNOSIS — Z01419 Encounter for gynecological examination (general) (routine) without abnormal findings: Secondary | ICD-10-CM | POA: Diagnosis not present

## 2021-03-30 DIAGNOSIS — Z79890 Hormone replacement therapy (postmenopausal): Secondary | ICD-10-CM

## 2021-03-30 DIAGNOSIS — Z1231 Encounter for screening mammogram for malignant neoplasm of breast: Secondary | ICD-10-CM

## 2021-03-30 MED ORDER — ESTRADIOL 1 MG PO TABS: 1.0000 mg | ORAL_TABLET | Freq: Every day | ORAL | 3 refills | Status: DC

## 2021-03-30 NOTE — Addendum Note (Signed)
Addended by: Edwyna Shell on: 03/30/2021 11:03 AM   Modules accepted: Orders

## 2021-03-30 NOTE — Progress Notes (Signed)
HPI:      [name] is a 50 y.o. [phone] who LMP was No LMP recorded. (Menstrual status: IUD).  Subjective:   She presents today for her annual examination.  She has no specific complaints.  She does state that last night she had a short episode of pelvic pain but this quickly resolved.  She is not having any bleeding.  She states that she has occasional hot flashes but not regularly.  She says her memory is declining a little. Would like to discuss HRT today.  Patient previously tested and found to be menopausal by elevated FSH. Has Mirena IUD in place.    Hx: The following portions of the patient's history were reviewed and updated as appropriate:             She  has a past medical history of Anemia, Arthritis, Cancer ([place]), Chronic kidney disease, and Heart murmur. She does not have any pertinent problems on file. She  has a past surgical history that includes Cesarean section; Tubal ligation; Colonoscopy; Wisdom tooth extraction; Thumb fusion (Left, 2014); Knee arthroscopy (Left, 11/18/2014); and Colonoscopy with propofol (N/A, 01/04/2019). Her family history includes Anxiety disorder in her sister; Arthritis in her sister, sister, and sister; Cancer in her brother and father; Diabetes in her father; Heart disease in her mother; Hyperlipidemia in her mother. She  reports that she has never smoked. She has never used smokeless tobacco. She reports that she does not drink alcohol and does not use drugs. She has a current medication list which includes the following prescription(s): acetaminophen, ergocalciferol, estradiol, hydrocortisone, linzess, multivitamin, and omeprazole. She is allergic to oxycodone and tape.       Review of Systems:  Review of Systems  Constitutional: Denied constitutional symptoms, night sweats, recent illness, fatigue, fever, insomnia and weight loss.  Eyes: Denied eye symptoms, eye pain, photophobia, vision change and visual disturbance.   Ears/Nose/Throat/Neck: Denied ear, nose, throat or neck symptoms, hearing loss, nasal discharge, sinus congestion and sore throat.  Cardiovascular: Denied cardiovascular symptoms, arrhythmia, chest pain/pressure, edema, exercise intolerance, orthopnea and palpitations.  Respiratory: Denied pulmonary symptoms, asthma, pleuritic pain, productive sputum, cough, dyspnea and wheezing.  Gastrointestinal: Denied, gastro-esophageal reflux, melena, nausea and vomiting.  Genitourinary: Denied genitourinary symptoms including symptomatic vaginal discharge, pelvic relaxation issues, and urinary complaints.  Musculoskeletal: Denied musculoskeletal symptoms, stiffness, swelling, muscle weakness and myalgia.  Dermatologic: Denied dermatology symptoms, rash and scar.  Neurologic: Denied neurology symptoms, dizziness, headache, neck pain and syncope.  Psychiatric: Denied psychiatric symptoms, anxiety and depression.  Endocrine: See HPI for additional information.   Meds:   Current Outpatient Medications on File Prior to Visit  Medication Sig Dispense Refill   acetaminophen (TYLENOL) 325 MG tablet Take 1 tablet by mouth.     ergocalciferol (DRISDOL) 1.25 MG (50000 UT) capsule Take 1 capsule (50,000 Units total) by mouth once a week. 4 capsule 5   hydrocortisone (ANUSOL-HC) 25 MG suppository Insert one after EACH bm QD 25 suppository 0   LINZESS 145 MCG CAPS capsule TAKE 1 CAPSULE BY MOUTH DAILY BEFORE BREAKFAST. 90 capsule 1   Multiple Vitamin (MULTIVITAMIN) capsule Take 1 capsule by mouth daily.     omeprazole (PRILOSEC) 40 MG capsule Take 1 capsule (40 mg total) by mouth daily. 30 capsule 3   No current facility-administered medications on file prior to visit.      Objective:     Vitals:   03/30/21 0736  BP: (!) 136/92  Pulse: 70  Filed Weights   03/30/21 0736  Weight: 148 lb (67.1 kg)              Physical examination General NAD, Conversant  HEENT Atraumatic; Op clear with mmm.   Normo-cephalic. Pupils reactive. Anicteric sclerae  Thyroid/Neck Smooth without nodularity or enlargement. Normal ROM.  Neck Supple.  Skin No rashes, lesions or ulceration. Normal palpated skin turgor. No nodularity.  Breasts: No masses or discharge.  Symmetric.  No axillary adenopathy.  Lungs: Clear to auscultation.No rales or wheezes. Normal Respiratory effort, no retractions.  Heart: NSR.  No murmurs or rubs appreciated. No periferal edema  Abdomen: Soft.  Non-tender.  No masses.  No HSM. No hernia  Extremities: Moves all appropriately.  Normal ROM for age. No lymphadenopathy.  Neuro: Oriented to PPT.  Normal mood. Normal affect.     Pelvic:   Vulva: Normal appearance.  No lesions.  Vagina: No lesions or abnormalities noted.  Support: Normal pelvic support.  Urethra No masses tenderness or scarring.  Meatus Normal size without lesions or prolapse.  Cervix: Normal appearance.  No lesions.  Anus: Normal exam.  No lesions.  Perineum: Normal exam.  No lesions.        Bimanual   Uterus: Normal size.  Non-tender.  Mobile.  AV.  Adnexae: No masses.  Non-tender to palpation.  Cul-de-sac: Negative for abnormality.     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/11/2019   Pain in both lower extremities 11/05/2019   Baker's cyst of knee, unspecified laterality 11/05/2019   Irritable bowel syndrome with constipation 11/05/2019   Encounter for general adult medical examination with abnormal findings 08/19/2019   Chronic right shoulder pain 08/19/2019   Acute otitis media 11/19/2018   Screening for colon cancer 08/14/2018   Dysuria 08/14/2018   Vitamin D deficiency 08/14/2018   Excessive bleeding in premenopausal period 07/04/2018   Uterine leiomyoma 07/04/2018   Urinary tract infection with hematuria 04/05/2018   Acute non-recurrent sinusitis 04/05/2018   Acute low back pain 04/05/2018    Chest pain 01/17/2018   Generalized anxiety disorder 01/17/2018   Acute otitis externa of both ears 01/17/2018   Subacromial bursitis of right shoulder joint 12/15/2017   Pain in right hand 09/18/2017   Primary osteoarthritis of hands, bilateral 08/27/2017   Melanoma ([place]) 01/10/2017   History of anemia 01/17/2014   Morton's neuroma 01/17/2014   Multiple renal cysts 01/17/2014   Psoriatic arthritis ([place]) 01/17/2014     1. Well woman exam with routine gynecological exam   2. Postmenopausal hormone therapy        Plan:            1.  Basic Screening Recommendations The basic screening recommendations for asymptomatic women were discussed with the patient during her visit.  The age-appropriate recommendations were discussed with her and the rational for the tests reviewed.  When I am informed by the patient that another primary care physician has previously obtained the age-appropriate tests and they are up-to-date, only outstanding tests are ordered and referrals given as necessary.  Abnormal results of tests will be discussed with her when all of her results are completed.  Routine preventative health maintenance measures emphasized: Exercise/Diet/Weight control, Tobacco Warnings, Alcohol/Substance use risks and Stress Management Pap Co-test performed -patient has mammogram scheduled for next week -does blood work through work. 2.  HRT I have discussed HRT with the  patient in detail.  The risk/benefits of it were reviewed.  She understands that during menopause Estrogen decreases dramatically and that this results in an increased risk of cardiovascular disease as well as osteoporosis.  We have also discussed the fact that hot flashes often result from a decrease in Estrogen, and that by replacing Estrogen, they can often be alleviated.  We have discussed skin, vaginal and urinary tract changes that may also take place from this drop in Estrogen.  Emotional changes have also been linked to  Estrogen and we have briefly discussed this.  The benefits of HRT including decrease in hot flashes, vaginal dryness, and osteoporosis were discussed.  The emotional benefit and a possible change in her cardiovascular risk profile was also reviewed.  The risks associated with Hormone Replacement Therapy were also reviewed.  The use of unopposed Estrogen and its relationship to endometrial cancer was discussed.  The addition of Progesterone and its beneficial effect on endometrial cancer was also noted.  The fact that there has been no consistent definitive studies showing an increase in breast cancer in women who use HRT was discussed with the patient.  The possible side effects including breast tenderness, fluid retention, mood changes and vaginal bleeding were discussed.  The patient was informed that this is an elective medication and that she may choose not to take Hormone Replacement Therapy.  Literature on HRT was made available, and I believe that after answering all of the patient's questions.  Special emphasis on the WHI study, as well as several studies since that pertaining to the risks and benefits of estrogen replacement therapy were compared.  The possible limitations of these studies were discussed including the age stratification of the WHI study.  The possible increased role of Progesterone in these studies was discussed in detail.  Different types of hormone formulation and methods of taking hormone replacement therapy were discussed. Literature on HRT was made available, and I believe that after answering all of the patient's questions she has an adequate and informed understanding of the risks and benefits of HRT. Patient has decided she would like to begin Estrace. Orders No orders of the defined types were placed in this encounter.    Meds ordered this encounter  Medications   estradiol (ESTRACE) 1 MG tablet    Sig: Take 1 tablet (1 mg total) by mouth daily.    Dispense:  90 tablet     Refill:  3          F/U  Return in about 3 months (around 06/30/2021). I spent 14 additional minutes involved in the care of this patient discussing HRT risks and benefits.  In addition I spent time preparing to see the patient by obtaining and reviewing her medical history (including labs, imaging tests and prior procedures), documenting clinical information in the electronic health record (EHR), counseling and coordinating care plans, writing and sending prescriptions, ordering tests or procedures and in direct communicating with the patient and medical staff discussing pertinent items from her history and physical exam.  Finis Bud, M.D. 03/30/2021 8:09 AM

## 2021-03-30 NOTE — Progress Notes (Signed)
Pt present for annual exam. Pt stated that she would be getting her mammogram completed at her job. Pt declined flu vaccine.

## 2021-04-01 DIAGNOSIS — Z23 Encounter for immunization: Secondary | ICD-10-CM | POA: Diagnosis not present

## 2021-04-06 LAB — CYTOLOGY - PAP
Comment: NEGATIVE
Diagnosis: UNDETERMINED — AB
High risk HPV: NEGATIVE

## 2021-04-06 NOTE — Progress Notes (Signed)
This can be considered a negative Pap smear because her HPV is negative.  Just to be on the safe side I would consider another Pap next year.  There is nothing further for you to do.

## 2021-04-08 DIAGNOSIS — D2271 Melanocytic nevi of right lower limb, including hip: Secondary | ICD-10-CM | POA: Diagnosis not present

## 2021-04-08 DIAGNOSIS — D2261 Melanocytic nevi of right upper limb, including shoulder: Secondary | ICD-10-CM | POA: Diagnosis not present

## 2021-04-08 DIAGNOSIS — D2262 Melanocytic nevi of left upper limb, including shoulder: Secondary | ICD-10-CM | POA: Diagnosis not present

## 2021-04-08 DIAGNOSIS — Z86006 Personal history of melanoma in-situ: Secondary | ICD-10-CM | POA: Diagnosis not present

## 2021-04-19 DIAGNOSIS — M7701 Medial epicondylitis, right elbow: Secondary | ICD-10-CM | POA: Diagnosis not present

## 2021-04-22 IMAGING — MG DIGITAL SCREENING BILATERAL MAMMOGRAM WITH TOMO AND CAD
8 series · 9 of 24 positions shown · non-contrast
Comparison: Previous exam(s).

CLINICAL DATA: Screening.

EXAM:
DIGITAL SCREENING BILATERAL MAMMOGRAM WITH TOMO AND CAD

[L CC synth-2D]
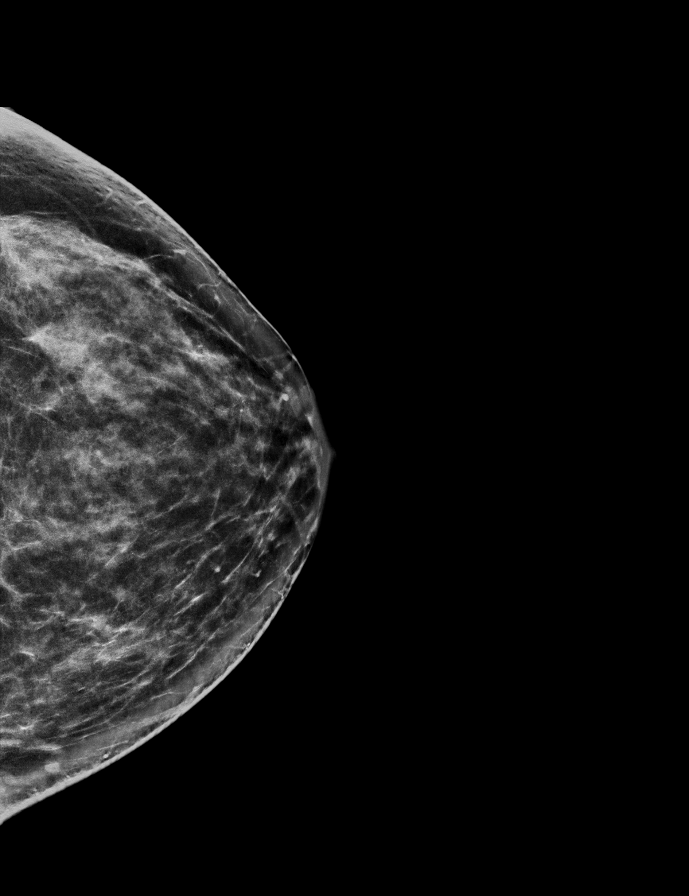

[R CC synth-2D]
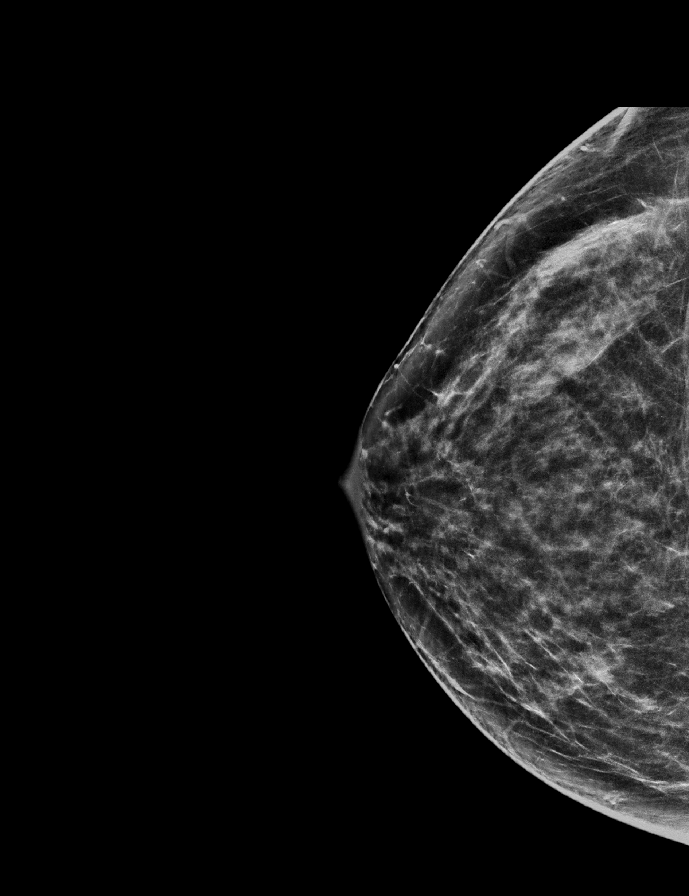

[R MLO synth-2D]
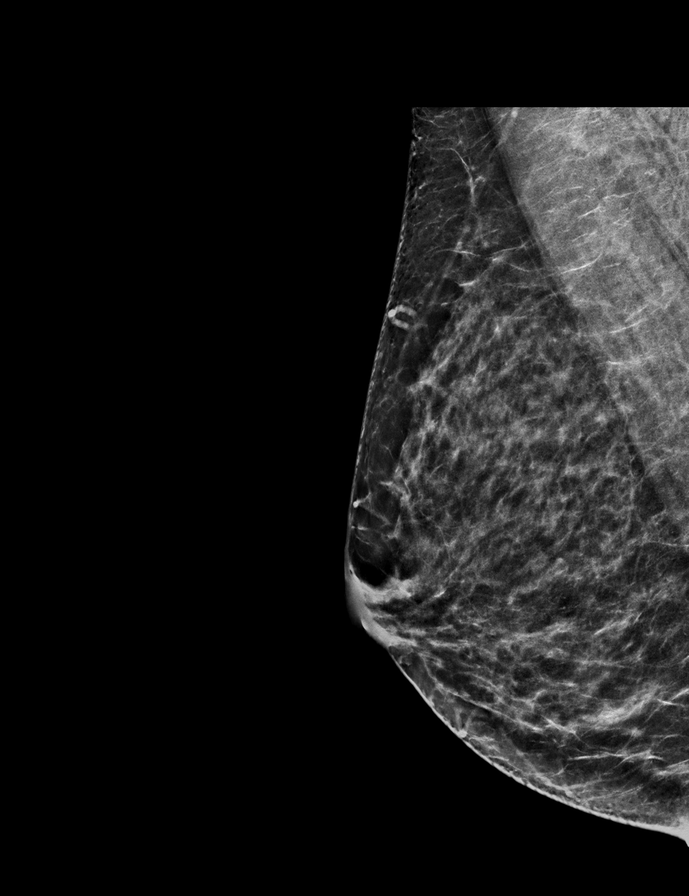

[L MLO synth-2D]
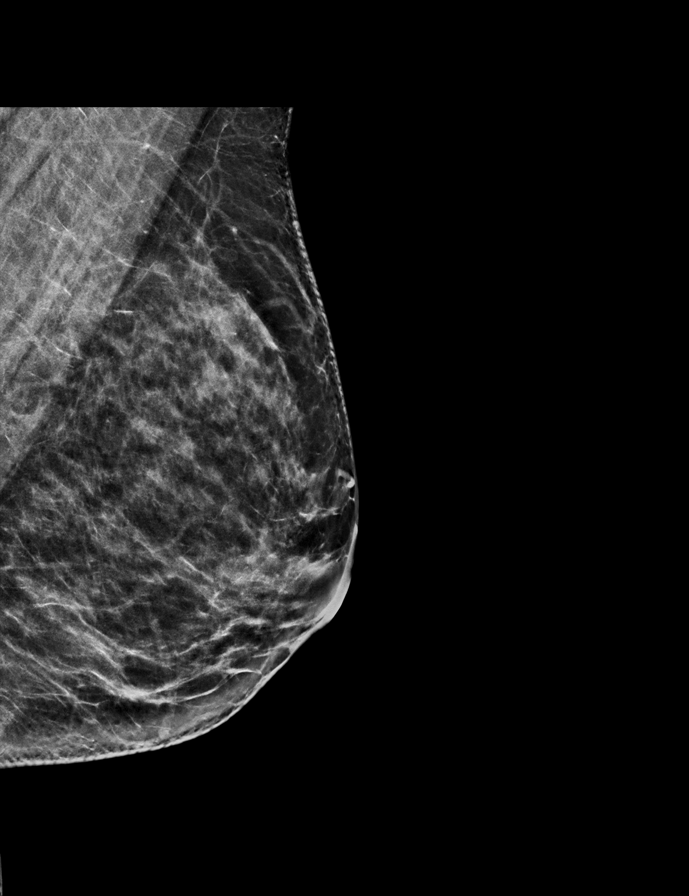

[R CC tomo · 2 of 69 frames shown]
[frame 23/69]
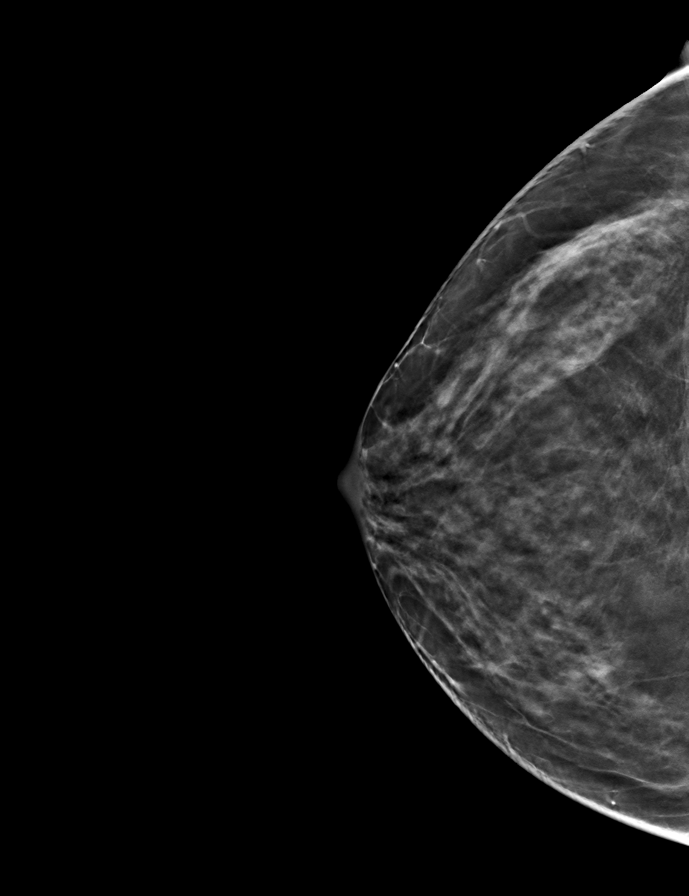
[frame 35/69]
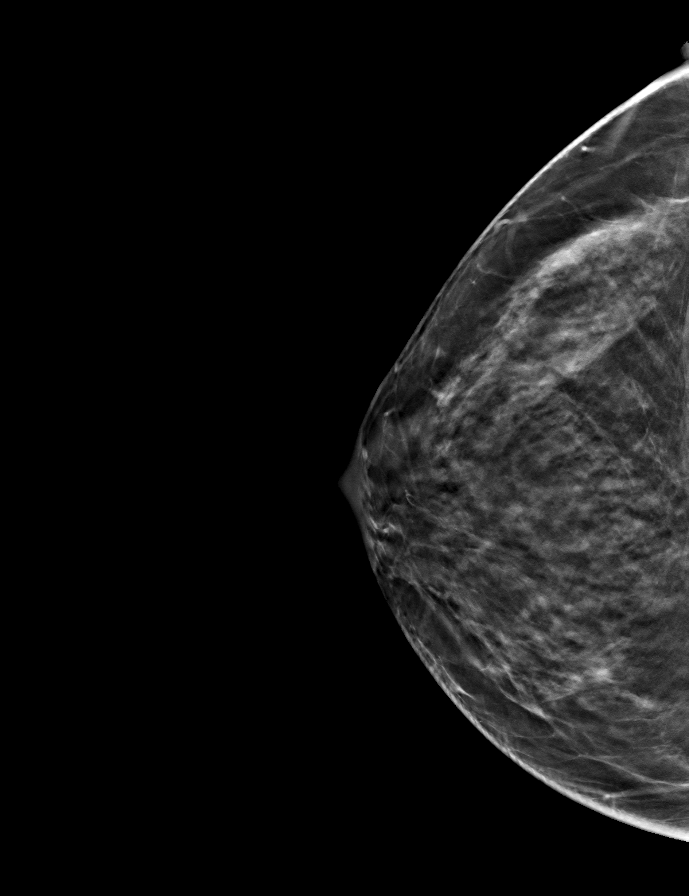

[L MLO tomo · tomo slice 33/65.0]
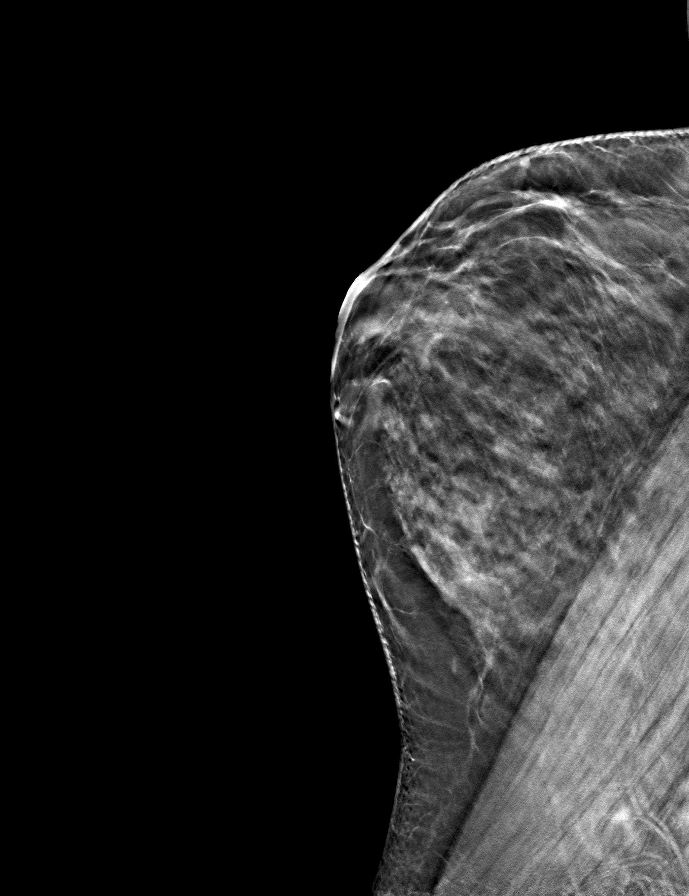

[R MLO tomo · tomo slice 33/66.0]
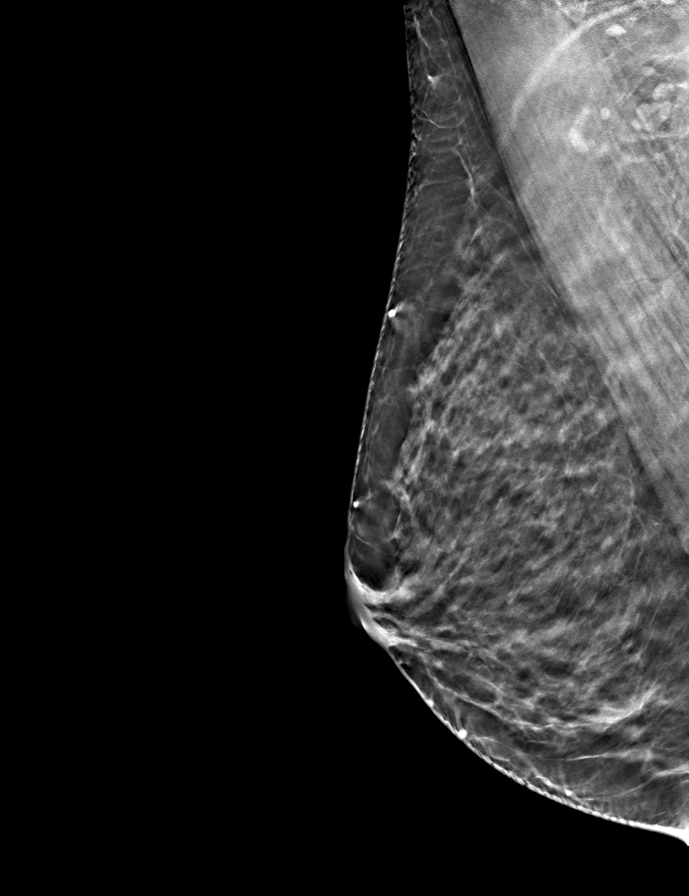

[L CC tomo · tomo slice 35/69.0]
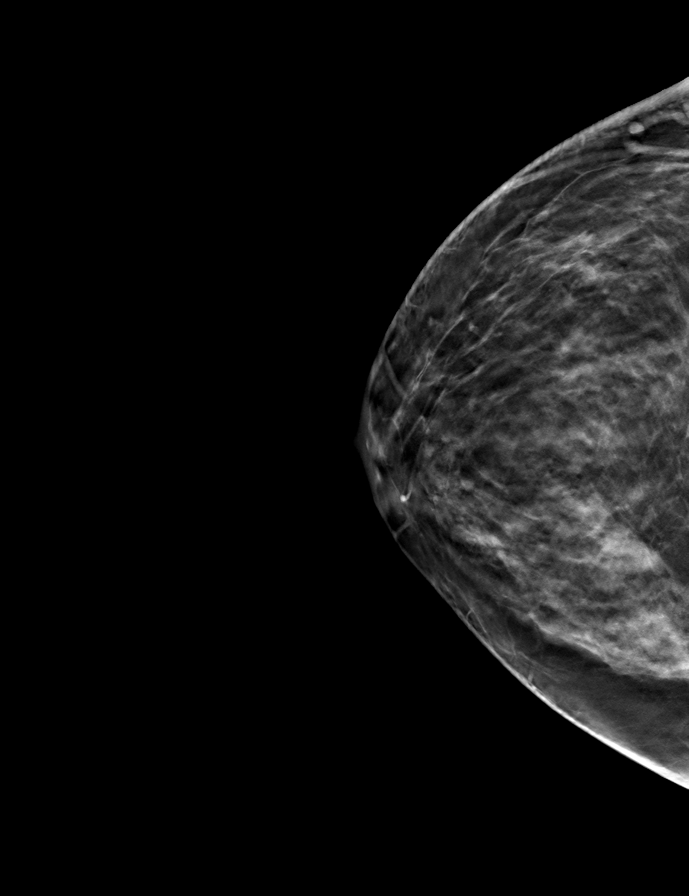

[9 of 24 positions shown; findings below may reference images not displayed]

ACR Breast Density Category c: The breast tissue is heterogeneously
dense, which may obscure small masses.
FINDINGS: There are no findings suspicious for malignancy. Images were
processed with CAD.
IMPRESSION: No mammographic evidence of malignancy. A result letter of this
screening mammogram will be mailed directly to the patient.

RECOMMENDATION:
Screening mammogram in one year. (Code:[id number]-U-LHB)

BI-RADS CATEGORY  1: Negative.

## 2021-05-14 DIAGNOSIS — M25511 Pain in right shoulder: Secondary | ICD-10-CM | POA: Diagnosis not present

## 2021-05-20 ENCOUNTER — Other Ambulatory Visit: Payer: Self-pay | Admitting: Internal Medicine

## 2021-05-20 DIAGNOSIS — K581 Irritable bowel syndrome with constipation: Secondary | ICD-10-CM

## 2021-05-24 DIAGNOSIS — M25511 Pain in right shoulder: Secondary | ICD-10-CM | POA: Diagnosis not present

## 2021-05-25 ENCOUNTER — Other Ambulatory Visit: Payer: Self-pay | Admitting: Internal Medicine

## 2021-05-25 DIAGNOSIS — E559 Vitamin D deficiency, unspecified: Secondary | ICD-10-CM

## 2021-05-28 DIAGNOSIS — Z1231 Encounter for screening mammogram for malignant neoplasm of breast: Secondary | ICD-10-CM | POA: Diagnosis not present

## 2021-06-17 DIAGNOSIS — M25511 Pain in right shoulder: Secondary | ICD-10-CM | POA: Diagnosis not present

## 2021-06-30 ENCOUNTER — Encounter: Payer: Self-pay | Admitting: Obstetrics and Gynecology

## 2021-06-30 ENCOUNTER — Telehealth (INDEPENDENT_AMBULATORY_CARE_PROVIDER_SITE_OTHER): Payer: BLUE CROSS/BLUE SHIELD | Admitting: Obstetrics and Gynecology

## 2021-06-30 DIAGNOSIS — Z79890 Hormone replacement therapy (postmenopausal): Secondary | ICD-10-CM | POA: Diagnosis not present

## 2021-06-30 NOTE — Progress Notes (Signed)
Virtual Visit via Video Note  I connected with Judith Gomez on 06/30/21 at  7:30 AM EST by video and verified that I was speaking with the correct person using two identifiers.    [name] is a 51 y.o. [phone] who LMP was No LMP recorded. (Menstrual status: IUD). I discussed the limitations, risks, security and privacy concerns of performing an evaluation and management service by video and the availability of in person appointments. I also discussed with the patient that there may be a patient responsible charge related to this service. The patient expressed understanding and agreed to proceed.  Location of patient:  [place]  Patient gave explicit verbal consent for video visit:  YES  Location of provider:  [facility] office  Persons other than physician and patient involved in provider conference:  None   Subjective:   History of Present Illness:    She was previously found to be in menopause by elevated [place].  She has an IUD in place [place]).  And 3 months ago she began ERT. She now reports that her hot flashes have resolved and that she is "sleeping better".  Other than that she cannot tell any difference.  She is happy with the ERT would like to continue. She does report that she is having some difficulty with her shoulder and calcium spurs/deposits.  She is seeing [facility] for this issue.  Hx: The following portions of the patient's history were reviewed and updated as appropriate:             She  has a past medical history of Anemia, Arthritis, Cancer ([place]), Chronic kidney disease, and Heart murmur. She does not have any pertinent problems on file. She  has a past surgical history that includes Cesarean section; Tubal ligation; Colonoscopy; Wisdom tooth extraction; Thumb fusion (Left, 2014); Knee arthroscopy (Left, 11/18/2014); and Colonoscopy with propofol (N/A, 01/04/2019). Her family history includes Anxiety disorder in her sister; Arthritis in her sister, sister, and sister;  Cancer in her brother and father; Diabetes in her father; Heart disease in her mother; Hyperlipidemia in her mother. She  reports that she has never smoked. She has never used smokeless tobacco. She reports that she does not drink alcohol and does not use drugs. She has a current medication list which includes the following prescription(s): acetaminophen, estradiol, hydrocortisone, linzess, multivitamin, omeprazole, and vitamin d (ergocalciferol). She is allergic to oxycodone and tape.       Review of Systems:  Review of Systems  Constitutional: Denied constitutional symptoms, night sweats, recent illness, fatigue, fever, insomnia and weight loss.  Eyes: Denied eye symptoms, eye pain, photophobia, vision change and visual disturbance.  Ears/Nose/Throat/Neck: Denied ear, nose, throat or neck symptoms, hearing loss, nasal discharge, sinus congestion and sore throat.  Cardiovascular: Denied cardiovascular symptoms, arrhythmia, chest pain/pressure, edema, exercise intolerance, orthopnea and palpitations.  Respiratory: Denied pulmonary symptoms, asthma, pleuritic pain, productive sputum, cough, dyspnea and wheezing.  Gastrointestinal: Denied, gastro-esophageal reflux, melena, nausea and vomiting.  Genitourinary: Denied genitourinary symptoms including symptomatic vaginal discharge, pelvic relaxation issues, and urinary complaints.  Musculoskeletal: Denied musculoskeletal symptoms, stiffness, swelling, muscle weakness and myalgia.  Dermatologic: Denied dermatology symptoms, rash and scar.  Neurologic: Denied neurology symptoms, dizziness, headache, neck pain and syncope.  Psychiatric: Denied psychiatric symptoms, anxiety and depression.  Endocrine: Denied endocrine symptoms including hot flashes and night sweats.   Meds:   Current Outpatient Medications on File Prior to Visit  Medication Sig Dispense Refill   acetaminophen (TYLENOL) 325 MG  tablet Take 1 tablet by mouth.     estradiol (ESTRACE) 1  MG tablet Take 1 tablet (1 mg total) by mouth daily. 90 tablet 3   hydrocortisone (ANUSOL-HC) 25 MG suppository Insert one after EACH bm QD 25 suppository 0   LINZESS 145 MCG CAPS capsule TAKE 1 CAPSULE BY MOUTH EVERY DAY BEFORE BREAKFAST 90 capsule 1   Multiple Vitamin (MULTIVITAMIN) capsule Take 1 capsule by mouth daily.     omeprazole (PRILOSEC) 40 MG capsule Take 1 capsule (40 mg total) by mouth daily. 30 capsule 3   Vitamin D, Ergocalciferol, (DRISDOL) 1.25 MG (50000 UNIT) CAPS capsule TAKE 1 CAPSULE BY MOUTH ONE TIME PER WEEK 4 capsule 5   No current facility-administered medications on file prior to visit.    Assessment:    [id number] Patient Active Problem List   Diagnosis Date Noted   Varicose veins of both lower extremities with inflammation 12/11/2019   Pain in both lower extremities 11/05/2019   Baker's cyst of knee, unspecified laterality 11/05/2019   Irritable bowel syndrome with constipation 11/05/2019   Encounter for general adult medical examination with abnormal findings 08/19/2019   Chronic right shoulder pain 08/19/2019   Acute otitis media 11/19/2018   Screening for colon cancer 08/14/2018   Dysuria 08/14/2018   Vitamin D deficiency 08/14/2018   Excessive bleeding in premenopausal period 07/04/2018   Uterine leiomyoma 07/04/2018   Urinary tract infection with hematuria 04/05/2018   Acute non-recurrent sinusitis 04/05/2018   Acute low back pain 04/05/2018   Chest pain 01/17/2018   Generalized anxiety disorder 01/17/2018   Acute otitis externa of both ears 01/17/2018   Subacromial bursitis of right shoulder joint 12/15/2017   Pain in right hand 09/18/2017   Primary osteoarthritis of hands, bilateral 08/27/2017   Melanoma ([place]) 01/10/2017   History of anemia 01/17/2014   Morton's neuroma 01/17/2014   Multiple renal cysts 01/17/2014   Psoriatic arthritis ([place]) 01/17/2014     1. Postmenopausal hormone therapy     Patient doing well on Estrace.  Dose seems  appropriate.  Has noticed a resolution in hot flashes night sweats and is sleeping better.  She would like to continue.  Plan:            1.  Continue Estrace at current dose. Orders No orders of the defined types were placed in this encounter.   No orders of the defined types were placed in this encounter.     F/U  Return in about 9 months (around 03/30/2022) for Annual Physical. I spent 18 minutes involved in the care of this patient preparing to see the patient by obtaining and reviewing her medical history (including labs, imaging tests and prior procedures), documenting clinical information in the electronic health record (EHR), counseling and coordinating care plans, writing and sending prescriptions, ordering tests or procedures and in direct communicating with the patient and medical staff discussing pertinent items from her history and physical exam.   Finis Bud, M.D. 06/30/2021 7:44 AM

## 2021-07-27 ENCOUNTER — Encounter: Payer: Self-pay | Admitting: Physician Assistant

## 2021-08-27 ENCOUNTER — Ambulatory Visit (INDEPENDENT_AMBULATORY_CARE_PROVIDER_SITE_OTHER): Payer: BLUE CROSS/BLUE SHIELD | Admitting: Physician Assistant

## 2021-08-27 ENCOUNTER — Encounter: Payer: Self-pay | Admitting: Physician Assistant

## 2021-08-27 ENCOUNTER — Other Ambulatory Visit: Payer: Self-pay

## 2021-08-27 DIAGNOSIS — G8929 Other chronic pain: Secondary | ICD-10-CM

## 2021-08-27 DIAGNOSIS — I77810 Thoracic aortic ectasia: Secondary | ICD-10-CM | POA: Diagnosis not present

## 2021-08-27 DIAGNOSIS — R300 Dysuria: Secondary | ICD-10-CM | POA: Diagnosis not present

## 2021-08-27 DIAGNOSIS — E559 Vitamin D deficiency, unspecified: Secondary | ICD-10-CM | POA: Diagnosis not present

## 2021-08-27 DIAGNOSIS — K649 Unspecified hemorrhoids: Secondary | ICD-10-CM

## 2021-08-27 DIAGNOSIS — I517 Cardiomegaly: Secondary | ICD-10-CM | POA: Diagnosis not present

## 2021-08-27 DIAGNOSIS — Z1211 Encounter for screening for malignant neoplasm of colon: Secondary | ICD-10-CM

## 2021-08-27 DIAGNOSIS — Z1212 Encounter for screening for malignant neoplasm of rectum: Secondary | ICD-10-CM

## 2021-08-27 DIAGNOSIS — M25511 Pain in right shoulder: Secondary | ICD-10-CM | POA: Diagnosis not present

## 2021-08-27 DIAGNOSIS — R5383 Other fatigue: Secondary | ICD-10-CM | POA: Diagnosis not present

## 2021-08-27 DIAGNOSIS — E782 Mixed hyperlipidemia: Secondary | ICD-10-CM

## 2021-08-27 DIAGNOSIS — E538 Deficiency of other specified B group vitamins: Secondary | ICD-10-CM

## 2021-08-27 DIAGNOSIS — Z0001 Encounter for general adult medical examination with abnormal findings: Secondary | ICD-10-CM | POA: Diagnosis not present

## 2021-08-27 NOTE — Progress Notes (Signed)
?[facility] ?[address] ?[place], [place] [address] ? ?Internal MEDICINE  ?Office Visit Note ? ?Patient Name: Judith Gomez ? [id number]  ?[id number] ? ?Date of Service: 08/27/2021 ? ?Chief Complaint  ?Patient presents with  ? Annual Exam  ? Anemia  ? Arthritis  ? ? ? ?HPI ?Pt is here for routine health maintenance examination ?-In decmeber, took a nap and woke up with right shoulder pain. Then another day went out and numbness and tingling down right arm so she went to urgent care. The Xray was normal, except for increased calcium. They gave her a general steroid shot, but kept going on so went to [facility] and had injection in shoulder this time and extra calcium in shoulder on xray seen again. She states she has a MRI scheduled for Tuesday, then has a follow up with ortho the next morning. ?-Pain is impacting her sleep. Melatonin hasn't helped nor essential oils. She can go to sleep, but will wake up from pain. She is hopeful they will find an answer and treat appropriately so she can sleep better again ?-Sleep study declined due to cost. Also never saw neurology because headaches had stopped. She is willing to reconsider sleep study once shoulder taken care of as she may have met deductible by that point. Do want this done based on abnormal echo done last year. Will need to repeat echo next visit as well once MRI done. ?-Mammogram done, pap up to date with OBGYN. Has started estradiol for menopausal symptoms. ?-She is due for colonoscopy in July, does continue to have hemorrhoids as well and may discuss with GI further for colonoscopy ?-Due for routine fasting labs ? ?Current Medication: ?Outpatient Encounter Medications as of 08/27/2021  ?Medication Sig  ? acetaminophen (TYLENOL) 325 MG tablet Take 1 tablet by mouth.  ? diclofenac (VOLTAREN) 50 MG EC tablet Take 50 mg by mouth. As needed  ? estradiol (ESTRACE) 1 MG tablet Take 1 tablet (1 mg total) by mouth daily.  ? LINZESS 145 MCG CAPS capsule  TAKE 1 CAPSULE BY MOUTH EVERY DAY BEFORE BREAKFAST  ? Multiple Vitamin (MULTIVITAMIN) capsule Take 1 capsule by mouth daily.  ? omeprazole (PRILOSEC) 40 MG capsule Take 1 capsule (40 mg total) by mouth daily.  ? Vitamin D, Ergocalciferol, (DRISDOL) 1.25 MG (50000 UNIT) CAPS capsule TAKE 1 CAPSULE BY MOUTH ONE TIME PER WEEK  ? [DISCONTINUED] hydrocortisone (ANUSOL-HC) 25 MG suppository Insert one after EACH bm QD (Patient not taking: Reported on 08/27/2021)  ? ?No facility-administered encounter medications on file as of 08/27/2021.  ? ? ?Surgical History: ?Past Surgical History:  ?Procedure Laterality Date  ? CESAREAN SECTION    ? COLONOSCOPY    ? COLONOSCOPY WITH PROPOFOL N/A 01/04/2019  ? Procedure: COLONOSCOPY WITH PROPOFOL;  Surgeon: Jonathon Bellows, MD;  Location: [facility] ENDOSCOPY;  Service: Gastroenterology;  Laterality: N/A;  ? KNEE ARTHROSCOPY Left 11/18/2014  ? Procedure: ARTHROSCOPY KNEE;  Surgeon: Hessie Knows, MD;  Location: [facility] ORS;  Service: Orthopedics;  Laterality: Left;   partial medial menisectomy  ? THUMB FUSION Left 2014  ? TUBAL LIGATION    ? WISDOM TOOTH EXTRACTION    ? ? ?Medical History: ?Past Medical History:  ?Diagnosis Date  ? Anemia   ? Arthritis   ? Cancer [facility])   ? Chronic kidney disease   ? Heart murmur   ? ? ?Family History: ?Family History  ?Problem Relation Age of Onset  ? Heart disease Mother   ? Hyperlipidemia Mother   ?  Diabetes Father   ? Cancer Father   ? Arthritis Sister   ? Anxiety disorder Sister   ? Cancer Brother   ? Arthritis Sister   ? Arthritis Sister   ? Breast cancer Neg Hx   ? ? ? ? ?Review of Systems  ?Constitutional:  Negative for chills, fatigue and unexpected weight change.  ?HENT:  Negative for congestion, rhinorrhea, sneezing and sore throat.   ?Eyes:  Negative for redness.  ?Respiratory:  Negative for cough, chest tightness and shortness of breath.   ?Cardiovascular:  Negative for chest pain and palpitations.  ?Gastrointestinal:  Negative for abdominal pain,  constipation, diarrhea, nausea and vomiting.  ?     Hemorrhoids  ?Genitourinary:  Negative for dysuria and frequency.  ?Musculoskeletal:  Positive for arthralgias. Negative for back pain, joint swelling and neck pain.  ?Skin:  Negative for rash.  ?Neurological:  Positive for numbness. Negative for tremors.  ?Hematological:  Negative for adenopathy. Does not bruise/bleed easily.  ?Psychiatric/Behavioral:  Positive for sleep disturbance. Negative for behavioral problems (Depression) and suicidal ideas. The patient is not nervous/anxious.   ? ? ?Vital Signs: ?BP 127/87   Pulse 64   Temp 97.6 ?F (36.4 ?C)   Resp 16   Ht 5' 8" (1.727 m)   Wt 151 lb (68.5 kg)   SpO2 99%   BMI 22.96 kg/m?  ? ? ?Physical Exam ?Vitals and nursing note reviewed.  ?Constitutional:   ?   General: She is not in acute distress. ?   Appearance: She is well-developed and normal weight. She is not diaphoretic.  ?HENT:  ?   Head: Normocephalic and atraumatic.  ?   Right Ear: Tympanic membrane normal.  ?   Left Ear: Tympanic membrane normal.  ?   Mouth/Throat:  ?   Pharynx: No oropharyngeal exudate.  ?Eyes:  ?   Pupils: Pupils are equal, round, and reactive to light.  ?Neck:  ?   Thyroid: No thyromegaly.  ?   Vascular: No JVD.  ?   Trachea: No tracheal deviation.  ?Cardiovascular:  ?   Rate and Rhythm: Normal rate and regular rhythm.  ?   Heart sounds: Normal heart sounds. No murmur heard. ?  No friction rub. No gallop.  ?Pulmonary:  ?   Effort: Pulmonary effort is normal. No respiratory distress.  ?   Breath sounds: No wheezing or rales.  ?Chest:  ?   Chest wall: No tenderness.  ?Abdominal:  ?   General: Bowel sounds are normal.  ?   Palpations: Abdomen is soft.  ?   Tenderness: There is no abdominal tenderness.  ?Musculoskeletal:     ?   General: Tenderness present. Normal range of motion.  ?   Cervical back: Normal range of motion and neck supple.  ?   Comments: Decreased ROM right shoulder  ?Lymphadenopathy:  ?   Cervical: No cervical  adenopathy.  ?Skin: ?   General: Skin is warm and dry.  ?Neurological:  ?   Mental Status: She is alert and oriented to person, place, and time.  ?   Cranial Nerves: No cranial nerve deficit.  ?Psychiatric:     ?   Behavior: Behavior normal.     ?   Thought Content: Thought content normal.     ?   Judgment: Judgment normal.  ? ? ? ?LABS: ?No results found for this or any previous visit (from the past 2160 hour(s)). ? ? ? ? ? ?Assessment/Plan: ?1. Encounter for general adult  medical examination with abnormal findings ?CPE performed, routine fasting labs ordered, up-to-date on Pap and mammogram, due for colonoscopy ? ?2. Chronic right shoulder pain ?Followed by Ortho with upcoming MRI planned ? ?3. Dilated aortic root ([place]) ?Unfortunately has not had sleep study done due to cost but will reevaluate after shoulder MRI is done.  We will also be due for repeat echo at next visit ? ?4. Left atrial dilation ?Unfortunately has not had sleep study done due to cost but will reevaluate after shoulder MRI is done.  We will also be due for repeat echo at next visit ? ?5. Hemorrhoids, unspecified hemorrhoid type ?Refer to GI for colonoscopy however patient may address continued hemorrhoids as well ?- Ambulatory referral to Gastroenterology ? ?6. Screening for colorectal cancer ?- Ambulatory referral to Gastroenterology ? ?7. Mixed hyperlipidemia ?- Lipid Panel With LDL/HDL Ratio ? ?8. Vitamin D deficiency ?- VITAMIN D 25 Hydroxy (Vit-D Deficiency, Fractures) ? ?9. B12 deficiency ?- B12 and Folate Panel ? ?10. Other fatigue ?- TSH + free T4 ?- Lipid Panel With LDL/HDL Ratio ?- CBC w/Diff/Platelet ?- Comprehensive metabolic panel ? ?11. Dysuria ?- UA/M w/rflx Culture, Routine ? ? ?General Counseling: charlea nardo understanding of the findings of todays visit and agrees with plan of treatment. I have discussed any further diagnostic evaluation that may be needed or ordered today. We also reviewed her medications today. she has  been encouraged to call the office with any questions or concerns that should arise related to todays visit. ? ? ? ?Counseling: ? ? ? ?Orders Placed This Encounter  ?Procedures  ? UA/M w/rflx Culture, Routine  ? TSH + free

## 2021-08-28 LAB — CBC WITH DIFFERENTIAL/PLATELET
Basophils Absolute: 0 10*3/uL (ref 0.0–0.2)
Basos: 0 %
EOS (ABSOLUTE): 0 10*3/uL (ref 0.0–0.4)
Eos: 1 %
Hematocrit: 38.8 % (ref 34.0–46.6)
Hemoglobin: 13.2 g/dL (ref 11.1–15.9)
Immature Grans (Abs): 0 10*3/uL (ref 0.0–0.1)
Immature Granulocytes: 0 %
Lymphocytes Absolute: 1.2 10*3/uL (ref 0.7–3.1)
Lymphs: 33 %
MCH: 30.6 pg (ref 26.6–33.0)
MCHC: 34 g/dL (ref 31.5–35.7)
MCV: 90 fL (ref 79–97)
Monocytes Absolute: 0.3 10*3/uL (ref 0.1–0.9)
Monocytes: 7 %
Neutrophils Absolute: 2.2 10*3/uL (ref 1.4–7.0)
Neutrophils: 59 %
Platelets: 248 10*3/uL (ref 150–450)
RBC: 4.32 x10E6/uL (ref 3.77–5.28)
RDW: 12.1 % (ref 11.7–15.4)
WBC: 3.8 10*3/uL (ref 3.4–10.8)

## 2021-08-28 LAB — VITAMIN D 25 HYDROXY (VIT D DEFICIENCY, FRACTURES): Vit D, 25-Hydroxy: 39.9 ng/mL (ref 30.0–100.0)

## 2021-08-28 LAB — TSH+FREE T4
Free T4: 1.24 ng/dL (ref 0.82–1.77)
TSH: 2.95 u[IU]/mL (ref 0.450–4.500)

## 2021-08-28 LAB — LIPID PANEL WITH LDL/HDL RATIO
Cholesterol, Total: 245 mg/dL — ABNORMAL HIGH (ref 100–199)
HDL: 71 mg/dL (ref >39)
LDL Chol Calc (NIH): 162 mg/dL — ABNORMAL HIGH (ref 0–99)
LDL/HDL Ratio: 2.3 ratio (ref 0.0–3.2)
Triglycerides: 73 mg/dL (ref 0–149)
VLDL Cholesterol Cal: 12 mg/dL (ref 5–40)

## 2021-08-28 LAB — COMPREHENSIVE METABOLIC PANEL
ALT: 10 IU/L (ref 0–32)
AST: 14 IU/L (ref 0–40)
Albumin/Globulin Ratio: 2.3 — ABNORMAL HIGH (ref 1.2–2.2)
Albumin: 4.8 g/dL (ref 3.8–4.9)
Alkaline Phosphatase: 73 IU/L (ref 44–121)
BUN/Creatinine Ratio: 12 (ref 9–23)
BUN: 9 mg/dL (ref 6–24)
Bilirubin Total: 0.5 mg/dL (ref 0.0–1.2)
CO2: 25 mmol/L (ref 20–29)
Calcium: 9.7 mg/dL (ref 8.7–10.2)
Chloride: 104 mmol/L (ref 96–106)
Creatinine, Ser: 0.73 mg/dL (ref 0.57–1.00)
Globulin, Total: 2.1 g/dL (ref 1.5–4.5)
Glucose: 102 mg/dL — ABNORMAL HIGH (ref 70–99)
Potassium: 4.6 mmol/L (ref 3.5–5.2)
Sodium: 142 mmol/L (ref 134–144)
Total Protein: 6.9 g/dL (ref 6.0–8.5)
eGFR: 99 mL/min/{1.73_m2} (ref >59)

## 2021-08-28 LAB — B12 AND FOLATE PANEL
Folate: 10.3 ng/mL (ref >3.0)
Vitamin B-12: 278 pg/mL (ref 232–1245)

## 2021-08-30 ENCOUNTER — Telehealth: Payer: Self-pay

## 2021-08-30 NOTE — Telephone Encounter (Signed)
Scheduled for 12/09/2021 ?

## 2021-08-31 ENCOUNTER — Telehealth: Payer: Self-pay

## 2021-08-31 DIAGNOSIS — M7531 Calcific tendinitis of right shoulder: Secondary | ICD-10-CM | POA: Diagnosis not present

## 2021-08-31 MED ORDER — ROSUVASTATIN CALCIUM 5 MG PO TABS: ORAL_TABLET | ORAL | 1 refills | Status: DC

## 2021-08-31 NOTE — Telephone Encounter (Signed)
Spoke to pt and informed her of lab results.  Informed her that her cholesterol was elevated and Lauren wanted her to start on Crestor 5 mg which pt was ok to start on the medication and we sent to her pharmacy.  Pt also advised that her b12 was low and Lauren would like for pt to get b12 injections once a week for 3 weeks and then once a month until further notice.   ?

## 2021-08-31 NOTE — Telephone Encounter (Signed)
-----   Message from Mylinda Latina, PA-C sent at 08/31/2021 11:16 AM EDT ----- ?Please let her know that her cholesterol is elevated and has increased further since last checked and I would like to start on '5mg'$  crestor nightly-- if she is agreeable please send. Also avoid fried/fatty foods. Additionally her B12 is low and I would recommend B12 injections weeklyx3 then monthly and can schedule these if agreeable ?

## 2021-09-01 ENCOUNTER — Other Ambulatory Visit: Payer: Self-pay

## 2021-09-01 DIAGNOSIS — E559 Vitamin D deficiency, unspecified: Secondary | ICD-10-CM

## 2021-09-01 MED ORDER — "BD LUER-LOK SYRINGE 25G X 1"" 3 ML MISC": 5 refills | Status: DC

## 2021-09-01 MED ORDER — CYANOCOBALAMIN 1000 MCG/ML IJ SOLN: INTRAMUSCULAR | 3 refills | Status: DC

## 2021-09-01 NOTE — Telephone Encounter (Signed)
Spoke to Judith Gomez and informed her that per Ander Purpura we sent b12 injection to pharmacy.  I explained to Judith Gomez to make sure that she has a reliable person to inject the b12.  Judith Gomez advised that her neighbor is a Software engineer and she is able to do the injections for her.  I also explained to Judith Gomez that the injections of b12 are once a week for 3 weeks and then once a month until further notice.  Judith Gomez understood instructions and prescriptions were sent to Judith Gomez's pharmacy ?

## 2021-09-02 DIAGNOSIS — M7531 Calcific tendinitis of right shoulder: Secondary | ICD-10-CM | POA: Diagnosis not present

## 2021-09-03 LAB — UA/M W/RFLX CULTURE, ROUTINE
Bilirubin, UA: NEGATIVE
Glucose, UA: NEGATIVE
Leukocytes,UA: NEGATIVE
Nitrite, UA: NEGATIVE
Protein,UA: NEGATIVE
RBC, UA: NEGATIVE
Specific Gravity, UA: 1.02 (ref 1.005–1.030)
Urobilinogen, Ur: 0.2 mg/dL (ref 0.2–1.0)
pH, UA: 5 (ref 5.0–7.5)

## 2021-09-03 LAB — MICROSCOPIC EXAMINATION
Casts: NONE SEEN /lpf
Epithelial Cells (non renal): >10 /hpf — AB (ref 0–10)

## 2021-09-03 LAB — URINE CULTURE, REFLEX

## 2021-09-05 ENCOUNTER — Other Ambulatory Visit: Payer: Self-pay | Admitting: Physician Assistant

## 2021-09-05 DIAGNOSIS — N3000 Acute cystitis without hematuria: Secondary | ICD-10-CM

## 2021-09-05 MED ORDER — NITROFURANTOIN MONOHYD MACRO 100 MG PO CAPS: ORAL_CAPSULE | ORAL | 0 refills | Status: DC

## 2021-09-06 ENCOUNTER — Telehealth: Payer: Self-pay

## 2021-09-06 NOTE — Telephone Encounter (Signed)
Pt informed of lab results of a UTI and that we sent in [place] twice a day for 10 days to her pharmacy. ?

## 2021-09-06 NOTE — Telephone Encounter (Signed)
-----   Message from Mylinda Latina, PA-C sent at 09/05/2021  3:12 PM EDT ----- ?Please let her know that she has a Uti and I have sent macrobid for her ?

## 2021-09-09 ENCOUNTER — Other Ambulatory Visit: Payer: Self-pay

## 2021-09-09 ENCOUNTER — Emergency Department
Admission: EM | Admit: 2021-09-09 | Discharge: 2021-09-09 | Disposition: A | Discharge status: Home / Self Care | Payer: BLUE CROSS/BLUE SHIELD | Attending: Emergency Medicine | Admitting: Emergency Medicine

## 2021-09-09 ENCOUNTER — Emergency Department: Payer: BLUE CROSS/BLUE SHIELD

## 2021-09-09 ENCOUNTER — Encounter: Payer: Self-pay | Admitting: Emergency Medicine

## 2021-09-09 DIAGNOSIS — W109XXA Fall (on) (from) unspecified stairs and steps, initial encounter: Secondary | ICD-10-CM | POA: Insufficient documentation

## 2021-09-09 DIAGNOSIS — S93402A Sprain of unspecified ligament of left ankle, initial encounter: Secondary | ICD-10-CM | POA: Insufficient documentation

## 2021-09-09 DIAGNOSIS — S99912A Unspecified injury of left ankle, initial encounter: Secondary | ICD-10-CM | POA: Diagnosis not present

## 2021-09-09 DIAGNOSIS — Z043 Encounter for examination and observation following other accident: Secondary | ICD-10-CM | POA: Diagnosis not present

## 2021-09-09 NOTE — ED Provider Notes (Signed)
? ?[facility] ?Provider Note ? ? ? Event Date/Time  ? First MD Initiated Contact with Patient 09/09/21 2319   ?  (approximate) ? ? ?History  ? ?Ankle Pain ? ? ?HPI ? ?Judith Gomez is a 51 y.o. female with some prior orthopedic issues most notably on her right ankle but who presents tonight with some pain and swelling in her left ankle after falling while ambulating down some stairs.  She reports that the steroid was dark and she thought she was at the bottom but there was still 1 more step so she tripped and rolled her ankle.  She did not strike her head or lose consciousness.  No other injuries other than a contusion to her left elbow but which otherwise feels fine.  She reports some pain radiating up from the ankle and some swelling on the outside of the ankle. ?  ? ? ?Physical Exam  ? ?Triage Vital Signs: ?ED Triage Vitals  ?Enc Vitals Group  ?   BP 09/09/21 2145 (!) 136/99  ?   Pulse Rate 09/09/21 2145 83  ?   Resp 09/09/21 2145 18  ?   Temp 09/09/21 2145 97.7 ?F (36.5 ?C)  ?   Temp Source 09/09/21 2145 Oral  ?   SpO2 09/09/21 2145 95 %  ?   Weight 09/09/21 2145 68 kg (150 lb)  ?   Height 09/09/21 2145 1.727 m ('5\' 8"'$ )  ?   Head Circumference --   ?   Peak Flow --   ?   Pain Score 09/09/21 2144 8  ?   Pain Loc --   ?   Pain Edu? --   ?   Excl. in [place]? --   ? ? ?Most recent vital signs: ?Vitals:  ? 09/09/21 2145  ?BP: (!) 136/99  ?Pulse: 83  ?Resp: 18  ?Temp: 97.7 ?F (36.5 ?C)  ?SpO2: 95%  ? ? ? ?General: Awake, no distress.  ?CV:  Good peripheral perfusion.  Easily palpable distal pulse in left foot. ?Resp:  Normal effort.  ?Abd:  No distention.  ?Other:  Mild swelling around the left lateral malleolus.  Tender to palpation all around the area.  Did not attempt much manipulation of the foot and ankle given the tenderness.  No clinically significant tenderness to palpation throughout the tibia/fibula nor of the knee.  Able to flex and extend the knee without difficulty. ? ? ?ED Results /  Procedures / Treatments  ? ? ?RADIOLOGY ?No evidence of fracture or dislocation on left ankle films ? ? ? ?PROCEDURES: ? ?Critical Care performed: No ? ?Procedures ? ? ?MEDICATIONS ORDERED IN ED: ?Medications - No data to display ? ? ?IMPRESSION / MDM / ASSESSMENT AND PLAN / ED COURSE  ?I reviewed the triage vital signs and the nursing notes. ?             ?               ? ?Differential diagnosis includes, but is not limited to, ankle sprain, fracture/dislocation, other more proximal injury. ? ?I reviewed the patient's vital signs which are reassuring within normal limits other than very mild hypertension. ? ?No indication for head CT nor cervical spine CT based on mechanism of injury and Canadian head CT rules and Nexus criteria. ? ?Left elbow is mildly contused but has normal flexion extension, no indication for x-rays. ? ?I personally reviewed the patient's ankle x-rays.  I see no evidence of fracture  nor dislocation.  The radiology report agrees.  Based on physical exam there is no indication of additional injury requiring additional x-rays, such as of the knee or tib-fib. ? ?Physical exam is reassuring and consistent with relatively mild ankle sprain.  I had my usual and customary ankle sprain management discussion with the patient (RICE, weightbearing as tolerated, etc.).  We provided Ace wrap and crutches.  Patient is already an established patient of Dr. Sharlet Salina and she can follow-up with him in clinic as needed.  I gave my usual and customary return precautions. ? ? ? ? ?  ? ? ?FINAL CLINICAL IMPRESSION(S) / ED DIAGNOSES  ? ?Final diagnoses:  ?Sprain of left ankle, unspecified ligament, initial encounter  ? ? ? ?Rx / DC Orders  ? ?ED Discharge Orders   ? ? None  ? ?  ? ? ? ?Note:  This document was prepared using Dragon voice recognition software and may include unintentional dictation errors. ?  ?Hinda Kehr, MD ?09/10/21 [phone] ? ?

## 2021-09-09 NOTE — ED Triage Notes (Addendum)
Pt to triage via w/c with no distress noted; reports PTA fell down steps injuring left ankle and elbow; denies any other c/o or injuries ?

## 2021-09-23 ENCOUNTER — Ambulatory Visit: Payer: BLUE CROSS/BLUE SHIELD | Admitting: Physician Assistant

## 2021-09-23 DIAGNOSIS — M25511 Pain in right shoulder: Secondary | ICD-10-CM | POA: Diagnosis not present

## 2021-11-06 ENCOUNTER — Other Ambulatory Visit: Payer: Self-pay | Admitting: Physician Assistant

## 2021-11-06 DIAGNOSIS — K581 Irritable bowel syndrome with constipation: Secondary | ICD-10-CM

## 2021-11-18 ENCOUNTER — Telehealth: Payer: Self-pay

## 2021-11-18 NOTE — Telephone Encounter (Signed)
Rescheduled patients appointment for her sent new reminder out

## 2021-11-29 ENCOUNTER — Encounter

## 2021-11-29 ENCOUNTER — Inpatient Hospital Stay: Admit: 2021-11-29 | Payer: BLUE CROSS/BLUE SHIELD | Primary: Internal Medicine

## 2021-11-29 DIAGNOSIS — Z1231 Encounter for screening mammogram for malignant neoplasm of breast: Secondary | ICD-10-CM

## 2021-12-01 ENCOUNTER — Emergency Department
Admission: EM | Admit: 2021-12-01 | Discharge: 2021-12-01 | Disposition: A | Discharge status: Home / Self Care | Payer: BLUE CROSS/BLUE SHIELD | Attending: Emergency Medicine | Admitting: Emergency Medicine

## 2021-12-01 ENCOUNTER — Encounter: Payer: Self-pay | Admitting: Emergency Medicine

## 2021-12-01 ENCOUNTER — Other Ambulatory Visit: Payer: Self-pay

## 2021-12-01 DIAGNOSIS — Z85820 Personal history of malignant melanoma of skin: Secondary | ICD-10-CM | POA: Diagnosis not present

## 2021-12-01 DIAGNOSIS — R200 Anesthesia of skin: Secondary | ICD-10-CM | POA: Insufficient documentation

## 2021-12-01 DIAGNOSIS — N189 Chronic kidney disease, unspecified: Secondary | ICD-10-CM | POA: Insufficient documentation

## 2021-12-01 DIAGNOSIS — M25511 Pain in right shoulder: Secondary | ICD-10-CM | POA: Insufficient documentation

## 2021-12-01 MED ORDER — HYDROCODONE-ACETAMINOPHEN 5-325 MG PO TABS
1.0000 | ORAL_TABLET | Freq: Once | ORAL | Status: AC
  Administered 2021-12-01: 1 via ORAL
  Filled 2021-12-01: qty 1

## 2021-12-01 MED ORDER — HYDROCODONE-ACETAMINOPHEN 5-325 MG PO TABS: 1.0000 | ORAL_TABLET | Freq: Four times a day (QID) | ORAL | 0 refills | Status: AC | PRN

## 2021-12-01 NOTE — ED Triage Notes (Signed)
Pt presents to ER from home with right arm pain. 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 Pt reports developed achy pain last night and severe pain today all day. Pt reports she is not able to move her arm because pain increases. Pt denies any recent injury. Pt talks in complete sentences no distress noted.

## 2021-12-01 NOTE — ED Provider Notes (Signed)
[facility] Provider Note    Event Date/Time   First MD Initiated Contact with Patient 12/01/21 1934     (approximate)   History   Extremity Weakness   HPI  Judith Gomez is a 51 y.o. 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  Patient has been having issues with the right shoulder since December.  She has seen [facility] had an MRI that was negative for rotator cuff tear but did show calcific tendinitis.  She has had several joint injections.  She was feeling okay until 2 days ago when she developed significant pain in the right shoulder.  She was opening a jar and supinated the arm and had excruciating pain in the right shoulder.  Radiates to the neck.  Endorses some numbness in the hand.  Does feel similar to prior pain but also feels more severe in the biceps groove which is different.  Denies any significant injury.  No other focal numbness weakness.  No chest pain.  She was hoping to get a joint injection today.  Has been taking diclofenac gabapentin.  Does not want to take opiates.  She tried going to [facility] but they were not able to get her in today.     Past Medical History:  Diagnosis Date   Anemia    Arthritis    Cancer ([place])    Chronic kidney disease    Heart murmur     Patient Active Problem List   Diagnosis Date Noted   Varicose veins of both lower extremities with inflammation 12/11/2019   Pain in both lower extremities 11/05/2019   Baker's cyst of knee, unspecified laterality 11/05/2019   Irritable bowel syndrome with constipation 11/05/2019   Encounter for general adult medical examination with abnormal findings 08/19/2019   Chronic right shoulder pain 08/19/2019   Acute otitis media 11/19/2018   Screening for colon cancer 08/14/2018   Dysuria 08/14/2018   Vitamin D deficiency 08/14/2018   Excessive bleeding in premenopausal period 07/04/2018   Uterine leiomyoma 07/04/2018   Urinary  tract infection with hematuria 04/05/2018   Acute non-recurrent sinusitis 04/05/2018   Acute low back pain 04/05/2018   Chest pain 01/17/2018   Generalized anxiety disorder 01/17/2018   Acute otitis externa of both ears 01/17/2018   Subacromial bursitis of right shoulder joint 12/15/2017   Pain in right hand 09/18/2017   Primary osteoarthritis of hands, bilateral 08/27/2017   Melanoma ([place]) 01/10/2017   History of anemia 01/17/2014   Morton's neuroma 01/17/2014   Multiple renal cysts 01/17/2014   Psoriatic arthritis ([place]) 01/17/2014     Physical Exam  Triage Vital Signs: ED Triage Vitals  Enc Vitals Group     BP 12/01/21 1926 (!) 176/98     Pulse Rate 12/01/21 1926 72     Resp 12/01/21 1926 16     Temp 12/01/21 1926 98.7 F (37.1 C)     Temp Source 12/01/21 1926 Oral     SpO2 12/01/21 1926 98 %     Weight 12/01/21 1932 150 lb (68 kg)     Height 12/01/21 1932 '5\' 8"'$  (1.727 m)     Head Circumference --      Peak Flow --      Pain Score 12/01/21 1931 10     Pain Loc --      Pain Edu? --      Excl. in [place]? --     Most recent vital signs:  Vitals:   12/01/21 1926  BP: (!) 176/98  Pulse: 72  Resp: 16  Temp: 98.7 F (37.1 C)  SpO2: 98%     General: Awake, no distress.  CV:  Good peripheral perfusion.  Resp:  Normal effort.  Abd:  No distention.  Neuro:             Awake, Alert, Oriented x 3  Other:  No significant pain with palpation of the shoulder, no midline C-spine tenderness Significant pain and limitation with flexion abduction and internal rotation, positive impingement testing tenderness to palpation over the biceps groove 5-5 strength with grip, thumbs up, finger abduction and okay sign   ED Results / Procedures / Treatments  Labs (all labs ordered are listed, but only abnormal results are displayed) Labs Reviewed - No data to display   EKG     RADIOLOGY    PROCEDURES:  Critical Care performed: No  Procedures   MEDICATIONS ORDERED IN  ED: Medications  HYDROcodone-acetaminophen (NORCO/VICODIN) 5-325 MG per tablet 1 tablet (has no administration in time range)     IMPRESSION / MDM / ASSESSMENT AND PLAN / ED COURSE  I reviewed the triage vital signs and the nursing notes.                              Patient's presentation is most consistent with severe exacerbation of chronic illness.  Differential diagnosis includes, but is not limited to, biceps tendon mitis, biceps tear, rotator cuff injury, calcific tendinitis, septic arthritis, cervical radiculopathy  Patient is a 51 year old female with ongoing issues with the right shoulder MRI diagnosis of calcific tendinitis who has received multiple steroid injections with [facility] presents with worsening of right shoulder pain.  Seems to have worsened over the last 2 days was precipitated by opening a can when she supinated the arm and felt excruciating pain in the biceps groove.  She has significant limitation of range of motion and difficulty performing activities.  Has been taking NSAIDs gabapentin does not want to take opiates.  She was not able to get into see [facility] today.  Called the ED and was informed that there is Ortho on-call so she was hoping to get another joint injection.  On exam she does have significant limitation range of motion of the shoulder pain induced with abduction flexion internal rotation, positive impingement testing.  Strength and pulses are normal.  I suspect that this is an exacerbation of her calcific tendinitis she is however significantly tender over the biceps groove that could be a component of biceps tendinitis.  I did discuss pain options including a very short course of an opioid just for night she was willing to use hydrocodone just nightly to sleep.  She was given a dose in the ED.  Discussed ongoing use of NSAIDs gabapentin and orthopedic follow-up.        FINAL CLINICAL IMPRESSION(S) / ED DIAGNOSES   Final diagnoses:  Acute pain  of right shoulder     Rx / DC Orders   ED Discharge Orders          Ordered    HYDROcodone-acetaminophen (NORCO/VICODIN) 5-325 MG tablet  Every 6 hours PRN        12/01/21 2114             Note:  This document was prepared using Dragon voice recognition software and may include unintentional dictation errors.   Rada Hay, MD 12/01/21  2120  

## 2021-12-02 ENCOUNTER — Telehealth: Payer: Self-pay

## 2021-12-02 DIAGNOSIS — M9904 Segmental and somatic dysfunction of sacral region: Secondary | ICD-10-CM | POA: Diagnosis not present

## 2021-12-02 DIAGNOSIS — M7701 Medial epicondylitis, right elbow: Secondary | ICD-10-CM | POA: Diagnosis not present

## 2021-12-02 DIAGNOSIS — M9903 Segmental and somatic dysfunction of lumbar region: Secondary | ICD-10-CM | POA: Diagnosis not present

## 2021-12-02 DIAGNOSIS — M9901 Segmental and somatic dysfunction of cervical region: Secondary | ICD-10-CM | POA: Diagnosis not present

## 2021-12-02 DIAGNOSIS — M9905 Segmental and somatic dysfunction of pelvic region: Secondary | ICD-10-CM | POA: Diagnosis not present

## 2021-12-02 NOTE — Telephone Encounter (Signed)
Called patient to schedule ED follow up. She stated she will following up with orthopedic-Toni

## 2021-12-06 DIAGNOSIS — M67813 Other specified disorders of tendon, right shoulder: Secondary | ICD-10-CM | POA: Diagnosis not present

## 2021-12-09 ENCOUNTER — Ambulatory Visit: Payer: BLUE CROSS/BLUE SHIELD | Admitting: Gastroenterology

## 2021-12-22 DIAGNOSIS — G8918 Other acute postprocedural pain: Secondary | ICD-10-CM | POA: Diagnosis not present

## 2021-12-22 DIAGNOSIS — M24111 Other articular cartilage disorders, right shoulder: Secondary | ICD-10-CM | POA: Diagnosis not present

## 2021-12-22 DIAGNOSIS — M65811 Other synovitis and tenosynovitis, right shoulder: Secondary | ICD-10-CM | POA: Diagnosis not present

## 2021-12-22 DIAGNOSIS — S46111A Strain of muscle, fascia and tendon of long head of biceps, right arm, initial encounter: Secondary | ICD-10-CM | POA: Diagnosis not present

## 2021-12-22 DIAGNOSIS — S46011A Strain of muscle(s) and tendon(s) of the rotator cuff of right shoulder, initial encounter: Secondary | ICD-10-CM | POA: Diagnosis not present

## 2021-12-22 DIAGNOSIS — M7551 Bursitis of right shoulder: Secondary | ICD-10-CM | POA: Diagnosis not present

## 2021-12-22 DIAGNOSIS — M7541 Impingement syndrome of right shoulder: Secondary | ICD-10-CM | POA: Diagnosis not present

## 2021-12-22 DIAGNOSIS — M25311 Other instability, right shoulder: Secondary | ICD-10-CM | POA: Diagnosis not present

## 2021-12-22 DIAGNOSIS — M67813 Other specified disorders of tendon, right shoulder: Secondary | ICD-10-CM | POA: Diagnosis not present

## 2021-12-27 ENCOUNTER — Ambulatory Visit: Payer: BLUE CROSS/BLUE SHIELD | Admitting: Physician Assistant

## 2021-12-27 DIAGNOSIS — M25511 Pain in right shoulder: Secondary | ICD-10-CM | POA: Diagnosis not present

## 2021-12-30 DIAGNOSIS — M25511 Pain in right shoulder: Secondary | ICD-10-CM | POA: Diagnosis not present

## 2022-01-04 DIAGNOSIS — M25511 Pain in right shoulder: Secondary | ICD-10-CM | POA: Diagnosis not present

## 2022-01-06 DIAGNOSIS — M25511 Pain in right shoulder: Secondary | ICD-10-CM | POA: Diagnosis not present

## 2022-01-11 DIAGNOSIS — M25511 Pain in right shoulder: Secondary | ICD-10-CM | POA: Diagnosis not present

## 2022-01-13 DIAGNOSIS — M25511 Pain in right shoulder: Secondary | ICD-10-CM | POA: Diagnosis not present

## 2022-01-18 DIAGNOSIS — M25511 Pain in right shoulder: Secondary | ICD-10-CM | POA: Diagnosis not present

## 2022-01-20 DIAGNOSIS — M25511 Pain in right shoulder: Secondary | ICD-10-CM | POA: Diagnosis not present

## 2022-01-20 DIAGNOSIS — K645 Perianal venous thrombosis: Secondary | ICD-10-CM | POA: Diagnosis not present

## 2022-01-20 DIAGNOSIS — M25611 Stiffness of right shoulder, not elsewhere classified: Secondary | ICD-10-CM | POA: Diagnosis not present

## 2022-01-25 DIAGNOSIS — M25611 Stiffness of right shoulder, not elsewhere classified: Secondary | ICD-10-CM | POA: Diagnosis not present

## 2022-01-25 DIAGNOSIS — M25511 Pain in right shoulder: Secondary | ICD-10-CM | POA: Diagnosis not present

## 2022-01-27 DIAGNOSIS — M25611 Stiffness of right shoulder, not elsewhere classified: Secondary | ICD-10-CM | POA: Diagnosis not present

## 2022-01-27 DIAGNOSIS — M25511 Pain in right shoulder: Secondary | ICD-10-CM | POA: Diagnosis not present

## 2022-02-01 DIAGNOSIS — M25611 Stiffness of right shoulder, not elsewhere classified: Secondary | ICD-10-CM | POA: Diagnosis not present

## 2022-02-01 DIAGNOSIS — M25511 Pain in right shoulder: Secondary | ICD-10-CM | POA: Diagnosis not present

## 2022-02-03 DIAGNOSIS — M25511 Pain in right shoulder: Secondary | ICD-10-CM | POA: Diagnosis not present

## 2022-02-03 DIAGNOSIS — M25611 Stiffness of right shoulder, not elsewhere classified: Secondary | ICD-10-CM | POA: Diagnosis not present

## 2022-02-08 DIAGNOSIS — M25511 Pain in right shoulder: Secondary | ICD-10-CM | POA: Diagnosis not present

## 2022-02-08 DIAGNOSIS — M25611 Stiffness of right shoulder, not elsewhere classified: Secondary | ICD-10-CM | POA: Diagnosis not present

## 2022-02-10 DIAGNOSIS — M25611 Stiffness of right shoulder, not elsewhere classified: Secondary | ICD-10-CM | POA: Diagnosis not present

## 2022-02-10 DIAGNOSIS — M25511 Pain in right shoulder: Secondary | ICD-10-CM | POA: Diagnosis not present

## 2022-02-15 DIAGNOSIS — M25611 Stiffness of right shoulder, not elsewhere classified: Secondary | ICD-10-CM | POA: Diagnosis not present

## 2022-02-15 DIAGNOSIS — M25511 Pain in right shoulder: Secondary | ICD-10-CM | POA: Diagnosis not present

## 2022-02-17 DIAGNOSIS — M25611 Stiffness of right shoulder, not elsewhere classified: Secondary | ICD-10-CM | POA: Diagnosis not present

## 2022-02-17 DIAGNOSIS — M25511 Pain in right shoulder: Secondary | ICD-10-CM | POA: Diagnosis not present

## 2022-02-22 DIAGNOSIS — M25511 Pain in right shoulder: Secondary | ICD-10-CM | POA: Diagnosis not present

## 2022-02-22 DIAGNOSIS — M25611 Stiffness of right shoulder, not elsewhere classified: Secondary | ICD-10-CM | POA: Diagnosis not present

## 2022-02-24 DIAGNOSIS — M25611 Stiffness of right shoulder, not elsewhere classified: Secondary | ICD-10-CM | POA: Diagnosis not present

## 2022-02-24 DIAGNOSIS — M25511 Pain in right shoulder: Secondary | ICD-10-CM | POA: Diagnosis not present

## 2022-02-26 ENCOUNTER — Other Ambulatory Visit: Payer: Self-pay | Admitting: Physician Assistant

## 2022-03-01 DIAGNOSIS — M25511 Pain in right shoulder: Secondary | ICD-10-CM | POA: Diagnosis not present

## 2022-03-01 DIAGNOSIS — M25611 Stiffness of right shoulder, not elsewhere classified: Secondary | ICD-10-CM | POA: Diagnosis not present

## 2022-03-03 DIAGNOSIS — M25511 Pain in right shoulder: Secondary | ICD-10-CM | POA: Diagnosis not present

## 2022-03-03 DIAGNOSIS — M25611 Stiffness of right shoulder, not elsewhere classified: Secondary | ICD-10-CM | POA: Diagnosis not present

## 2022-03-08 DIAGNOSIS — M25611 Stiffness of right shoulder, not elsewhere classified: Secondary | ICD-10-CM | POA: Diagnosis not present

## 2022-03-08 DIAGNOSIS — M25511 Pain in right shoulder: Secondary | ICD-10-CM | POA: Diagnosis not present

## 2022-03-10 DIAGNOSIS — M25611 Stiffness of right shoulder, not elsewhere classified: Secondary | ICD-10-CM | POA: Diagnosis not present

## 2022-03-10 DIAGNOSIS — M25511 Pain in right shoulder: Secondary | ICD-10-CM | POA: Diagnosis not present

## 2022-03-17 DIAGNOSIS — M25511 Pain in right shoulder: Secondary | ICD-10-CM | POA: Diagnosis not present

## 2022-03-17 DIAGNOSIS — M25611 Stiffness of right shoulder, not elsewhere classified: Secondary | ICD-10-CM | POA: Diagnosis not present

## 2022-03-21 DIAGNOSIS — M25511 Pain in right shoulder: Secondary | ICD-10-CM | POA: Diagnosis not present

## 2022-03-21 DIAGNOSIS — M25611 Stiffness of right shoulder, not elsewhere classified: Secondary | ICD-10-CM | POA: Diagnosis not present

## 2022-03-28 ENCOUNTER — Ambulatory Visit (INDEPENDENT_AMBULATORY_CARE_PROVIDER_SITE_OTHER): Payer: BLUE CROSS/BLUE SHIELD | Admitting: Gastroenterology

## 2022-03-28 VITALS — BP 148/89 | HR 83 | Ht 68.0 in | Wt 145.8 lb

## 2022-03-28 DIAGNOSIS — K641 Second degree hemorrhoids: Secondary | ICD-10-CM | POA: Diagnosis not present

## 2022-03-28 NOTE — Progress Notes (Signed)
Jonathon Bellows MD, MRCP(U.K) [address]  [place], [place] [address]  Main: [phone]  Fax: [phone]   Gastroenterology Consultation  Referring Provider:     Mylinda Latina, PA* Primary Care Physician:  Mylinda Latina, PA-C Primary Gastroenterologist:  Dr. Jonathon Bellows  Reason for Consultation:     Hemorrhoids         HPI:   Judith Gomez is a 51 y.o. y/o female referred for consultation & management  by  Mylinda Latina, PA-C.   She states she is here today to see me for internal hemorrhoids.  Previously she has had thrombosed hemorrhoids and has been treated surgically presently feels like she had her hemorrhoid prolapsing and going back inside no bleeding no perianal itching just uncomfortable feeling and she would like to get banded  01/04/2019: Colonoscopy:  Polyps x 2 in ascending colon x sessile serrated polyps Chaperone  present in the room  PROCEDURE NOTE: The patient presents with symptomatic grade 1 hemorrhoids, unresponsive to maximal medical therapy, requesting rubber band ligation of his/her hemorrhoidal disease.  All risks, benefits and alternative forms of therapy were described and informed consent was obtained.  In the Left Lateral Decubitus position (if anoscopy is performed) anoscopic examination revealed grade 1 hemorrhoids in the all position(s).   The decision was made to band the LL internal hemorrhoid, and the [place] was used to perform band ligation without complication.  Digital anorectal examination was then performed to assure proper positioning of the band, and to adjust the banded tissue as required.  The patient was discharged home without pain or other issues.  Dietary and behavioral recommendations were given and (if necessary - prescriptions were given), along with follow-up instructions.  The patient will return 4 weeks for follow-up and possible additional banding as required.  No complications were  encountered and the patient tolerated the procedure well.    Past Medical History:  Diagnosis Date   Anemia    Arthritis    Cancer ([place])    Chronic kidney disease    Heart murmur     Past Surgical History:  Procedure Laterality Date   CESAREAN SECTION     COLONOSCOPY     COLONOSCOPY WITH PROPOFOL N/A 01/04/2019   Procedure: COLONOSCOPY WITH PROPOFOL;  Surgeon: Jonathon Bellows, MD;  Location: [facility] ENDOSCOPY;  Service: Gastroenterology;  Laterality: N/A;   KNEE ARTHROSCOPY Left 11/18/2014   Procedure: ARTHROSCOPY KNEE;  Surgeon: Hessie Knows, MD;  Location: [facility] ORS;  Service: Orthopedics;  Laterality: Left;   partial medial menisectomy   THUMB FUSION Left 2014   TUBAL LIGATION     WISDOM TOOTH EXTRACTION      Prior to Admission medications   Medication Sig Start Date End Date Taking? Authorizing Provider  acetaminophen (TYLENOL) 325 MG tablet Take 1 tablet by mouth.    [provider]  cyanocobalamin (,VITAMIN B-12,) 1000 MCG/ML injection Inject 1 ml intramuscular once a week for 3 weeks.  Then inject 1 ml once every 30 days  E55.9 09/01/21   McDonough, Si Gaul, PA-C  diclofenac (VOLTAREN) 50 MG EC tablet Take 50 mg by mouth. As needed    [provider]  estradiol (ESTRACE) 1 MG tablet Take 1 tablet (1 mg total) by mouth daily. 03/30/21 03/30/22  Harlin Heys, MD  LINZESS 145 MCG CAPS capsule TAKE 1 CAPSULE BY MOUTH EVERY DAY BEFORE BREAKFAST 11/07/21   McDonough, Si Gaul, PA-C  Multiple Vitamin (  MULTIVITAMIN) capsule Take 1 capsule by mouth daily.    [provider]  nitrofurantoin, macrocrystal-monohydrate, (MACROBID) 100 MG capsule Take 1 cap twice per day for 10 days. 09/05/21   McDonough, Si Gaul, PA-C  omeprazole (PRILOSEC) 40 MG capsule Take 1 capsule (40 mg total) by mouth daily. 12/07/20   McDonough, Lauren K, PA-C  rosuvastatin (CRESTOR) 5 MG tablet TAKE 1 TABLET BY MOUTH EVERY DAY 02/27/22   McDonough, Lauren K, PA-C  SYRINGE-NEEDLE, DISP, 3 ML  (B-D 3CC LUER-LOK SYR 25GX1") 25G X 1" 3 ML MISC Use as directed to inject 1 ml of b12 intramuscular once a week for 3 weeks and then once every 30 days  E55.9 09/01/21   McDonough, Si Gaul, PA-C  Vitamin D, Ergocalciferol, (DRISDOL) 1.25 MG (50000 UNIT) CAPS capsule TAKE 1 CAPSULE BY MOUTH ONE TIME PER WEEK 05/25/21   McDonough, Si Gaul, PA-C    Family History  Problem Relation Age of Onset   Heart disease Mother    Hyperlipidemia Mother    Diabetes Father    Cancer Father    Arthritis Sister    Anxiety disorder Sister    Cancer Brother    Arthritis Sister    Arthritis Sister    Breast cancer Neg Hx      Social History   Tobacco Use   Smoking status: Never   Smokeless tobacco: Never  Vaping Use   Vaping Use: Never used  Substance Use Topics   Alcohol use: No   Drug use: No    Allergies as of 03/28/2022 - Review Complete 12/01/2021  Allergen Reaction Noted   Oxycodone  07/04/2019   Tape Rash 11/11/2014    Review of Systems:    All systems reviewed and negative except where noted in HPI.   Physical Exam:  LMP 02/23/2021 (Exact Date)  Patient's last menstrual period was 02/23/2021 (exact date). Psych:  Alert and cooperative. Normal mood and affect. General:   Alert,  Well-developed, well-nourished, pleasant and cooperative in NAD    Neurologic:  Alert and oriented x3;  grossly normal neurologically. Psych:  Alert and cooperative. Normal mood and affect.  Imaging Studies: No results found.  Assessment and Plan:   Judith Gomez is a 51 y.o. y/o female has been referred for banding of her internal hemorrhoids that have prolapsed grade 2.  Status post banding of left lateral column  Follow up in 4 weeks  Dr Jonathon Bellows MD,MRCP(U.K)

## 2022-03-29 ENCOUNTER — Other Ambulatory Visit: Payer: Self-pay | Admitting: Physician Assistant

## 2022-03-29 DIAGNOSIS — M25611 Stiffness of right shoulder, not elsewhere classified: Secondary | ICD-10-CM | POA: Diagnosis not present

## 2022-03-29 DIAGNOSIS — M25511 Pain in right shoulder: Secondary | ICD-10-CM | POA: Diagnosis not present

## 2022-04-04 DIAGNOSIS — Z85828 Personal history of other malignant neoplasm of skin: Secondary | ICD-10-CM | POA: Diagnosis not present

## 2022-04-04 DIAGNOSIS — Z85820 Personal history of malignant melanoma of skin: Secondary | ICD-10-CM | POA: Diagnosis not present

## 2022-04-04 DIAGNOSIS — Z86006 Personal history of melanoma in-situ: Secondary | ICD-10-CM | POA: Diagnosis not present

## 2022-04-04 DIAGNOSIS — D225 Melanocytic nevi of trunk: Secondary | ICD-10-CM | POA: Diagnosis not present

## 2022-04-05 DIAGNOSIS — M25611 Stiffness of right shoulder, not elsewhere classified: Secondary | ICD-10-CM | POA: Diagnosis not present

## 2022-04-05 DIAGNOSIS — M25511 Pain in right shoulder: Secondary | ICD-10-CM | POA: Diagnosis not present

## 2022-04-08 DIAGNOSIS — M7701 Medial epicondylitis, right elbow: Secondary | ICD-10-CM | POA: Diagnosis not present

## 2022-04-11 ENCOUNTER — Encounter (INDEPENDENT_AMBULATORY_CARE_PROVIDER_SITE_OTHER): Payer: Self-pay

## 2022-04-26 ENCOUNTER — Ambulatory Visit: Payer: BLUE CROSS/BLUE SHIELD | Admitting: Gastroenterology

## 2022-04-26 ENCOUNTER — Other Ambulatory Visit: Payer: Self-pay

## 2022-04-26 NOTE — Progress Notes (Deleted)
Patient follow-ups today for banding of hemorrhoids    Summary of history : Previously she has had thrombosed hemorrhoids and has been treated surgically presently feels like she had her hemorrhoid prolapsing and going back inside no bleeding no perianal itching just uncomfortable feeling and she would like to get banded   01/04/2019: Colonoscopy:  Polyps x 2 in ascending colon x sessile serrated polyps  First round:03/28/2022: LL column banded    Interval history  03/28/2022-04/26/2022    Digital rectal exam performed in the presence of a chaperone. External anal findings: *** Internal findings:*** , No masses, no blood on glove noticed.    PROCEDURE NOTE: The patient presents with symptomatic grade {1-4:31454} hemorrhoids, unresponsive to maximal medical therapy, requesting rubber band ligation of his/her hemorrhoidal disease.  All risks, benefits and alternative forms of therapy were described and informed consent was obtained.  In the Left Lateral Decubitus position (if anoscopy is performed) anoscopic examination revealed grade {1-4:31454} hemorrhoids in the {CHL AMB HEMORRHOID POSITION:210130901} position(s).   The decision was made to band the {CHL AMB HEMORRHOID POSITION:210130901} internal hemorrhoid, and the [place] was used to perform band ligation without complication.  Digital anorectal examination was then performed to assure proper positioning of the band, and to adjust the banded tissue as required.  The patient was discharged home without pain or other issues.  Dietary and behavioral recommendations were given and (if necessary - prescriptions were given), along with follow-up instructions.  The patient will return {1-4:31454} {CHL AMB WEEKS/MONTHS/AS NEEDED:210130900} for follow-up and possible additional banding as required.  No complications were encountered and the patient tolerated the procedure well.   Plan:  Avoid constipation.  Commence on stool  softeners if not already on  Follow-up:***  Dr Jonathon Bellows MD,MRCP [facility]) Gastroenterology/Hepatology Pager: [phone]

## 2022-05-02 ENCOUNTER — Other Ambulatory Visit: Payer: Self-pay | Admitting: Internal Medicine

## 2022-05-28 ENCOUNTER — Other Ambulatory Visit: Payer: Self-pay | Admitting: Obstetrics and Gynecology

## 2022-05-28 ENCOUNTER — Other Ambulatory Visit: Payer: Self-pay | Admitting: Physician Assistant

## 2022-05-28 DIAGNOSIS — Z79890 Hormone replacement therapy (postmenopausal): Secondary | ICD-10-CM

## 2022-05-30 NOTE — Telephone Encounter (Signed)
One refill sent in until patient can be seen for annual visit.

## 2022-06-30 ENCOUNTER — Other Ambulatory Visit: Payer: Self-pay | Admitting: Physician Assistant

## 2022-08-03 ENCOUNTER — Other Ambulatory Visit: Payer: Self-pay | Admitting: Physician Assistant

## 2022-08-03 DIAGNOSIS — Z1231 Encounter for screening mammogram for malignant neoplasm of breast: Secondary | ICD-10-CM

## 2022-08-29 ENCOUNTER — Other Ambulatory Visit: Payer: Self-pay | Admitting: Obstetrics and Gynecology

## 2022-08-29 DIAGNOSIS — Z79890 Hormone replacement therapy (postmenopausal): Secondary | ICD-10-CM

## 2022-08-29 NOTE — Telephone Encounter (Signed)
Spoke with patient regarding her need for an annual and her refills. She has scheduled an appointment on 4/22 and has been verbally made aware, did not see MyChart message in December, about no further refills until being seen in office. Patient states understanding.

## 2022-08-30 ENCOUNTER — Ambulatory Visit: Payer: PRIVATE HEALTH INSURANCE

## 2022-09-01 ENCOUNTER — Telehealth: Payer: Self-pay | Admitting: Physician Assistant

## 2022-09-01 ENCOUNTER — Ambulatory Visit (INDEPENDENT_AMBULATORY_CARE_PROVIDER_SITE_OTHER): Payer: Commercial Managed Care - PPO | Admitting: Physician Assistant

## 2022-09-01 ENCOUNTER — Encounter: Payer: Self-pay | Admitting: Physician Assistant

## 2022-09-01 ENCOUNTER — Ambulatory Visit
Admission: RE | Admit: 2022-09-01 | Discharge: 2022-09-01 | Disposition: A | Discharge status: Home / Self Care | Payer: Commercial Managed Care - PPO | Source: Ambulatory Visit | Attending: Physician Assistant | Admitting: Physician Assistant

## 2022-09-01 VITALS — BP 122/79 | HR 63 | Temp 98.4°F | Resp 16 | Ht 68.0 in | Wt 148.0 lb

## 2022-09-01 DIAGNOSIS — Z0001 Encounter for general adult medical examination with abnormal findings: Secondary | ICD-10-CM | POA: Diagnosis not present

## 2022-09-01 DIAGNOSIS — R300 Dysuria: Secondary | ICD-10-CM

## 2022-09-01 DIAGNOSIS — I517 Cardiomegaly: Secondary | ICD-10-CM | POA: Diagnosis not present

## 2022-09-01 DIAGNOSIS — K581 Irritable bowel syndrome with constipation: Secondary | ICD-10-CM

## 2022-09-01 DIAGNOSIS — E782 Mixed hyperlipidemia: Secondary | ICD-10-CM

## 2022-09-01 DIAGNOSIS — Z1231 Encounter for screening mammogram for malignant neoplasm of breast: Secondary | ICD-10-CM | POA: Insufficient documentation

## 2022-09-01 DIAGNOSIS — I77810 Thoracic aortic ectasia: Secondary | ICD-10-CM

## 2022-09-01 DIAGNOSIS — R946 Abnormal results of thyroid function studies: Secondary | ICD-10-CM

## 2022-09-01 DIAGNOSIS — E538 Deficiency of other specified B group vitamins: Secondary | ICD-10-CM

## 2022-09-01 DIAGNOSIS — G4710 Hypersomnia, unspecified: Secondary | ICD-10-CM

## 2022-09-01 DIAGNOSIS — E559 Vitamin D deficiency, unspecified: Secondary | ICD-10-CM

## 2022-09-01 DIAGNOSIS — R5383 Other fatigue: Secondary | ICD-10-CM

## 2022-09-01 MED ORDER — ROSUVASTATIN CALCIUM 5 MG PO TABS: 5.0000 mg | ORAL_TABLET | Freq: Every day | ORAL | 1 refills | Status: DC

## 2022-09-01 MED ORDER — LINACLOTIDE 145 MCG PO CAPS: ORAL_CAPSULE | ORAL | 1 refills | Status: DC

## 2022-09-01 MED ORDER — TETANUS-DIPHTH-ACELL PERTUSSIS 5-2-15.5 LF-MCG/0.5 IM SUSP: 0.5000 mL | Freq: Once | INTRAMUSCULAR | 0 refills | Status: AC

## 2022-09-01 NOTE — Telephone Encounter (Signed)
Lvm notifying patient of echo appointment date, time & location-Toni

## 2022-09-01 NOTE — Telephone Encounter (Signed)
Awaiting 09/01/22 office notes for SS order-Toni

## 2022-09-01 NOTE — Progress Notes (Signed)
[facility] [place], [place] [address]  Internal MEDICINE  Office Visit Note  Patient Name: Judith Gomez  [id number]  [id number]  Date of Service: 09/02/2022  Chief Complaint  Patient presents with   Annual Exam   Anemia   Quality Metric Gaps    TDAP and Shingles     HPI Pt is here for routine health maintenance examination -mammogram is scheduled for today, pap is in April -Also has dentist and eye exams soon -declines shingles vaccine, thinks last tdap awhile ago and will have this updated -Did have instance of pulling left hamstring during pilates warm up. Has been taking it easy since then -Bladder concerns--has urgency to get to restroom, mainly in AM. She is worried about possible prolapse if this progresses as her grandmother went through this and never treated it. She has her pap in April and will have pelvic done then and may mention concerns to gyn to ensure no need for pessary if concern of pelvic organ prolapse. She is doing kegels already and is not interested in medication at this time. She did have 3 out of 4 of her kids vaginally -She is overdue for repeat echo for monitoring and will also reorder sleep study due to left atrial dilation and aortic root dilation in addition to symptoms of snoring and occasionally gasps awake. She did not have this done last year due to cost, but has new insurance and would like to recheck  EPWORTH SLEEPINESS SCALE:  Scale:  (0)= no chance of dozing; (1)= slight chance of dozing; (2)= moderate chance of dozing; (3)= high chance of dozing  Chance  Situtation    Sitting and reading: 0    Watching TV: 0    Sitting Inactive in public: 0    As a passenger in car: 0      Lying down to rest: 3    Sitting and talking: 0    Sitting quielty after lunch: 0    In a car, stopped in traffic: 0   TOTAL SCORE:   3 out of 24   Current Medication: Outpatient Encounter Medications as of 09/01/2022   Medication Sig   cyanocobalamin (,VITAMIN B-12,) 1000 MCG/ML injection Inject 1 ml intramuscular once a week for 3 weeks.  Then inject 1 ml once every 30 days  E55.9   estradiol (ESTRACE) 1 MG tablet TAKE 1 TABLET BY MOUTH EVERY DAY   HYDROcodone-acetaminophen (NORCO/VICODIN) 5-325 MG tablet Take 1 tablet by mouth every 4 (four) hours as needed.   methylPREDNISolone (MEDROL DOSEPAK) 4 MG TBPK tablet Take 4 mg by mouth as directed.   Multiple Vitamin (MULTIVITAMIN) capsule Take 1 capsule by mouth daily.   nitrofurantoin, macrocrystal-monohydrate, (MACROBID) 100 MG capsule Take 1 cap twice per day for 10 days.   omeprazole (PRILOSEC) 40 MG capsule Take 1 capsule (40 mg total) by mouth daily.   SYRINGE-NEEDLE, DISP, 3 ML (B-D 3CC LUER-LOK SYR 25GX1") 25G X 1" 3 ML MISC Use as directed to inject 1 ml of b12 intramuscular once a week for 3 weeks and then once every 30 days  E55.9   Vitamin D, Ergocalciferol, (DRISDOL) 1.25 MG (50000 UNIT) CAPS capsule TAKE 1 CAPSULE BY MOUTH ONE TIME PER WEEK   [DISCONTINUED] LINZESS 145 MCG CAPS capsule TAKE 1 CAPSULE BY MOUTH EVERY DAY BEFORE BREAKFAST   [DISCONTINUED] rosuvastatin (CRESTOR) 5 MG tablet TAKE 1 TABLET BY MOUTH EVERY DAY   [DISCONTINUED] Tdap (ADACEL) 10-12-13.5 LF-MCG/0.5 injection Inject 0.5  mLs into the muscle once.   diclofenac (VOLTAREN) 50 MG EC tablet Take 50 mg by mouth. As needed (Patient not taking: Reported on 09/01/2022)   linaclotide (LINZESS) 145 MCG CAPS capsule TAKE 1 CAPSULE BY MOUTH EVERY DAY BEFORE BREAKFAST   rosuvastatin (CRESTOR) 5 MG tablet Take 1 tablet (5 mg total) by mouth daily.   [EXPIRED] Tdap (ADACEL) 10-12-13.5 LF-MCG/0.5 injection Inject 0.5 mLs into the muscle once for 1 dose.   No facility-administered encounter medications on file as of 09/01/2022.    Surgical History: Past Surgical History:  Procedure Laterality Date   CESAREAN SECTION     COLONOSCOPY     COLONOSCOPY WITH PROPOFOL N/A 01/04/2019   Procedure:  COLONOSCOPY WITH PROPOFOL;  Surgeon: Jonathon Bellows, MD;  Location: [facility] ENDOSCOPY;  Service: Gastroenterology;  Laterality: N/A;   KNEE ARTHROSCOPY Left 11/18/2014   Procedure: ARTHROSCOPY KNEE;  Surgeon: Hessie Knows, MD;  Location: [facility] ORS;  Service: Orthopedics;  Laterality: Left;   partial medial menisectomy   THUMB FUSION Left 2014   TUBAL LIGATION     WISDOM TOOTH EXTRACTION      Medical History: Past Medical History:  Diagnosis Date   Anemia    Arthritis    Cancer ([place])    Chronic kidney disease    Heart murmur     Family History: Family History  Problem Relation Age of Onset   Heart disease Mother    Hyperlipidemia Mother    Diabetes Father    Cancer Father    Arthritis Sister    Anxiety disorder Sister    Cancer Brother    Arthritis Sister    Arthritis Sister    Breast cancer Neg Hx       Review of Systems  Constitutional:  Positive for fatigue. Negative for chills and unexpected weight change.  HENT:  Negative for congestion, postnasal drip, rhinorrhea, sneezing and sore throat.   Eyes:  Negative for redness.  Respiratory:  Negative for cough, chest tightness and shortness of breath.   Cardiovascular:  Negative for chest pain and palpitations.  Gastrointestinal:  Negative for abdominal pain, diarrhea, nausea and vomiting.  Genitourinary:  Positive for urgency. Negative for dysuria and frequency.  Musculoskeletal:  Positive for myalgias. Negative for arthralgias, back pain, joint swelling and neck pain.  Skin:  Negative for rash.  Neurological: Negative.  Negative for tremors and numbness.  Hematological:  Negative for adenopathy. Does not bruise/bleed easily.  Psychiatric/Behavioral:  Positive for sleep disturbance. Negative for behavioral problems (Depression) and suicidal ideas. The patient is not nervous/anxious.      Vital Signs: BP 122/79   Pulse 63   Temp 98.4 F (36.9 C)   Resp 16   Ht 5\' 8"  (1.727 m)   Wt 148 lb (67.1 kg)   LMP 02/23/2021  (Exact Date)   SpO2 99%   BMI 22.50 kg/m    Physical Exam Vitals and nursing note reviewed.  Constitutional:      General: She is not in acute distress.    Appearance: Normal appearance. She is well-developed and normal weight. She is not diaphoretic.  HENT:     Head: Normocephalic and atraumatic.     Mouth/Throat:     Pharynx: No oropharyngeal exudate.  Eyes:     Pupils: Pupils are equal, round, and reactive to light.  Neck:     Thyroid: No thyromegaly.     Vascular: No JVD.     Trachea: No tracheal deviation.  Cardiovascular:  Rate and Rhythm: Normal rate and regular rhythm.     Heart sounds: Normal heart sounds. No murmur heard.    No friction rub. No gallop.  Pulmonary:     Effort: Pulmonary effort is normal. No respiratory distress.     Breath sounds: No wheezing or rales.  Chest:     Chest wall: No tenderness.  Abdominal:     General: Bowel sounds are normal.     Palpations: Abdomen is soft.     Tenderness: There is no abdominal tenderness.  Musculoskeletal:        General: Normal range of motion.     Cervical back: Normal range of motion and neck supple.  Lymphadenopathy:     Cervical: No cervical adenopathy.  Skin:    General: Skin is warm and dry.  Neurological:     Mental Status: She is alert and oriented to person, place, and time.     Cranial Nerves: No cranial nerve deficit.  Psychiatric:        Behavior: Behavior normal.        Thought Content: Thought content normal.        Judgment: Judgment normal.      LABS: Recent Results (from the past 2160 hour(s))  CBC w/Diff/Platelet     Status: Abnormal   Collection Time: 09/01/22  9:57 AM  Result Value Ref Range   WBC 3.2 (L) 3.4 - 10.8 x10E3/uL   RBC 4.03 3.77 - 5.28 x10E6/uL   Hemoglobin 12.5 11.1 - 15.9 g/dL   Hematocrit 37.3 34.0 - 46.6 %   MCV 93 79 - 97 fL   MCH 31.0 26.6 - 33.0 pg   MCHC 33.5 31.5 - 35.7 g/dL   RDW 12.4 11.7 - 15.4 %   Platelets 218 150 - 450 x10E3/uL   Neutrophils  57 Not Estab. %   Lymphs 33 Not Estab. %   Monocytes 9 Not Estab. %   Eos 1 Not Estab. %   Basos 0 Not Estab. %   Neutrophils Absolute 1.8 1.4 - 7.0 x10E3/uL   Lymphocytes Absolute 1.1 0.7 - 3.1 x10E3/uL   Monocytes Absolute 0.3 0.1 - 0.9 x10E3/uL   EOS (ABSOLUTE) 0.0 0.0 - 0.4 x10E3/uL   Basophils Absolute 0.0 0.0 - 0.2 x10E3/uL   Immature Granulocytes 0 Not Estab. %   Immature Grans (Abs) 0.0 0.0 - 0.1 x10E3/uL  Comprehensive metabolic panel     Status: Abnormal   Collection Time: 09/01/22  9:57 AM  Result Value Ref Range   Glucose 103 (H) 70 - 99 mg/dL   BUN 12 6 - 24 mg/dL   Creatinine, Ser 0.71 0.57 - 1.00 mg/dL   eGFR 102 >59 mL/min/1.73   BUN/Creatinine Ratio 17 9 - 23   Sodium 142 134 - 144 mmol/L   Potassium 4.5 3.5 - 5.2 mmol/L   Chloride 105 96 - 106 mmol/L   CO2 25 20 - 29 mmol/L   Calcium 9.5 8.7 - 10.2 mg/dL   Total Protein 6.6 6.0 - 8.5 g/dL   Albumin 4.4 3.8 - 4.9 g/dL   Globulin, Total 2.2 1.5 - 4.5 g/dL   Albumin/Globulin Ratio 2.0 1.2 - 2.2   Bilirubin Total 0.5 0.0 - 1.2 mg/dL   Alkaline Phosphatase 70 44 - 121 IU/L   AST 17 0 - 40 IU/L   ALT 21 0 - 32 IU/L  TSH + free T4     Status: None   Collection Time: 09/01/22  9:57 AM  Result  Value Ref Range   TSH 3.460 0.450 - 4.500 uIU/mL   Free T4 1.24 0.82 - 1.77 ng/dL  Lipid Panel With LDL/HDL Ratio     Status: None   Collection Time: 09/01/22  9:57 AM  Result Value Ref Range   Cholesterol, Total 149 100 - 199 mg/dL   Triglycerides 62 0 - 149 mg/dL   HDL 63 >39 mg/dL   VLDL Cholesterol Cal 13 5 - 40 mg/dL   LDL Chol Calc (NIH) 73 0 - 99 mg/dL   LDL/HDL Ratio 1.2 0.0 - 3.2 ratio    Comment:                                     LDL/HDL Ratio                                             Men  Women                               1/2 Avg.Risk  1.0    1.5                                   Avg.Risk  3.6    3.2                                2X Avg.Risk  6.2    5.0                                3X Avg.Risk   8.0    6.1   VITAMIN D 25 Hydroxy (Vit-D Deficiency, Fractures)     Status: None   Collection Time: 09/01/22  9:57 AM  Result Value Ref Range   Vit D, 25-Hydroxy 38.0 30.0 - 100.0 ng/mL    Comment: Vitamin D deficiency has been defined by the [place] practice guideline as a level of serum 25-OH vitamin D less than 20 ng/mL (1,2). The Endocrine Society went on to further define vitamin D insufficiency as a level between 21 and 29 ng/mL (2). 1. IOM (Institute of Medicine). 2010. Dietary reference    intakes for calcium and D. [place]: The    [employer]. 2. Holick MF, Binkley [place], Bischoff-Ferrari HA, et al.    Evaluation, treatment, and prevention of vitamin D    deficiency: an Endocrine Society clinical practice    guideline. JCEM. 2011 Jul; 96(7):1911-30.   B12 and Folate Panel     Status: None   Collection Time: 09/01/22  9:57 AM  Result Value Ref Range   Vitamin B-12 521 232 - 1,245 pg/mL   Folate 8.4 >3.0 ng/mL    Comment: A serum folate concentration of less than 3.1 ng/mL is considered to represent clinical deficiency.   UA/M w/rflx Culture, Routine     Status: None   Collection Time: 09/01/22  3:58 PM   Specimen: Urine   Urine  Result Value Ref Range   Specific Gravity, UA 1.015 1.005 - 1.030  pH, UA 5.5 5.0 - 7.5   Color, UA Yellow Yellow   Appearance Ur Clear Clear   Leukocytes,UA Negative Negative   Protein,UA Negative Negative/Trace   Glucose, UA Negative Negative   Ketones, UA Negative Negative   RBC, UA Negative Negative   Bilirubin, UA Negative Negative   Urobilinogen, Ur 0.2 0.2 - 1.0 mg/dL   Nitrite, UA Negative Negative   Microscopic Examination Comment     Comment: Microscopic follows if indicated.   Microscopic Examination See below:     Comment: Microscopic was indicated and was performed.   Urinalysis Reflex Comment     Comment: This specimen will not reflex to a Urine Culture.   Microscopic Examination     Status: None   Collection Time: 09/01/22  3:58 PM   Urine  Result Value Ref Range   WBC, UA None seen 0 - 5 /hpf   RBC, Urine 0-2 0 - 2 /hpf   Epithelial Cells (non renal) 0-10 0 - 10 /hpf   Casts None seen None seen /lpf   Bacteria, UA None seen None seen/Few        Assessment/Plan: 1. Encounter for general adult medical examination with abnormal findings Cpe performed, mammogram and pap scheduled, due for labs  2. Dilated aortic root (HCC) Will update echo and check PSG - ECHOCARDIOGRAM COMPLETE; Future - PSG SLEEP STUDY; Future  3. Left atrial dilation Will update echo and check PSG - ECHOCARDIOGRAM COMPLETE; Future - PSG SLEEP STUDY; Future  4. Hypersomnia Due to snoring, gasping, fatigue, left atrial dilation, and dilated aortic root, will need to have PSG - PSG SLEEP STUDY; Future  5. Irritable bowel syndrome with constipation - linaclotide (LINZESS) 145 MCG CAPS capsule; TAKE 1 CAPSULE BY MOUTH EVERY DAY BEFORE BREAKFAST  Dispense: 90 capsule; Refill: 1  6. Abnormal thyroid exam - TSH + free T4  7. B12 deficiency - B12 and Folate Panel  8. Vitamin D deficiency - VITAMIN D 25 Hydroxy (Vit-D Deficiency, Fractures)  9. Mixed hyperlipidemia Continue crestor and will update labs - Lipid Panel With LDL/HDL Ratio  10. Other fatigue - CBC w/Diff/Platelet - Comprehensive metabolic panel - TSH + free T4 - Lipid Panel With LDL/HDL Ratio - VITAMIN D 25 Hydroxy (Vit-D Deficiency, Fractures) - B12 and Folate Panel   General Counseling: Ladedra verbalizes understanding of the findings of todays visit and agrees with plan of treatment. I have discussed any further diagnostic evaluation that may be needed or ordered today. We also reviewed her medications today. she has been encouraged to call the office with any questions or concerns that should arise related to todays visit.    Counseling:    Orders Placed This Encounter   Procedures   Microscopic Examination   CBC w/Diff/Platelet   Comprehensive metabolic panel   TSH + free T4   Lipid Panel With LDL/HDL Ratio   VITAMIN D 25 Hydroxy (Vit-D Deficiency, Fractures)   B12 and Folate Panel   UA/M w/rflx Culture, Routine   ECHOCARDIOGRAM COMPLETE   PSG SLEEP STUDY    Meds ordered this encounter  Medications   linaclotide (LINZESS) 145 MCG CAPS capsule    Sig: TAKE 1 CAPSULE BY MOUTH EVERY DAY BEFORE BREAKFAST    Dispense:  90 capsule    Refill:  1   rosuvastatin (CRESTOR) 5 MG tablet    Sig: Take 1 tablet (5 mg total) by mouth daily.    Dispense:  90 tablet    Refill:  1  Tdap (ADACEL) 10-12-13.5 LF-MCG/0.5 injection    Sig: Inject 0.5 mLs into the muscle once for 1 dose.    Dispense:  0.5 mL    Refill:  0    This patient was seen by Drema Dallas, PA-C in collaboration with Dr. Clayborn Bigness as a part of collaborative care agreement.  Total time spent:35 Minutes  Time spent includes review of chart, medications, test results, and follow up plan with the patient.     Lavera Guise, MD  Internal Medicine

## 2022-09-02 ENCOUNTER — Inpatient Hospital Stay
Admission: RE | Admit: 2022-09-02 | Discharge: 2022-09-02 | Disposition: A | Discharge status: Home / Self Care | Payer: Self-pay | Source: Ambulatory Visit | Attending: *Deleted | Admitting: *Deleted

## 2022-09-02 ENCOUNTER — Other Ambulatory Visit: Payer: Self-pay | Admitting: *Deleted

## 2022-09-02 DIAGNOSIS — Z1231 Encounter for screening mammogram for malignant neoplasm of breast: Secondary | ICD-10-CM

## 2022-09-02 LAB — CBC WITH DIFFERENTIAL/PLATELET
Basophils Absolute: 0 10*3/uL (ref 0.0–0.2)
Basos: 0 %
EOS (ABSOLUTE): 0 10*3/uL (ref 0.0–0.4)
Eos: 1 %
Hematocrit: 37.3 % (ref 34.0–46.6)
Hemoglobin: 12.5 g/dL (ref 11.1–15.9)
Immature Grans (Abs): 0 10*3/uL (ref 0.0–0.1)
Immature Granulocytes: 0 %
Lymphocytes Absolute: 1.1 10*3/uL (ref 0.7–3.1)
Lymphs: 33 %
MCH: 31 pg (ref 26.6–33.0)
MCHC: 33.5 g/dL (ref 31.5–35.7)
MCV: 93 fL (ref 79–97)
Monocytes Absolute: 0.3 10*3/uL (ref 0.1–0.9)
Monocytes: 9 %
Neutrophils Absolute: 1.8 10*3/uL (ref 1.4–7.0)
Neutrophils: 57 %
Platelets: 218 10*3/uL (ref 150–450)
RBC: 4.03 x10E6/uL (ref 3.77–5.28)
RDW: 12.4 % (ref 11.7–15.4)
WBC: 3.2 10*3/uL — ABNORMAL LOW (ref 3.4–10.8)

## 2022-09-02 LAB — MICROSCOPIC EXAMINATION
Bacteria, UA: NONE SEEN
Casts: NONE SEEN /lpf
WBC, UA: NONE SEEN /hpf (ref 0–5)

## 2022-09-02 LAB — COMPREHENSIVE METABOLIC PANEL
ALT: 21 IU/L (ref 0–32)
AST: 17 IU/L (ref 0–40)
Albumin/Globulin Ratio: 2 (ref 1.2–2.2)
Albumin: 4.4 g/dL (ref 3.8–4.9)
Alkaline Phosphatase: 70 IU/L (ref 44–121)
BUN/Creatinine Ratio: 17 (ref 9–23)
BUN: 12 mg/dL (ref 6–24)
Bilirubin Total: 0.5 mg/dL (ref 0.0–1.2)
CO2: 25 mmol/L (ref 20–29)
Calcium: 9.5 mg/dL (ref 8.7–10.2)
Chloride: 105 mmol/L (ref 96–106)
Creatinine, Ser: 0.71 mg/dL (ref 0.57–1.00)
Globulin, Total: 2.2 g/dL (ref 1.5–4.5)
Glucose: 103 mg/dL — ABNORMAL HIGH (ref 70–99)
Potassium: 4.5 mmol/L (ref 3.5–5.2)
Sodium: 142 mmol/L (ref 134–144)
Total Protein: 6.6 g/dL (ref 6.0–8.5)
eGFR: 102 mL/min/{1.73_m2} (ref >59)

## 2022-09-02 LAB — UA/M W/RFLX CULTURE, ROUTINE
Bilirubin, UA: NEGATIVE
Glucose, UA: NEGATIVE
Ketones, UA: NEGATIVE
Leukocytes,UA: NEGATIVE
Nitrite, UA: NEGATIVE
Protein,UA: NEGATIVE
RBC, UA: NEGATIVE
Specific Gravity, UA: 1.015 (ref 1.005–1.030)
Urobilinogen, Ur: 0.2 mg/dL (ref 0.2–1.0)
pH, UA: 5.5 (ref 5.0–7.5)

## 2022-09-02 LAB — LIPID PANEL WITH LDL/HDL RATIO
Cholesterol, Total: 149 mg/dL (ref 100–199)
HDL: 63 mg/dL (ref >39)
LDL Chol Calc (NIH): 73 mg/dL (ref 0–99)
LDL/HDL Ratio: 1.2 ratio (ref 0.0–3.2)
Triglycerides: 62 mg/dL (ref 0–149)
VLDL Cholesterol Cal: 13 mg/dL (ref 5–40)

## 2022-09-02 LAB — B12 AND FOLATE PANEL
Folate: 8.4 ng/mL (ref >3.0)
Vitamin B-12: 521 pg/mL (ref 232–1245)

## 2022-09-02 LAB — TSH+FREE T4
Free T4: 1.24 ng/dL (ref 0.82–1.77)
TSH: 3.46 u[IU]/mL (ref 0.450–4.500)

## 2022-09-02 LAB — VITAMIN D 25 HYDROXY (VIT D DEFICIENCY, FRACTURES): Vit D, 25-Hydroxy: 38 ng/mL (ref 30.0–100.0)

## 2022-09-06 ENCOUNTER — Telehealth: Payer: Self-pay | Admitting: Physician Assistant

## 2022-09-06 NOTE — Telephone Encounter (Signed)
SS order placed in [facility] folder-Toni 

## 2022-09-07 ENCOUNTER — Telehealth: Payer: Self-pay

## 2022-09-07 NOTE — Telephone Encounter (Signed)
-----   Message from Mylinda Latina, PA-C sent at 09/07/2022 12:38 PM EDT ----- Please let pt know that overall labs look ok, her WBC was slightly low and sugar borderline elevated. Cholesterol greatly improved

## 2022-09-07 NOTE — Telephone Encounter (Signed)
Pt notified for labs result  

## 2022-09-26 NOTE — Telephone Encounter (Signed)
Patient scheduled for psg on 09/27/22 @ [facility] .tat 

## 2022-09-27 ENCOUNTER — Encounter (INDEPENDENT_AMBULATORY_CARE_PROVIDER_SITE_OTHER): Payer: Commercial Managed Care - PPO | Admitting: Internal Medicine

## 2022-09-27 DIAGNOSIS — G4719 Other hypersomnia: Secondary | ICD-10-CM

## 2022-09-28 ENCOUNTER — Ambulatory Visit: Admission: RE | Admit: 2022-09-28 | Payer: Commercial Managed Care - PPO | Source: Ambulatory Visit

## 2022-09-29 ENCOUNTER — Telehealth: Payer: Self-pay | Admitting: Physician Assistant

## 2022-09-29 ENCOUNTER — Other Ambulatory Visit: Payer: Self-pay | Admitting: Physician Assistant

## 2022-09-29 DIAGNOSIS — I77810 Thoracic aortic ectasia: Secondary | ICD-10-CM

## 2022-09-29 DIAGNOSIS — I517 Cardiomegaly: Secondary | ICD-10-CM

## 2022-09-29 NOTE — Telephone Encounter (Signed)
Echo order changed to [facility] in [place]. They will contact patient to schedule-Toni

## 2022-09-29 NOTE — Telephone Encounter (Signed)
Patient missed echo appointment yesterday. 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 I s/w [facility]. Sent message to Lauren to change location-Toni

## 2022-10-03 ENCOUNTER — Ambulatory Visit (INDEPENDENT_AMBULATORY_CARE_PROVIDER_SITE_OTHER): Payer: Commercial Managed Care - PPO | Admitting: Obstetrics & Gynecology

## 2022-10-03 ENCOUNTER — Encounter: Payer: Self-pay | Admitting: Obstetrics & Gynecology

## 2022-10-03 ENCOUNTER — Other Ambulatory Visit (HOSPITAL_COMMUNITY)
Admission: RE | Admit: 2022-10-03 | Discharge: 2022-10-03 | Disposition: A | Discharge status: Home / Self Care | Payer: Commercial Managed Care - PPO | Source: Ambulatory Visit | Attending: Obstetrics & Gynecology | Admitting: Obstetrics & Gynecology

## 2022-10-03 VITALS — BP 110/60 | Ht 68.0 in | Wt 148.0 lb

## 2022-10-03 DIAGNOSIS — Z01419 Encounter for gynecological examination (general) (routine) without abnormal findings: Secondary | ICD-10-CM | POA: Diagnosis not present

## 2022-10-03 DIAGNOSIS — Z124 Encounter for screening for malignant neoplasm of cervix: Secondary | ICD-10-CM | POA: Insufficient documentation

## 2022-10-03 DIAGNOSIS — N393 Stress incontinence (female) (male): Secondary | ICD-10-CM

## 2022-10-03 NOTE — Progress Notes (Signed)
GYNECOLOGY ANNUAL PHYSICAL EXAM PROGRESS NOTE  Subjective:    Judith Gomez is a 52 y.o. married [phone]  who presents for an annual exam. She feels like she has some vaginal prolapse along with GSUI. She wears panty liners daily. If she is playing pickleball, she has to wear a heavier pad. She has tried Kegel exercises without help. The patient is sexually active. The patient participates in regular exercise: yes. Has the patient ever been transfused or tattooed?: not asked. The patient reports that there is not domestic violence in her life.    Menstrual History:  Patient's last menstrual period was 02/23/2021 (exact date).     Gynecologic History:  Contraception: IUD, post menopausal status, and tubal ligation History of STI's:  Last Pap: 2022. Results were: ASCUS with negative HR HPV.  Mammogram UTD and normal Colon cancer screening UTD (has polyps)  FH- no breast/gyn cancers, + colon cancer in paternal GF  Mirena placed 2020     OB History  Gravida Para Term Preterm AB Living  0 0 4  SAB IAB Ectopic Multiple Live Births  0 0 0 0 4    # Outcome Date GA Lbr Len/2nd Weight Sex Delivery Anes PTL Lv  4 Term 06/26/08    M CS-Unspec   LIV  3 Term 04/23/02    F Vag-Spont   LIV  2 Term 10/18/99    F Vag-Spont   LIV  1 Term 05/03/97    Rolm Baptise    Past Medical History:  Diagnosis Date   Anemia    Arthritis    Cancer    Chronic kidney disease    Heart murmur     Past Surgical History:  Procedure Laterality Date   CESAREAN SECTION     COLONOSCOPY     COLONOSCOPY WITH PROPOFOL N/A 01/04/2019   Procedure: COLONOSCOPY WITH PROPOFOL;  Surgeon: Wyline Mood, MD;  Location: [facility] ENDOSCOPY;  Service: Gastroenterology;  Laterality: N/A;   KNEE ARTHROSCOPY Left 11/18/2014   Procedure: ARTHROSCOPY KNEE;  Surgeon: Kennedy Bucker, MD;  Location: [facility] ORS;  Service: Orthopedics;  Laterality: Left;   partial medial menisectomy   ROTATOR CUFF REPAIR     THUMB  FUSION Left 06/13/2012   TUBAL LIGATION     WISDOM TOOTH EXTRACTION      Family History  Problem Relation Age of Onset   Heart disease Mother    Hyperlipidemia Mother    Diabetes Father    Cancer Father    Arthritis Sister    Anxiety disorder Sister    Cancer Brother    Arthritis Sister    Arthritis Sister    Breast cancer Neg Hx     Social History   Socioeconomic History   Marital status: Married    Spouse name: Not on file   Number of children: Not on file   Years of education: Not on file   Highest education level: Not on file  Occupational History   Not on file  Tobacco Use   Smoking status: Never   Smokeless tobacco: Never  Vaping Use   Vaping Use: Never used  Substance and Sexual Activity   Alcohol use: No   Drug use: No   Sexual activity: Yes    Birth control/protection: I.U.D.  Other Topics Concern   Not on file  Social History Narrative   Not on file   Social Determinants of Health   Financial Resource Strain: Not on  file  Food Insecurity: Not on file  Transportation Needs: Not on file  Physical Activity: Not on file  Stress: Not on file  Social Connections: Not on file  Intimate Partner Violence: Not on file    Current Outpatient Medications on File Prior to Visit  Medication Sig Dispense Refill   cyanocobalamin (,VITAMIN B-12,) 1000 MCG/ML injection Inject 1 ml intramuscular once a week for 3 weeks.  Then inject 1 ml once every 30 days  E55.9 4 mL 3   estradiol (ESTRACE) 1 MG tablet TAKE 1 TABLET BY MOUTH EVERY DAY 90 tablet 0   linaclotide (LINZESS) 145 MCG CAPS capsule TAKE 1 CAPSULE BY MOUTH EVERY DAY BEFORE BREAKFAST 90 capsule 1   Multiple Vitamin (MULTIVITAMIN) capsule Take 1 capsule by mouth daily.     omeprazole (PRILOSEC) 40 MG capsule Take 1 capsule (40 mg total) by mouth daily. 30 capsule 3   rosuvastatin (CRESTOR) 5 MG tablet Take 1 tablet (5 mg total) by mouth daily. 90 tablet 1   SYRINGE-NEEDLE, DISP, 3 ML (B-D 3CC LUER-LOK SYR  25GX1") 25G X 1" 3 ML MISC Use as directed to inject 1 ml of b12 intramuscular once a week for 3 weeks and then once every 30 days  E55.9 4 each 5   diclofenac (VOLTAREN) 50 MG EC tablet Take 50 mg by mouth. As needed (Patient not taking: Reported on 10/03/2022)     HYDROcodone-acetaminophen (NORCO/VICODIN) 5-325 MG tablet Take 1 tablet by mouth every 4 (four) hours as needed. (Patient not taking: Reported on 10/03/2022)     methylPREDNISolone (MEDROL DOSEPAK) 4 MG TBPK tablet Take 4 mg by mouth as directed. (Patient not taking: Reported on 10/03/2022)     nitrofurantoin, macrocrystal-monohydrate, (MACROBID) 100 MG capsule Take 1 cap twice per day for 10 days. (Patient not taking: Reported on 10/03/2022) 20 capsule 0   Vitamin D, Ergocalciferol, (DRISDOL) 1.25 MG (50000 UNIT) CAPS capsule TAKE 1 CAPSULE BY MOUTH ONE TIME PER WEEK (Patient not taking: Reported on 10/03/2022) 4 capsule 5   No current facility-administered medications on file prior to visit.    Allergies  Allergen Reactions   Oxycodone Other (See Comments)    Pt stated, "Made me feel mean" Other reaction(s): Other Pt stated, "Made me feel mean" Pt stated, "Made me feel mean" Pt stated, "Made me feel mean" Other reaction(s): Other (See Comments) Pt stated, "Made me feel mean" Pt stated, "Made me feel mean" Pt stated, "Made me feel mean" Pt stated, "Made me feel mean"    Tape Rash    Surgical tape used for C-section Surgical tape used for C-section Surgical tape used for C-section Surgical tape used for C-section   Wound Dressing Adhesive Rash    Surgical tape used for C-section Surgical tape used for C-section Surgical tape used for C-section     Review of Systems Constitutional: negative for chills, fatigue, fevers and sweats Eyes: negative for irritation, redness and visual disturbance Ears, nose, mouth, throat, and face: negative for hearing loss, nasal congestion, snoring and tinnitus Respiratory: negative for  asthma, cough, sputum Cardiovascular: negative for chest pain, dyspnea, exertional chest pressure/discomfort, irregular heart beat, palpitations and syncope Gastrointestinal: negative for abdominal pain, change in bowel habits, nausea and vomiting Genitourinary: negative for abnormal menstrual periods, genital lesions, sexual problems and vaginal discharge, dysuria and urinary incontinence Integument/breast: negative for breast lump, breast tenderness and nipple discharge Hematologic/lymphatic: negative for bleeding and easy bruising Musculoskeletal:negative for back pain and muscle weakness Neurological: negative for dizziness,  headaches, vertigo and weakness Endocrine: negative for diabetic symptoms including polydipsia, polyuria and skin dryness Allergic/Immunologic: negative for hay fever and urticaria      Objective:  Blood pressure 110/60, height 5\' 8"  (1.727 m), weight 148 lb (67.1 kg), last menstrual period 02/23/2021. Body mass index is 22.5 kg/m.    General Appearance:    Alert, cooperative, no distress, appears stated age  Head:    Normocephalic, without obvious abnormality, atraumatic  Eyes:    PERRL, conjunctiva/corneas clear, EOM's intact, both eyes  Ears:    Normal external ear canals, both ears  Nose:   Nares normal, septum midline, mucosa normal, no drainage or sinus tenderness  Throat:   Lips, mucosa, and tongue normal; teeth and gums normal  Neck:   Supple, symmetrical, trachea midline, no adenopathy; thyroid: no enlargement/tenderness/nodules; no carotid bruit or JVD  Back:     Symmetric, no curvature, ROM normal, no CVA tenderness  Lungs:     Clear to auscultation bilaterally, respirations unlabored  Chest Wall:    No tenderness or deformity   Heart:    Regular rate and rhythm, S1 and S2 normal, no murmur, rub or gallop  Breast Exam:    No tenderness, masses, or nipple abnormality  Abdomen:     Soft, non-tender, bowel sounds active all four quadrants, no masses, no  organomegaly.    Genitalia:    Pelvic:external genitalia normal, vagina without lesions, discharge, or tenderness, rectovaginal septum  normal. Minimal prolapse, examined in  upright position. Cervix normal in appearance, no cervical motion tenderness, no adnexal masses or tenderness.  Uterus 6 week size c/w fibroids, shape, mobile, regular contours, nontender.  Rectal:    Normal external sphincter.  No hemorrhoids appreciated. Internal exam not done.   Extremities:   Extremities normal, atraumatic, no cyanosis or edema  Pulses:   2+ and symmetric all extremities  Skin:   Skin color, texture, turgor normal, no rashes or lesions  Lymph nodes:   Cervical, supraclavicular, and axillary nodes normal  Neurologic:   CNII-XII intact, normal strength, sensation and reflexes throughout   .  Labs:  Lab Results  Component Value Date   WBC 3.2 (L) 09/01/2022   HGB 12.5 09/01/2022   HCT 37.3 09/01/2022   MCV 93 09/01/2022   PLT 218 09/01/2022    Lab Results  Component Value Date   CREATININE 0.71 09/01/2022   BUN 12 09/01/2022   NA 142 09/01/2022   K 4.5 09/01/2022   CL 105 09/01/2022   CO2 25 09/01/2022    Lab Results  Component Value Date   ALT 21 09/01/2022   AST 17 09/01/2022   ALKPHOS 70 09/01/2022   BILITOT 0.5 09/01/2022    Lab Results  Component Value Date   TSH 3.460 09/01/2022     Assessment:   1. Screening for cervical cancer   2. Well woman exam with routine gynecological exam   3.      GSUI   Plan:   Breast self exam technique reviewed and patient encouraged to perform self-exam monthly. Discussed healthy lifestyle modifications. Pap smear ordered.  She will see Dr. Logan Bores for help with GUSI, possible sling discussed   Allie Bossier, MD [facility]

## 2022-10-04 NOTE — Procedures (Signed)
[facility]  Polysomnogram Report Part I  Phone: [phone] Fax: [phone]  Patient Name: Judith Gomez, Judith Gomez Acquisition Number: [id number]  Date of Birth: November 30, 1970 Acquisition Date: 09/27/2022  Referring Physician: Lynn Ito, PA-C     History: The patient is a 52 year old  who was referred for evaluation of . Medical History: symptoms of OSA, arthritis, CKD,anemia, cancer and heart murmur.excessive daytime sleepiness.  Medications: rosuvastatin, linaclotide, diclofenac, vitamin D, omeprazole, Macrobid, Multivitamin, methylprednisolone, estradiol, vitamin B12 and hydrocodone-acetaminophen.  Procedure: This routine overnight polysomnogram was performed on the [facility] 5 using the standard diagnostic protocol. This included 6 channels of EEG, 2 channels of EOG, chin EMG, bilateral anterior tibialis EMG, nasal/oral thermistor, PTAF (nasal pressure transducer), chest and abdominal wall movements, EKG, and pulse oximetry.  Description: The total recording time was 402.0 minutes. The total sleep time was 372.0 minutes. There were a total of 26.8 minutes of wakefulness after sleep onset for a goodsleep efficiency of 92.5%. The latency to sleep onset was shortat 3.2 minutes. The R sleep onset latency was within normal limits at 76.5 minutes. Sleep parameters, as a percentage of the total sleep time, demonstrated 13.0% of sleep was in N1 sleep, 45.6% N2, 19.0% N3 and 22.4% R sleep. There were a total of 209 arousals for an arousal index of 33.7 arousals per hour of sleep that waselevated.  Respiratory monitoring demonstrated occasional, mild to moderate snoring. There were no obstructive apneas, hypopneas or respiratory effort related arousals (RERAs) observed during the recording.  The baseline oxygen saturation during wakefulness was 96%, during NREM sleep averaged 95%, and during REM sleep averaged 95%.  The total duration of oxygen < 90% was 0.0 minutes.  Cardiac monitoring There were  no significant cardiac rhythm irregularities.   Periodic limb movement monitoring did not demonstrate periodic limb movements.   Impression:  routine overnight polysomnogram did not demonstrate obstructive sleep apnea. There were no obstructive apneas, hypopneas or significant RERAs during the recording.     Sleep efficiency was good and sleep progressed normally through all stages. There was an elevated arousal index withincreased awakenings.  Recommendation CPAP titration is not indicated based on current guidelines   Yevonne Pax, MD, [facility] Diplomate ABMS-Pulmonary, Critical Care and Sleep Medicine  Electronically reviewed and digitally signed   [facility] Polysomnogram Report Part II  Phone: [phone] Fax: [phone]  Patient last name [facility] Neck Size 13.0 in. Acquisition [id number]  Patient first name Judith Gomez Judith Gomez 145.0 lbs. Started 09/27/2022 at 10:43:01 PM  Birth date 01/18/1971 Height 68.0 in. Stopped 09/28/2022 at 6:31:01 AM  Age 24 BMI 22.0 lb/in2 Duration 402.0  Study Type Adult      Report generated by Otho Perl, RPSGT  Reviewed by: Valentino Hue. Henke, PhD, ABSM, FAASM Sleep Data: Lights Out: 10:52:19 PM Sleep Onset: 10:55:31 PM  Lights On: 5:34:19 AM Sleep Efficiency: 92.5 %  Total Recording Time: 402.0 min Sleep Latency (from Lights Off) 3.2 min  Total Sleep Time (TST): 372.0 min R Latency (from Sleep Onset): 76.5 min  Sleep Period Time: 398.5 min Total number of awakenings: 20  Wake during sleep: 26.5 min Wake After Sleep Onset (WASO): 26.8 min   Sleep Data:         Arousal Summary: Stage  Latency from lights out (min) Latency from sleep onset (min) Duration (min) % Total Sleep Time  Normal values  N 1 3.2 0.0 48.5 13.0 (5%)  N 2 11.7 8.5 169.5 45.6 (50%)  N  3 22.7 19.5 70.5 19.0 (20%)  R 79.7 76.5 83.5 22.4 (25%)   Number Index  Spontaneous 202 32.6  Apneas & Hypopneas 0 0.0  RERAs 0 0.0       (Apneas & Hypopneas & RERAs)  (0) (0.0)   Limb Movement 7 1.1  Snore 0 0.0  TOTAL 209 33.7     Respiratory Data:  CA OA MA Apnea Hypopnea* A+ H RERA Total  Number 0 0 0 0 0 0 0 0  Mean Dur (sec) 0.0 0.0 0.0 0.0 0.0 0.0 0.0 0.0  Max Dur (sec) 0.0 0.0 0.0 0.0 0.0 0.0 0.0 0.0  Total Dur (min) 0.0 0.0 0.0 0.0 0.0 0.0 0.0 0.0  % of TST 0.0 0.0 0.0 0.0 0.0 0.0 0.0 0.0  Index (#/h TST) 0.0 0.0 0.0 0.0 0.0 0.0 0.0 0.0  *Hypopneas scored based on 4% or greater desaturation.  Sleep Stage:        REM NREM TST  AHI 0.0 0.0 0.0  RDI 0.0 0.0 0.0           Body Position Data:  Sleep (min) TST (%) REM (min) NREM (min) CA (#) OA (#) MA (#) HYP (#) AHI (#/h) RERA (#) RDI (#/h) Desat (#)  Supine 244.6 65.75 77.0 167.6 0 0 0 0 0.0 0 0.00 2  Non-Supine 127.40 34.25 6.50 120.90 0.00 0.00 0.00 0.00 0.00 0 0.00 3.00  Right: 127.4 34.25 6.5 120.9 0 0 0 0 0.0 0 0.00 3     Snoring: Total number of snoring episodes  0  Total time with snoring    min (   % of sleep)   Oximetry Distribution:             WK REM NREM TOTAL  Average (%)   96 95 95 95  < 90% 0.0 0.0 0.0 0.0  < 80% 0.0 0.0 0.0 0.0  < 70% 0.0 0.0 0.0 0.0  # of Desaturations* Desat Index (#/hour) 2.0 0.7 0.6 0.8  Desat Max (%) Desat Max Dur (sec) 61.0 18.0 31.0 61.0  Approx Min O2 during sleep 92  Approx min O2 during a respiratory event     Was Oxygen added (Y/N) and final rate :    LPM  *Desaturations based on 3% or greater drop from baseline.   Cheyne Stokes Breathing: None Present   Heart Rate Summary:  Average Heart Rate During Sleep 47.2 bpm      Highest Heart Rate During Sleep (95th %) 68.0 bpm      Highest Heart Rate During Sleep 255 bpm (artifact)  Highest Heart Rate During Recording (TIB) 255 bpm (artifact)   Heart Rate Observations: Event Type # Events   Bradycardia 0 Lowest HR Scored: N/A  Sinus Tachycardia During Sleep 0 Highest HR Scored: N/A  Narrow Complex Tachycardia 0 Highest HR Scored: N/A  Wide Complex  Tachycardia 0 Highest HR Scored: N/A  Asystole 0 Longest Pause: N/A  Atrial Fibrillation 0 Duration Longest Event: N/A  Other Arrythmias   Type:    Periodic Limb Movement Data: (Primary legs unless otherwise noted) Total # Limb Movement 21 Limb Movement Index 3.4  Total # PLMS    PLMS Index     Total # PLMS Arousals    PLMS Arousal Index     Percentage Sleep Time with PLMS   min (   % sleep)  Mean Duration limb movements (secs)

## 2022-10-05 ENCOUNTER — Telehealth: Payer: Self-pay

## 2022-10-07 LAB — CYTOLOGY - PAP
Adequacy: ABSENT
Comment: NEGATIVE
Diagnosis: NEGATIVE
High risk HPV: NEGATIVE

## 2022-10-12 NOTE — Telephone Encounter (Signed)
Done

## 2022-10-13 ENCOUNTER — Other Ambulatory Visit: Payer: Self-pay | Admitting: Physician Assistant

## 2022-10-15 ENCOUNTER — Other Ambulatory Visit: Payer: Self-pay | Admitting: Physician Assistant

## 2022-10-25 ENCOUNTER — Ambulatory Visit: Admit: 2022-10-25 | Payer: PRIVATE HEALTH INSURANCE

## 2022-10-25 DIAGNOSIS — Z01419 Encounter for gynecological examination (general) (routine) without abnormal findings: Secondary | ICD-10-CM

## 2022-10-25 NOTE — Progress Notes (Signed)
Annual Wellness Exam and Personalized Prevention Plan    History  Julie Molina is a 52 y.o. [phone] + more! 2 more special needs adoptions in the past 3 years. here today for an Annual Wellness Exam. Last gyn exam and pap 2022. No h/o abn pap. Mmg 11/2021 scattered density. Vmsx and no period for the last 6 motnhs. But recent rsolution. Sleep unaffected ( lorazepam). Add worse. Tolerable vmsx. Denies vaginal or urinary complaints.     Patient Active Problem List   Diagnosis    Hypercholesteremia    OSA (obstructive sleep apnea)    Anxiety         Patient's Medical and Surgical History:      Past Medical History:   Diagnosis Date    Anxiety     Depression     Melanoma (CMS-HCC)        Past Surgical History:   Procedure Laterality Date    CESAREAN SECTION  1997    ear spur  1998    right     Family History   Problem Relation Age of Onset    Cancer Mother         lung cancer    Alcohol abuse Sister     Alcohol abuse Brother     Cirrhosis Brother     Heart disease Maternal Grandmother     Heart disease Maternal Grandfather        Patient Care Team:  Darden Dates, MD as PCP - General (General Practice)      Medications   Home Meds the Patient Reports Taking   Medication Sig    ascorbic acid, vitamin C, (VITAMIN C) 500 MG tablet Take 1 tablet (500 mg total) by mouth daily.    CALCIUM CARBONATE/VITAMIN D3 (CALCIUM 500 + D ORAL) Take by mouth.    creatine monohydrate (CREATINE ORAL) Take by mouth.    ergocalciferol (ERGOCALCIFEROL) 1,250 mcg (50,000 unit) capsule Take 1 capsule (50,000 Units total) by mouth once a week.    escitalopram (LEXAPRO) 20 MG tablet Take 1 tablet (20 mg total) by mouth daily.    lactobacillus rhamnosus, GG, (CULTURELLE) 10 billion cell capsule Take 1 capsule by mouth daily.    lisdexamfetamine (VYVANSE) 40 MG capsule Take 1 capsule (40 mg total) by mouth every morning.    LORazepam (ATIVAN) 0.5 MG tablet Take 1 tablet (0.5 mg total) by mouth 3 times a day as needed for Anxiety. Indications:  anxiety, insomnia    metFORMIN (GLUCOPHAGE-XR) 500 MG 24 hr tablet     vit b complex w-b 12 tablet Take 1 tablet by mouth daily.    zinc gluconate 50 mg tablet Take 1 tablet (50 mg total) by mouth daily.       Preventive Health Review    There is no immunization history on file for this patient.    Cancer Screening            Overdue - Colorectal Cancer Screening (MyChart) (View Topic Details) Never done      No completion history exists for this topic.              Overdue - Mammogram Product/process development scientist) (Every 2 Years) Order placed this encounter      No completion history exists for this topic.              Cervical Cancer Screening/Pap Smear (MyChart) (Every 5 Years) Next due on 11/17/2025      11/17/2020  CYTOLOGY - THIN  PREP PAP TEST (ONLY)    04/30/2018  Cytology-ThinPrep Pap Test    09/25/2012  Pap Smear, Pathology    03/30/2010  Pap Smear, Pathology    02/20/2007  Pap Smear, Pathology    Only the first 5 history entries have been loaded, but more history exists.                    Review of Systems  General: Weight is stable. Appetite good. Energy good. Exercise -   Skin: No rashes or lesions of concern currently.   Vision: No vision loss or related problems.  Hearing: Hearing is normal. No pain.   Breast: No lumps noted.  Chest: No shortness-of-breath or cough.  Heart: Exercise tolerance normal. No chest pain or pressure.   Abdomen: Swallowing is normal - no regular heartburn. No abdominal pain. No change in bowel habits or bleeding.   Urinary: Normal voiding. Continent.  Gyne: No abnormal vaginal bleeding or discharge.   Ortho: No major joint pains or disability.   Neuro: Normal strength, sensation, and coordination.     Exam  Vitals:    10/25/22 0947   BP: 130/70   BP Location: Right upper arm   Patient Position: Sitting   BP Cuff Size: Regular   Pulse: 58   SpO2: 98%   Weight: 153 lb (69.4 kg)   Height: 5' 2 (1.575 m)     Body mass index is 27.98 kg/m.  Patient's last menstrual period was 01/20/2022.    Physical  Exam  Vitals reviewed. Exam conducted with a chaperone present.   Constitutional:       Appearance: Normal appearance. She is well-developed and normal weight.   HENT:      Head: Normocephalic.   Cardiovascular:      Rate and Rhythm: Normal rate and regular rhythm.      Heart sounds: Normal heart sounds.   Pulmonary:      Effort: Pulmonary effort is normal. No respiratory distress.      Breath sounds: Normal breath sounds.      Comments: Breasts: breasts appear normal, no suspicious masses, no skin or nipple changes or axillary nodes    Abdominal:      Palpations: Abdomen is soft. There is no mass.      Tenderness: There is no abdominal tenderness.      Hernia: No hernia is present.   Genitourinary:     Comments: Pelvic exam: normal external genitalia, vulva, vagina, cervix, uterus and adnexa. No atrophy. No prolapse. chaperone r kirko      Musculoskeletal:         General: Normal range of motion.      Cervical back: Normal range of motion.   Skin:     General: Skin is warm and dry.   Neurological:      Mental Status: She is alert and oriented to person, place, and time.      Cranial Nerves: No cranial nerve deficit.      Coordination: Coordination normal.   Psychiatric:         Behavior: Behavior normal.         Thought Content: Thought content normal.         Judgment: Judgment normal.       Assessment and Plan  Patient counseling and personalized prevention plan - See after visit summary given to the patient.  gynecologic exam: Annual gynecological exam performed. We have reviewed her personal and familial risks for future disease  and discussed appropriate lifestyle choices to avoid these risks. We have reviewed recommended health screenings for her age group and ordered those that are appropriate. We have reviewed her current medications, and discussed the appropriateness of continuing those meds we prescribe. patient will come back in a year unless there are new symptoms.   BSE reviewed and recommended .  Reviewed calcium needs, exercise, and prevention of osteoporosis . Periodic colonoscopy screening recommended . Reviewed normal menopause and menopausal symptoms . Mammogram recommended yearly .  Screening for  cervical cancer. Pap done 2022. Due 2025   Screening for breast cancer. mmg yearly   Cscope due. Referral placed.   Discussed the changes consistent with the menopausal transition. We discussed that some months there are periods, and some there are not. We discussed that eventually, the number of skipped periods will out weight the number of periods. Absence of vasomotor syptoms at this point is a good indicator that they will not be too troublesome, but they can occur in the future. This is a 3-7 year process. If vasomotor symptoms are prominent, it is a poor prognosis indicator, as thins will only get worse. Menstrual predictability is gone. The process I not complete 9 and birth control still needs to be practice ) until 1 whole year with out menses.         Myrlene Broker, MD  10/25/2022

## 2022-12-20 ENCOUNTER — Other Ambulatory Visit: Payer: Self-pay

## 2022-12-20 DIAGNOSIS — Z79890 Hormone replacement therapy (postmenopausal): Secondary | ICD-10-CM

## 2022-12-29 ENCOUNTER — Other Ambulatory Visit: Payer: Self-pay

## 2022-12-29 DIAGNOSIS — K219 Gastro-esophageal reflux disease without esophagitis: Secondary | ICD-10-CM

## 2022-12-29 MED ORDER — OMEPRAZOLE 40 MG PO CPDR: 40.0000 mg | DELAYED_RELEASE_CAPSULE | Freq: Every day | ORAL | 3 refills | Status: DC

## 2023-01-18 ENCOUNTER — Encounter

## 2023-01-18 ENCOUNTER — Inpatient Hospital Stay: Admit: 2023-01-18 | Payer: BLUE CROSS/BLUE SHIELD | Primary: Family Medicine

## 2023-01-18 DIAGNOSIS — Z1231 Encounter for screening mammogram for malignant neoplasm of breast: Secondary | ICD-10-CM

## 2023-02-15 ENCOUNTER — Other Ambulatory Visit: Payer: Self-pay

## 2023-02-15 DIAGNOSIS — E782 Mixed hyperlipidemia: Secondary | ICD-10-CM

## 2023-02-15 MED ORDER — ROSUVASTATIN CALCIUM 5 MG PO TABS: 5.0000 mg | ORAL_TABLET | Freq: Every day | ORAL | 1 refills | Status: DC

## 2023-04-06 ENCOUNTER — Other Ambulatory Visit: Payer: Self-pay | Admitting: Physician Assistant

## 2023-04-06 DIAGNOSIS — K581 Irritable bowel syndrome with constipation: Secondary | ICD-10-CM

## 2023-04-24 ENCOUNTER — Ambulatory Visit
Admission: EM | Admit: 2023-04-24 | Discharge: 2023-04-24 | Disposition: A | Discharge status: Home / Self Care | Payer: Commercial Managed Care - PPO | Attending: Family Medicine | Admitting: Family Medicine

## 2023-04-24 DIAGNOSIS — J0100 Acute maxillary sinusitis, unspecified: Secondary | ICD-10-CM | POA: Diagnosis not present

## 2023-04-24 MED ORDER — AMOXICILLIN-POT CLAVULANATE 875-125 MG PO TABS: 1.0000 | ORAL_TABLET | Freq: Two times a day (BID) | ORAL | 0 refills | Status: AC

## 2023-04-24 MED ORDER — PREDNISONE 20 MG PO TABS: 40.0000 mg | ORAL_TABLET | Freq: Every day | ORAL | 0 refills | Status: AC

## 2023-04-24 NOTE — Discharge Instructions (Signed)
You were seen today for a sinus infection.  I have sent out an antibiotic and steroid for your symptoms.  You may continue tylenol or motrin for pain.  Please follow up if not improving or worsening.

## 2023-04-24 NOTE — ED Triage Notes (Signed)
Reports nasal drainage, headache, sore throat, left ear pain, and facial pressure x 8 days

## 2023-04-24 NOTE — ED Provider Notes (Signed)
[facility]    CSN: [id number] Arrival date & time: 04/24/23  1123      History   Chief Complaint Chief Complaint  Patient presents with   facial pressure    HPI Judith Gomez is a 52 y.o. female.   Patient is here for possible sinus infection.  She has had 8 days of headache, at the face, mostly under her eyes.  Having pressure.  Then with left ear pain, sinus drainage, sore throat.  Having sinus congestion as well.  She is using otc meds with little help.        Past Medical History:  Diagnosis Date   Anemia    Arthritis    Cancer (HCC)    Chronic kidney disease    Heart murmur     Patient Active Problem List   Diagnosis Date Noted   Varicose veins of both lower extremities with inflammation 12/11/2019   Pain in both lower extremities 11/05/2019   Baker's cyst of knee, unspecified laterality 11/05/2019   Irritable bowel syndrome with constipation 11/05/2019   Encounter for general adult medical examination with abnormal findings 08/19/2019   Chronic right shoulder pain 08/19/2019   Acute otitis media 11/19/2018   Screening for colon cancer 08/14/2018   Dysuria 08/14/2018   Vitamin D deficiency 08/14/2018   Uterine leiomyoma 07/04/2018   Urinary tract infection with hematuria 04/05/2018   Acute non-recurrent sinusitis 04/05/2018   Acute low back pain 04/05/2018   Chest pain 01/17/2018   Generalized anxiety disorder 01/17/2018   Acute otitis externa of both ears 01/17/2018   Subacromial bursitis of right shoulder joint 12/15/2017   Pain in right hand 09/18/2017   Primary osteoarthritis of hands, bilateral 08/27/2017   Melanoma (HCC) 01/10/2017   Morton's neuroma 01/17/2014   Multiple renal cysts 01/17/2014   Psoriatic arthritis (HCC) 01/17/2014    Past Surgical History:  Procedure Laterality Date   CESAREAN SECTION     COLONOSCOPY     COLONOSCOPY WITH PROPOFOL N/A 01/04/2019   Procedure: COLONOSCOPY WITH PROPOFOL;  Surgeon: Wyline Mood, MD;  Location: [facility] ENDOSCOPY;  Service: Gastroenterology;  Laterality: N/A;   KNEE ARTHROSCOPY Left 11/18/2014   Procedure: ARTHROSCOPY KNEE;  Surgeon: Kennedy Bucker, MD;  Location: [facility] ORS;  Service: Orthopedics;  Laterality: Left;   partial medial menisectomy   ROTATOR CUFF REPAIR     THUMB FUSION Left 06/13/2012   TUBAL LIGATION     WISDOM TOOTH EXTRACTION      OB History     Gravida  4   Para  4   Term  4   Preterm      AB      Living  4      SAB      IAB      Ectopic      Multiple      Live Births  4            Home Medications    Prior to Admission medications   Medication Sig Start Date End Date Taking? Authorizing Provider  cyanocobalamin (VITAMIN B12) 1000 MCG/ML injection INJECT 1 ML INTRAMUSCULAR ONCE A WEEK FOR 3 WEEKS. THEN INJECT 1 ML ONCE EVERY 30 DAYS E55.9 10/17/22   McDonough, Salomon Fick, PA-C  diclofenac (VOLTAREN) 50 MG EC tablet Take 50 mg by mouth. As needed Patient not taking: Reported on 10/03/2022    [provider]  estradiol (ESTRACE) 1 MG tablet TAKE 1 TABLET BY MOUTH EVERY DAY  12/20/22   Linzie Collin, MD  HYDROcodone-acetaminophen (NORCO/VICODIN) 5-325 MG tablet Take 1 tablet by mouth every 4 (four) hours as needed. Patient not taking: Reported on 10/03/2022 12/16/21   [provider]  levonorgestrel (MIRENA) 20 MCG/DAY IUD 1 each by Intrauterine route once.    [provider]  LINZESS 145 MCG CAPS capsule TAKE 1 CAPSULE BY MOUTH EVERY DAY BEFORE BREAKFAST 04/06/23   McDonough, Lauren K, PA-C  methylPREDNISolone (MEDROL DOSEPAK) 4 MG TBPK tablet Take 4 mg by mouth as directed. Patient not taking: Reported on 10/03/2022 04/20/22   [provider]  Multiple Vitamin (MULTIVITAMIN) capsule Take 1 capsule by mouth daily.    [provider]  nitrofurantoin, macrocrystal-monohydrate, (MACROBID) 100 MG capsule Take 1 cap twice per day for 10 days. Patient not taking: Reported on 10/03/2022  09/05/21   Carlean Jews, PA-C  omeprazole (PRILOSEC) 40 MG capsule Take 1 capsule (40 mg total) by mouth daily. 12/29/22   McDonough, Salomon Fick, PA-C  rosuvastatin (CRESTOR) 5 MG tablet Take 1 tablet (5 mg total) by mouth daily. 02/15/23   McDonough, Lauren K, PA-C  SYRINGE-NEEDLE, DISP, 3 ML (BD INTEGRA SYRINGE) 25G X 1" 3 ML MISC USE AS DIRECTED TO INJECT 1 ML OF B12 INTRAMUSCULAR ONCE A WEEK FOR 3 WEEKS AND THEN ONCE EVERY 30 DAYS E55.9 10/14/22   McDonough, Salomon Fick, PA-C  Vitamin D, Ergocalciferol, (DRISDOL) 1.25 MG (50000 UNIT) CAPS capsule TAKE 1 CAPSULE BY MOUTH ONE TIME PER WEEK Patient not taking: Reported on 10/03/2022 05/25/21   Carlean Jews, PA-C    Family History Family History  Problem Relation Age of Onset   Heart disease Mother    Hyperlipidemia Mother    Diabetes Father    Cancer Father    Arthritis Sister    Anxiety disorder Sister    Cancer Brother    Arthritis Sister    Arthritis Sister    Breast cancer Neg Hx     Social History Social History   Tobacco Use   Smoking status: Never   Smokeless tobacco: Never  Vaping Use   Vaping status: Never Used  Substance Use Topics   Alcohol use: No   Drug use: No     Allergies   Oxycodone, Tape, and Wound dressing adhesive   Review of Systems Review of Systems  Constitutional: Negative.   HENT:  Positive for congestion, rhinorrhea, sinus pressure, sinus pain and sore throat.   Respiratory: Negative.    Cardiovascular: Negative.   Gastrointestinal: Negative.   Musculoskeletal: Negative.   Psychiatric/Behavioral: Negative.       Physical Exam Triage Vital Signs ED Triage Vitals  Encounter Vitals Group     BP 04/24/23 1204 137/84     Systolic BP Percentile --      Diastolic BP Percentile --      Pulse Rate 04/24/23 1204 63     Resp 04/24/23 1204 18     Temp 04/24/23 1204 98.1 F (36.7 C)     Temp Source 04/24/23 1204 Oral     SpO2 04/24/23 1204 97 %     Weight --      Height --      Head  Circumference --      Peak Flow --      Pain Score 04/24/23 1205 4     Pain Loc --      Pain Education --      Exclude from Growth Chart --  No data found.  Updated Vital Signs BP 137/84 (BP Location: Left Arm)   Pulse 63   Temp 98.1 F (36.7 C) (Oral)   Resp 18   LMP 02/23/2021 (Exact Date)   SpO2 97%   Visual Acuity Right Eye Distance:   Left Eye Distance:   Bilateral Distance:    Right Eye Near:   Left Eye Near:    Bilateral Near:     Physical Exam Constitutional:      Appearance: Normal appearance.  HENT:     Right Ear: Tympanic membrane normal.     Left Ear: A middle ear effusion is present.     Nose:     Right Sinus: Maxillary sinus tenderness and frontal sinus tenderness present.     Left Sinus: Maxillary sinus tenderness and frontal sinus tenderness present.     Mouth/Throat:     Pharynx: Posterior oropharyngeal erythema present. No pharyngeal swelling or oropharyngeal exudate.  Cardiovascular:     Rate and Rhythm: Normal rate and regular rhythm.  Pulmonary:     Effort: Pulmonary effort is normal.     Breath sounds: Normal breath sounds.  Musculoskeletal:     Cervical back: Normal range of motion and neck supple. No tenderness.  Neurological:     General: No focal deficit present.     Mental Status: She is alert.  Psychiatric:        Mood and Affect: Mood normal.      UC Treatments / Results  Labs (all labs ordered are listed, but only abnormal results are displayed) Labs Reviewed - No data to display  EKG   Radiology No results found.  Procedures Procedures (including critical care time)  Medications Ordered in UC Medications - No data to display  Initial Impression / Assessment and Plan / UC Course  I have reviewed the triage vital signs and the nursing notes.  Pertinent labs & imaging results that were available during my care of the patient were reviewed by me and considered in my medical decision making (see chart for  details).    Final Clinical Impressions(s) / UC Diagnoses   Final diagnoses:  Acute non-recurrent maxillary sinusitis     Discharge Instructions      You were seen today for a sinus infection.  I have sent out an antibiotic and steroid for your symptoms.  You may continue tylenol or motrin for pain.  Please follow up if not improving or worsening.     ED Prescriptions     Medication Sig Dispense Auth. Provider   amoxicillin-clavulanate (AUGMENTIN) 875-125 MG tablet Take 1 tablet by mouth every 12 (twelve) hours for 10 days. 20 tablet Wilfrido Luedke, MD   predniSONE (DELTASONE) 20 MG tablet Take 2 tablets (40 mg total) by mouth daily for 5 days. 10 tablet Jannifer Franklin, MD      PDMP not reviewed this encounter.   Jannifer Franklin, MD 04/24/23 1215

## 2023-09-04 ENCOUNTER — Encounter: Payer: Self-pay | Admitting: Physician Assistant

## 2023-09-04 ENCOUNTER — Ambulatory Visit (INDEPENDENT_AMBULATORY_CARE_PROVIDER_SITE_OTHER): Payer: Commercial Managed Care - PPO | Admitting: Physician Assistant

## 2023-09-04 VITALS — BP 128/79 | HR 64 | Temp 98.0°F | Resp 16 | Ht 68.0 in | Wt 150.0 lb

## 2023-09-04 DIAGNOSIS — I517 Cardiomegaly: Secondary | ICD-10-CM | POA: Diagnosis not present

## 2023-09-04 DIAGNOSIS — E538 Deficiency of other specified B group vitamins: Secondary | ICD-10-CM

## 2023-09-04 DIAGNOSIS — E782 Mixed hyperlipidemia: Secondary | ICD-10-CM

## 2023-09-04 DIAGNOSIS — K581 Irritable bowel syndrome with constipation: Secondary | ICD-10-CM

## 2023-09-04 DIAGNOSIS — Z1231 Encounter for screening mammogram for malignant neoplasm of breast: Secondary | ICD-10-CM

## 2023-09-04 DIAGNOSIS — Z0001 Encounter for general adult medical examination with abnormal findings: Secondary | ICD-10-CM | POA: Diagnosis not present

## 2023-09-04 DIAGNOSIS — M79604 Pain in right leg: Secondary | ICD-10-CM

## 2023-09-04 DIAGNOSIS — I77810 Thoracic aortic ectasia: Secondary | ICD-10-CM

## 2023-09-04 NOTE — Progress Notes (Signed)
 [facility] [address] [place], [place] [address]  Internal MEDICINE  Office Visit Note  Patient Name: Judith Gomez  [id number]  [id number]  Date of Service: 09/04/2023  Chief Complaint  Patient presents with   Anemia   Arthritis   Pain    Right hip     HPI Pt is here for routine health maintenance examination -Folowed by GYN, had pap last year. On estradiol now and has IUD. Has another follow up next month -Will be due to for mammo--ordered -yesterday was watching march madness, had right foot propped up and didn't move for a few hours and now right hip bothersome. Started to feel better today, but still hurts. Had been doing yard work and does exercise as well prior to this. Feels like it is more IT band and will try stretching. Advised to call if not improving -Never heard from [facility] for echo, will reorder -PSG did not show OSA last year -MRI done on elbow since last visit, has a tear, followed back up with [facility] and had another injection and they plan to do PRP in future -linzess doing well -due for labs, would like to recheck iron as long hx of low iron but can't tolerate oral and would be interested in infusions if low again. Would like to continue B12 injections but will update labs first--slip given  Current Medication: Outpatient Encounter Medications as of 09/04/2023  Medication Sig   cyanocobalamin (VITAMIN B12) 1000 MCG/ML injection INJECT 1 ML INTRAMUSCULAR ONCE A WEEK FOR 3 WEEKS. THEN INJECT 1 ML ONCE EVERY 30 DAYS E55.9   estradiol (ESTRACE) 1 MG tablet TAKE 1 TABLET BY MOUTH EVERY DAY   levonorgestrel (MIRENA) 20 MCG/DAY IUD 1 each by Intrauterine route once.   LINZESS 145 MCG CAPS capsule TAKE 1 CAPSULE BY MOUTH EVERY DAY BEFORE BREAKFAST   Multiple Vitamin (MULTIVITAMIN) capsule Take 1 capsule by mouth daily.   omeprazole (PRILOSEC) 40 MG capsule Take 1 capsule (40 mg total) by mouth daily.   rosuvastatin (CRESTOR) 5 MG tablet  Take 1 tablet (5 mg total) by mouth daily.   SYRINGE-NEEDLE, DISP, 3 ML (BD INTEGRA SYRINGE) 25G X 1" 3 ML MISC USE AS DIRECTED TO INJECT 1 ML OF B12 INTRAMUSCULAR ONCE A WEEK FOR 3 WEEKS AND THEN ONCE EVERY 30 DAYS E55.9   No facility-administered encounter medications on file as of 09/04/2023.    Surgical History: Past Surgical History:  Procedure Laterality Date   CESAREAN SECTION     COLONOSCOPY     COLONOSCOPY WITH PROPOFOL N/A 01/04/2019   Procedure: COLONOSCOPY WITH PROPOFOL;  Surgeon: Wyline Mood, MD;  Location: [facility] ENDOSCOPY;  Service: Gastroenterology;  Laterality: N/A;   KNEE ARTHROSCOPY Left 11/18/2014   Procedure: ARTHROSCOPY KNEE;  Surgeon: Kennedy Bucker, MD;  Location: [facility] ORS;  Service: Orthopedics;  Laterality: Left;   partial medial menisectomy   ROTATOR CUFF REPAIR     THUMB FUSION Left 06/13/2012   TUBAL LIGATION     WISDOM TOOTH EXTRACTION      Medical History: Past Medical History:  Diagnosis Date   Anemia    Arthritis    Cancer (HCC)    Chronic kidney disease    Heart murmur     Family History: Family History  Problem Relation Age of Onset   Heart disease Mother    Hyperlipidemia Mother    Diabetes Father    Cancer Father    Arthritis Sister    Anxiety disorder Sister  Cancer Brother    Arthritis Sister    Arthritis Sister    Breast cancer Neg Hx       Review of Systems  Constitutional:  Negative for chills, fatigue and unexpected weight change.  HENT:  Positive for postnasal drip. Negative for congestion, rhinorrhea, sneezing and sore throat.   Eyes:  Negative for redness.  Respiratory:  Negative for cough, chest tightness and shortness of breath.   Cardiovascular:  Negative for chest pain and palpitations.  Gastrointestinal:  Negative for abdominal pain, constipation, diarrhea, nausea and vomiting.  Genitourinary:  Negative for dysuria and frequency.  Musculoskeletal:  Positive for arthralgias and myalgias. Negative for back pain,  joint swelling and neck pain.       Right hip/leg pain  Skin:  Negative for rash.  Neurological: Negative.  Negative for tremors and numbness.  Hematological:  Negative for adenopathy. Does not bruise/bleed easily.  Psychiatric/Behavioral:  Negative for behavioral problems (Depression), sleep disturbance and suicidal ideas. The patient is not nervous/anxious.      Vital Signs: BP 128/79   Pulse 64   Temp 98 F (36.7 C)   Resp 16   Ht 5\' 8"  (1.727 m)   Wt 150 lb (68 kg)   LMP 02/23/2021 (Exact Date)   SpO2 98%   BMI 22.81 kg/m    Physical Exam Vitals and nursing note reviewed.  Constitutional:      General: She is not in acute distress.    Appearance: Normal appearance. She is well-developed and normal weight. She is not diaphoretic.  HENT:     Head: Normocephalic and atraumatic.     Mouth/Throat:     Pharynx: No oropharyngeal exudate.  Eyes:     Pupils: Pupils are equal, round, and reactive to light.  Neck:     Thyroid: No thyromegaly.     Vascular: No JVD.     Trachea: No tracheal deviation.  Cardiovascular:     Rate and Rhythm: Normal rate and regular rhythm.     Heart sounds: Normal heart sounds. No murmur heard.    No friction rub. No gallop.  Pulmonary:     Effort: Pulmonary effort is normal. No respiratory distress.     Breath sounds: No wheezing or rales.  Chest:     Chest wall: No tenderness.  Abdominal:     General: Bowel sounds are normal.     Palpations: Abdomen is soft.     Tenderness: There is no abdominal tenderness.  Musculoskeletal:        General: Tenderness present. Normal range of motion.     Cervical back: Normal range of motion and neck supple.     Comments: Mildly tender along outside of right thigh, about to raise leg and ROM normal at hip  Lymphadenopathy:     Cervical: No cervical adenopathy.  Skin:    General: Skin is warm and dry.  Neurological:     Mental Status: She is alert and oriented to person, place, and time.     Cranial  Nerves: No cranial nerve deficit.  Psychiatric:        Behavior: Behavior normal.        Thought Content: Thought content normal.        Judgment: Judgment normal.      LABS: No results found for this or any previous visit (from the past 2160 hours).      Assessment/Plan: 1. Encounter for general adult medical examination with abnormal findings (Primary) CPE performed, lab slip  given, declines vaccines  2. Acute leg pain, right Just started yesterday and already improved some, may be IT band and will try stretching, advised to call office if not improving  3. Irritable bowel syndrome with constipation May continue linzess as before  4. Left atrial dilation Will update echo for monitoring - ECHOCARDIOGRAM COMPLETE; Future  5. Dilated aortic root (HCC) Will update echo for monitoring - ECHOCARDIOGRAM COMPLETE; Future  6. Mixed hyperlipidemia Continue crestor and will update labs  7. Visit for screening mammogram - MM 3D SCREENING MAMMOGRAM BILATERAL BREAST; Future  8. B12 deficiency Will check labs   General Counseling: sidnie swalley understanding of the findings of todays visit and agrees with plan of treatment. I have discussed any further diagnostic evaluation that may be needed or ordered today. We also reviewed her medications today. she has been encouraged to call the office with any questions or concerns that should arise related to todays visit.    Counseling:    Orders Placed This Encounter  Procedures   MM 3D SCREENING MAMMOGRAM BILATERAL BREAST   ECHOCARDIOGRAM COMPLETE    No orders of the defined types were placed in this encounter.   This patient was seen by Lynn Ito, PA-C in collaboration with Dr. Beverely Risen as a part of collaborative care agreement.  Total time spent:35 Minutes  Time spent includes review of chart, medications, test results, and follow up plan with the patient.     Lyndon Code, MD  Internal Medicine

## 2023-10-02 ENCOUNTER — Ambulatory Visit (HOSPITAL_COMMUNITY): Attending: Physician Assistant

## 2023-10-02 DIAGNOSIS — I77810 Thoracic aortic ectasia: Secondary | ICD-10-CM | POA: Diagnosis present

## 2023-10-02 DIAGNOSIS — I517 Cardiomegaly: Secondary | ICD-10-CM | POA: Diagnosis not present

## 2023-10-02 LAB — ECHOCARDIOGRAM COMPLETE
Area-P 1/2: 3.5 cm2
S' Lateral: 3.5 cm

## 2023-10-11 ENCOUNTER — Ambulatory Visit (INDEPENDENT_AMBULATORY_CARE_PROVIDER_SITE_OTHER): Admitting: Obstetrics and Gynecology

## 2023-10-11 ENCOUNTER — Telehealth: Payer: Self-pay

## 2023-10-11 ENCOUNTER — Encounter: Payer: Self-pay | Admitting: Obstetrics and Gynecology

## 2023-10-11 ENCOUNTER — Other Ambulatory Visit: Payer: Self-pay | Admitting: Physician Assistant

## 2023-10-11 VITALS — BP 129/85 | HR 76 | Ht 68.0 in | Wt 151.6 lb

## 2023-10-11 DIAGNOSIS — Z1211 Encounter for screening for malignant neoplasm of colon: Secondary | ICD-10-CM

## 2023-10-11 DIAGNOSIS — Z01419 Encounter for gynecological examination (general) (routine) without abnormal findings: Secondary | ICD-10-CM

## 2023-10-11 DIAGNOSIS — N393 Stress incontinence (female) (male): Secondary | ICD-10-CM

## 2023-10-11 DIAGNOSIS — Z79890 Hormone replacement therapy (postmenopausal): Secondary | ICD-10-CM

## 2023-10-11 MED ORDER — ESTRADIOL 1 MG PO TABS: 1.0000 mg | ORAL_TABLET | Freq: Every day | ORAL | 3 refills | Status: AC

## 2023-10-11 NOTE — Progress Notes (Signed)
 HPI:      [name] is a 53 y.o. [id number] who LMP was Patient's last menstrual period was 02/23/2021 (exact date).  Subjective:   She presents today for her annual examination.  She is taking estrogen daily.  She is happy on the estrogen.  She has an IUD for endometrial protection.  She has a history of uterine fibroids.    Hx: The following portions of the patient's history were reviewed and updated as appropriate:             She  has a past medical history of Anemia, Arthritis, Cancer (HCC), Chronic kidney disease, and Heart murmur. She does not have any pertinent problems on file. She  has a past surgical history that includes Cesarean section; Tubal ligation; Colonoscopy; Wisdom tooth extraction; Thumb fusion (Left, 06/13/2012); Knee arthroscopy (Left, 11/18/2014); Colonoscopy with propofol  (N/A, 01/04/2019); and Rotator cuff repair. Her family history includes Anxiety disorder in her sister; Arthritis in her sister, sister, and sister; Cancer in her brother and father; Diabetes in her father; Heart disease in her mother; Hyperlipidemia in her mother. She  reports that she has never smoked. She has never used smokeless tobacco. She reports that she does not drink alcohol and does not use drugs. She has a current medication list which includes the following prescription(s): cyanocobalamin , levonorgestrel , linzess , multivitamin, omeprazole , rosuvastatin , bd integra syringe, and estradiol . She is allergic to oxycodone , tape, and wound dressing adhesive.       Review of Systems:  Review of Systems  Constitutional: Denied constitutional symptoms, night sweats, recent illness, fatigue, fever, insomnia and weight loss.  Eyes: Denied eye symptoms, eye pain, photophobia, vision change and visual disturbance.  Ears/Nose/Throat/Neck: Denied ear, nose, throat or neck symptoms, hearing loss, nasal discharge, sinus congestion and sore throat.  Cardiovascular: Denied cardiovascular symptoms,  arrhythmia, chest pain/pressure, edema, exercise intolerance, orthopnea and palpitations.  Respiratory: Denied pulmonary symptoms, asthma, pleuritic pain, productive sputum, cough, dyspnea and wheezing.  Gastrointestinal: Denied, gastro-esophageal reflux, melena, nausea and vomiting.  Genitourinary: Denied genitourinary symptoms including symptomatic vaginal discharge, pelvic relaxation issues, and urinary complaints.  Musculoskeletal: Denied musculoskeletal symptoms, stiffness, swelling, muscle weakness and myalgia.  Dermatologic: Denied dermatology symptoms, rash and scar.  Neurologic: Denied neurology symptoms, dizziness, headache, neck pain and syncope.  Psychiatric: Denied psychiatric symptoms, anxiety and depression.  Endocrine: Denied endocrine symptoms including hot flashes and night sweats.   Meds:   Current Outpatient Medications on File Prior to Visit  Medication Sig Dispense Refill   cyanocobalamin  (VITAMIN B12) 1000 MCG/ML injection INJECT 1 ML INTRAMUSCULAR ONCE A WEEK FOR 3 WEEKS. THEN INJECT 1 ML ONCE EVERY 30 DAYS E55.9 4 mL 3   levonorgestrel  (MIRENA ) 20 MCG/DAY IUD 1 each by Intrauterine route once.     LINZESS  145 MCG CAPS capsule TAKE 1 CAPSULE BY MOUTH EVERY DAY BEFORE BREAKFAST 90 capsule 1   Multiple Vitamin (MULTIVITAMIN) capsule Take 1 capsule by mouth daily.     omeprazole  (PRILOSEC) 40 MG capsule Take 1 capsule (40 mg total) by mouth daily. 30 capsule 3   rosuvastatin  (CRESTOR ) 5 MG tablet Take 1 tablet (5 mg total) by mouth daily. 90 tablet 1   SYRINGE-NEEDLE, DISP, 3 ML (BD INTEGRA SYRINGE) 25G X 1" 3 ML MISC USE AS DIRECTED TO INJECT 1 ML OF B12 INTRAMUSCULAR ONCE A WEEK FOR 3 WEEKS AND THEN ONCE EVERY 30 DAYS E55.9 4 each 5   No current facility-administered medications on file prior to visit.  Objective:     Vitals:   10/11/23 0828  BP: 129/85  Pulse: 76    Filed Weights   10/11/23 0828  Weight: 151 lb 9.6 oz (68.8 kg)              She has  declined examination today.  Assessment:    [id number] Patient Active Problem List   Diagnosis Date Noted   Varicose veins of both lower extremities with inflammation 12/11/2019   Pain in both lower extremities 11/05/2019   Baker's cyst of knee, unspecified laterality 11/05/2019   Irritable bowel syndrome with constipation 11/05/2019   Encounter for general adult medical examination with abnormal findings 08/19/2019   Chronic right shoulder pain 08/19/2019   Acute otitis media 11/19/2018   Screening for colon cancer 08/14/2018   Dysuria 08/14/2018   Vitamin D  deficiency 08/14/2018   Uterine leiomyoma 07/04/2018   Urinary tract infection with hematuria 04/05/2018   Acute non-recurrent sinusitis 04/05/2018   Acute low back pain 04/05/2018   Chest pain 01/17/2018   Generalized anxiety disorder 01/17/2018   Acute otitis externa of both ears 01/17/2018   Subacromial bursitis of right shoulder joint 12/15/2017   Pain in right hand 09/18/2017   Primary osteoarthritis of hands, bilateral 08/27/2017   Melanoma (HCC) 01/10/2017   Morton's neuroma 01/17/2014   Multiple renal cysts 01/17/2014   Psoriatic arthritis (HCC) 01/17/2014     1. Well woman exam with routine gynecological exam   2. Screening for colon cancer   3. Postmenopausal hormone therapy     Doing well on ERT.  IUD for endometrial protection.  History of uterine fibroids not causing any issues.   Plan:            1.  Basic Screening Recommendations The basic screening recommendations for asymptomatic women were discussed with the patient during her visit.  The age-appropriate recommendations were discussed with her and the rational for the tests reviewed.  When I am informed by the patient that another primary care physician has previously obtained the age-appropriate tests and they are up-to-date, only outstanding tests are ordered and referrals given as necessary.  Abnormal results of tests will be discussed with her  when all of her results are completed.  Routine preventative health maintenance measures emphasized: Exercise/Diet/Weight control, Tobacco Warnings, Alcohol/Substance use risks and Stress Management 2.  Continue ERT -we have discussed the necessity of replacing the IUD at 8 years or removing the IUD and beginning full HRT including progesterone.  As this is several years away I just wanted to at least get her thinking and understanding the process for the future.  Orders Orders Placed This Encounter  Procedures   Ambulatory referral to Gastroenterology     Meds ordered this encounter  Medications   estradiol  (ESTRACE ) 1 MG tablet    Sig: Take 1 tablet (1 mg total) by mouth daily.    Dispense:  90 tablet    Refill:  3          F/U  Return in about 1 year (around 10/10/2024) for Annual Physical.  Delice Felt, M.D. 10/11/2023 8:43 AM

## 2023-10-11 NOTE — Progress Notes (Signed)
 Patients presents for annual exam today. She states doing well with current HRT. Reports her incontinence issues have resolved. Up to date on pap smear. Due for mammogram, ordered by PCP. Patients annual labs completed. Colonoscopy referral placed, due in July. She states no other questions or concerns at this time.

## 2023-10-11 NOTE — Telephone Encounter (Signed)
 Contacted patient to schedule her colonoscopy.  Pt was advised that Dr. Antony Baumgartner will be leaving our office to join another local clinic.  She stated that Dr. Antony Baumgartner is such a wonderful doctor and she really likes him.  While she is sure Dr. Ole Berkeley is quite capable of doing her colonoscopy-she would like to continue to have Dr. Antony Baumgartner as her GI Physician and has requested to have her referral sent to the local clinic.  Informed patient that I would make Dr. Antony Baumgartner and her PCP aware of her request.  Her colonoscopy will be due at the end of July with Dr. Antony Baumgartner.  PCP has been included in this message to request referral to be sent to [facility] on patients behalf.  Thanks, [place], [place]

## 2023-10-12 ENCOUNTER — Other Ambulatory Visit: Payer: Self-pay | Admitting: Physician Assistant

## 2023-10-12 DIAGNOSIS — Z1211 Encounter for screening for malignant neoplasm of colon: Secondary | ICD-10-CM

## 2023-10-12 LAB — COMPREHENSIVE METABOLIC PANEL WITH GFR
ALT: 19 IU/L (ref 0–32)
AST: 18 IU/L (ref 0–40)
Albumin: 4.7 g/dL (ref 3.8–4.9)
Alkaline Phosphatase: 70 IU/L (ref 44–121)
BUN/Creatinine Ratio: 17 (ref 9–23)
BUN: 12 mg/dL (ref 6–24)
Bilirubin Total: 0.4 mg/dL (ref 0.0–1.2)
CO2: 23 mmol/L (ref 20–29)
Calcium: 9.5 mg/dL (ref 8.7–10.2)
Chloride: 103 mmol/L (ref 96–106)
Creatinine, Ser: 0.71 mg/dL (ref 0.57–1.00)
Globulin, Total: 2.2 g/dL (ref 1.5–4.5)
Glucose: 109 mg/dL — ABNORMAL HIGH (ref 70–99)
Potassium: 4.4 mmol/L (ref 3.5–5.2)
Sodium: 139 mmol/L (ref 134–144)
Total Protein: 6.9 g/dL (ref 6.0–8.5)
eGFR: 101 mL/min/{1.73_m2} (ref >59)

## 2023-10-12 LAB — LIPID PANEL WITH LDL/HDL RATIO
Cholesterol, Total: 151 mg/dL (ref 100–199)
HDL: 62 mg/dL (ref >39)
LDL Chol Calc (NIH): 76 mg/dL (ref 0–99)
LDL/HDL Ratio: 1.2 ratio (ref 0.0–3.2)
Triglycerides: 67 mg/dL (ref 0–149)
VLDL Cholesterol Cal: 13 mg/dL (ref 5–40)

## 2023-10-12 LAB — VITAMIN D 25 HYDROXY (VIT D DEFICIENCY, FRACTURES): Vit D, 25-Hydroxy: 31.9 ng/mL (ref 30.0–100.0)

## 2023-10-12 LAB — CBC WITH DIFFERENTIAL/PLATELET
Basophils Absolute: 0 10*3/uL (ref 0.0–0.2)
Basos: 0 %
EOS (ABSOLUTE): 0 10*3/uL (ref 0.0–0.4)
Eos: 1 %
Hematocrit: 38.8 % (ref 34.0–46.6)
Hemoglobin: 13.2 g/dL (ref 11.1–15.9)
Immature Grans (Abs): 0 10*3/uL (ref 0.0–0.1)
Immature Granulocytes: 0 %
Lymphocytes Absolute: 1.2 10*3/uL (ref 0.7–3.1)
Lymphs: 36 %
MCH: 31.3 pg (ref 26.6–33.0)
MCHC: 34 g/dL (ref 31.5–35.7)
MCV: 92 fL (ref 79–97)
Monocytes Absolute: 0.3 10*3/uL (ref 0.1–0.9)
Monocytes: 7 %
Neutrophils Absolute: 1.9 10*3/uL (ref 1.4–7.0)
Neutrophils: 56 %
Platelets: 225 10*3/uL (ref 150–450)
RBC: 4.22 x10E6/uL (ref 3.77–5.28)
RDW: 12.1 % (ref 11.7–15.4)
WBC: 3.4 10*3/uL (ref 3.4–10.8)

## 2023-10-12 LAB — B12 AND FOLATE PANEL
Folate: 14.7 ng/mL (ref >3.0)
Vitamin B-12: 419 pg/mL (ref 232–1245)

## 2023-10-12 LAB — FERRITIN: Ferritin: 78 ng/mL (ref 15–150)

## 2023-10-12 LAB — IRON AND TIBC
Iron Saturation: 23 % (ref 15–55)
Iron: 66 ug/dL (ref 27–159)
Total Iron Binding Capacity: 293 ug/dL (ref 250–450)
UIBC: 227 ug/dL (ref 131–425)

## 2023-10-12 LAB — TSH: TSH: 4.26 u[IU]/mL (ref 0.450–4.500)

## 2023-10-12 LAB — T4, FREE: Free T4: 1.06 ng/dL (ref 0.82–1.77)

## 2023-10-13 ENCOUNTER — Telehealth: Payer: Self-pay | Admitting: Physician Assistant

## 2023-10-13 NOTE — Telephone Encounter (Signed)
 GI referral was faxed to [facility] by her GYN provider-Toni

## 2023-10-19 ENCOUNTER — Encounter: Payer: Self-pay | Admitting: Physician Assistant

## 2023-10-19 ENCOUNTER — Ambulatory Visit: Admitting: Physician Assistant

## 2023-10-19 VITALS — BP 120/70 | HR 65 | Temp 97.8°F | Resp 16 | Ht 68.0 in | Wt 154.2 lb

## 2023-10-19 DIAGNOSIS — R002 Palpitations: Secondary | ICD-10-CM

## 2023-10-19 DIAGNOSIS — I517 Cardiomegaly: Secondary | ICD-10-CM

## 2023-10-19 DIAGNOSIS — R7301 Impaired fasting glucose: Secondary | ICD-10-CM | POA: Diagnosis not present

## 2023-10-19 DIAGNOSIS — I77810 Thoracic aortic ectasia: Secondary | ICD-10-CM | POA: Diagnosis not present

## 2023-10-19 DIAGNOSIS — K581 Irritable bowel syndrome with constipation: Secondary | ICD-10-CM

## 2023-10-19 LAB — POCT GLYCOSYLATED HEMOGLOBIN (HGB A1C): Hemoglobin A1C: 5.4 % (ref 4.0–5.6)

## 2023-10-19 MED ORDER — LINACLOTIDE 145 MCG PO CAPS: 145.0000 ug | ORAL_CAPSULE | Freq: Every day | ORAL | 1 refills | Status: DC

## 2023-10-19 NOTE — Progress Notes (Signed)
 [facility] [address] [place], [place] [address]  Internal MEDICINE  Office Visit Note  Patient Name: Judith Gomez  [id number]  [id number]  Date of Service: 10/19/2023  Chief Complaint  Patient presents with   Follow-up    Echo   Anemia    HPI Pt is here for routine follow up -labs reviewed--glucose elevated--will check A1c, B12 low end of normal and will continue shots, Vit D borderline low -echo reviewed--no aortic root dilatation on repeat echo, EF normal, trivial MR -occasional palpitations, but not currently and only happends 1-2 times per month will track and will consider holter monitor -Will be having colonoscopy once Dr. Antony Baumgartner moves over to [facility]  Current Medication: Outpatient Encounter Medications as of 10/19/2023  Medication Sig   cyanocobalamin  (VITAMIN B12) 1000 MCG/ML injection INJECT 1 ML INTRAMUSCULAR ONCE A WEEK FOR 3 WEEKS. THEN INJECT 1 ML ONCE EVERY 30 DAYS E55.9   estradiol  (ESTRACE ) 1 MG tablet Take 1 tablet (1 mg total) by mouth daily.   levonorgestrel  (MIRENA ) 20 MCG/DAY IUD 1 each by Intrauterine route once.   Multiple Vitamin (MULTIVITAMIN) capsule Take 1 capsule by mouth daily.   omeprazole  (PRILOSEC) 40 MG capsule Take 1 capsule (40 mg total) by mouth daily.   rosuvastatin  (CRESTOR ) 5 MG tablet Take 1 tablet (5 mg total) by mouth daily.   SYRINGE-NEEDLE, DISP, 3 ML (BD INTEGRA SYRINGE) 25G X 1" 3 ML MISC USE AS DIRECTED TO INJECT 1 ML OF B12 INTRAMUSCULAR ONCE A WEEK FOR 3 WEEKS AND THEN ONCE EVERY 30 DAYS E55.9   [DISCONTINUED] LINZESS  145 MCG CAPS capsule TAKE 1 CAPSULE BY MOUTH EVERY DAY BEFORE BREAKFAST   linaclotide  (LINZESS ) 145 MCG CAPS capsule Take 1 capsule (145 mcg total) by mouth daily before breakfast.   No facility-administered encounter medications on file as of 10/19/2023.    Surgical History: Past Surgical History:  Procedure Laterality Date   CESAREAN SECTION     COLONOSCOPY     COLONOSCOPY WITH PROPOFOL  N/A 01/04/2019    Procedure: COLONOSCOPY WITH PROPOFOL ;  Surgeon: Luke Salaam, MD;  Location: [facility] ENDOSCOPY;  Service: Gastroenterology;  Laterality: N/A;   KNEE ARTHROSCOPY Left 11/18/2014   Procedure: ARTHROSCOPY KNEE;  Surgeon: Molli Angelucci, MD;  Location: [facility] ORS;  Service: Orthopedics;  Laterality: Left;   partial medial menisectomy   ROTATOR CUFF REPAIR     THUMB FUSION Left 06/13/2012   TUBAL LIGATION     WISDOM TOOTH EXTRACTION      Medical History: Past Medical History:  Diagnosis Date   Anemia    Arthritis    Cancer (HCC)    Chronic kidney disease    Heart murmur     Family History: Family History  Problem Relation Age of Onset   Heart disease Mother    Hyperlipidemia Mother    Diabetes Father    Cancer Father    Arthritis Sister    Anxiety disorder Sister    Cancer Brother    Arthritis Sister    Arthritis Sister    Breast cancer Neg Hx     Social History   Socioeconomic History   Marital status: Married    Spouse name: Not on file   Number of children: Not on file   Years of education: Not on file   Highest education level: Not on file  Occupational History   Not on file  Tobacco Use   Smoking status: Never   Smokeless tobacco: Never  Vaping Use  Vaping status: Never Used  Substance and Sexual Activity   Alcohol use: No   Drug use: No   Sexual activity: Yes    Birth control/protection: I.U.D., Post-menopausal  Other Topics Concern   Not on file  Social History Narrative   Not on file   Social Drivers of Health   Financial Resource Strain: Not on file  Food Insecurity: Not on file  Transportation Needs: Not on file  Physical Activity: Not on file  Stress: Not on file  Social Connections: Unknown (10/26/2021)   Received from [facility]   Social Network    Social Network: Not on file  Intimate Partner Violence: Unknown (09/17/2021)   Received from [facility], [facility]   HITS    Physically Hurt: Not on file    Insult or Talk  Down To: Not on file    Threaten Physical Harm: Not on file    Scream or Curse: Not on file      Review of Systems  Constitutional:  Negative for chills, fatigue and unexpected weight change.  HENT:  Positive for postnasal drip. Negative for congestion, rhinorrhea, sneezing and sore throat.   Eyes:  Negative for redness.  Respiratory:  Negative for cough, chest tightness and shortness of breath.   Cardiovascular:  Positive for palpitations. Negative for chest pain.  Gastrointestinal:  Negative for abdominal pain, constipation, diarrhea, nausea and vomiting.  Genitourinary:  Negative for dysuria and frequency.  Musculoskeletal:  Negative for back pain, joint swelling and neck pain.  Skin:  Negative for rash.  Neurological: Negative.  Negative for tremors and numbness.  Hematological:  Negative for adenopathy. Does not bruise/bleed easily.  Psychiatric/Behavioral:  Negative for behavioral problems (Depression), sleep disturbance and suicidal ideas. The patient is not nervous/anxious.     Vital Signs: BP 120/70   Pulse 65   Temp 97.8 F (36.6 C)   Resp 16   Ht 5\' 8"  (1.727 m)   Wt 154 lb 3.2 oz (69.9 kg)   LMP 02/23/2021 (Exact Date)   SpO2 97%   BMI 23.45 kg/m    Physical Exam Vitals and nursing note reviewed.  Constitutional:      General: She is not in acute distress.    Appearance: She is well-developed. She is not diaphoretic.  HENT:     Head: Normocephalic and atraumatic.  Neck:     Thyroid: No thyromegaly.     Vascular: No JVD.     Trachea: No tracheal deviation.  Cardiovascular:     Rate and Rhythm: Normal rate and regular rhythm.     Heart sounds: Normal heart sounds. No murmur heard.    No friction rub. No gallop.  Pulmonary:     Effort: Pulmonary effort is normal. No respiratory distress.     Breath sounds: No wheezing or rales.  Musculoskeletal:        General: Normal range of motion.  Skin:    General: Skin is warm and dry.  Neurological:      Mental Status: She is alert and oriented to person, place, and time.  Psychiatric:        Behavior: Behavior normal.        Thought Content: Thought content normal.        Judgment: Judgment normal.        Assessment/Plan: 1. Impaired fasting blood sugar (Primary) - POCT HgB A1C is 5.4 which is normal, continue to monitor  2. Left atrial dilation Normal on recent echo  3. Dilated aortic root (HCC) Normal on recent echo  4. Irritable bowel syndrome with constipation May continue linzess  - linaclotide  (LINZESS ) 145 MCG CAPS capsule; Take 1 capsule (145 mcg total) by mouth daily before breakfast.  Dispense: 90 capsule; Refill: 1  5. Intermittent palpitations Infrequent and thinks related to caffeine/stress. No lightheadedness or fatigue. Will monitor and advised to call if any symptoms with these or if happening more as a long term heart monitor can be ordered   General Counseling: capresha beckey understanding of the findings of todays visit and agrees with plan of treatment. I have discussed any further diagnostic evaluation that may be needed or ordered today. We also reviewed her medications today. she has been encouraged to call the office with any questions or concerns that should arise related to todays visit.    Orders Placed This Encounter  Procedures   POCT HgB A1C    Meds ordered this encounter  Medications   linaclotide  (LINZESS ) 145 MCG CAPS capsule    Sig: Take 1 capsule (145 mcg total) by mouth daily before breakfast.    Dispense:  90 capsule    Refill:  1    This patient was seen by Taylor Favia, PA-C in collaboration with Dr. Verneta Gone as a part of collaborative care agreement.   Total time spent:30 Minutes Time spent includes review of chart, medications, test results, and follow up plan with the patient.      Dr Fozia M Khan Internal medicine

## 2023-10-23 ENCOUNTER — Other Ambulatory Visit: Payer: Self-pay

## 2023-10-23 ENCOUNTER — Encounter: Payer: Self-pay | Admitting: Emergency Medicine

## 2023-10-23 ENCOUNTER — Ambulatory Visit
Admission: EM | Admit: 2023-10-23 | Discharge: 2023-10-23 | Disposition: A | Discharge status: Home / Self Care | Attending: Emergency Medicine | Admitting: Emergency Medicine

## 2023-10-23 DIAGNOSIS — J029 Acute pharyngitis, unspecified: Secondary | ICD-10-CM | POA: Diagnosis not present

## 2023-10-23 DIAGNOSIS — B349 Viral infection, unspecified: Secondary | ICD-10-CM | POA: Insufficient documentation

## 2023-10-23 LAB — POCT RAPID STREP A (OFFICE): Rapid Strep A Screen: NEGATIVE

## 2023-10-23 NOTE — ED Provider Notes (Signed)
 [facility]    CSN: [id number] Arrival date & time: 10/23/23  1644      History   Chief Complaint Chief Complaint  Patient presents with   Sore Throat   Headache    HPI DOMIQUE JURKOWSKI is a 53 y.o. female.   Patient presents to clinic over concerns of nasal congestion, rhinorrhea, postnasal drip, sinus pressure, sore throat, bilateral ear pain and headache that started 3 days ago.  She recently got PRP in her right elbow so she is unable to take ibuprofen for the next 2-1/2 weeks, has been taking Tylenol  with minimal relief.  Her son is sick with similar symptoms, has been sick for the last month or so.  She does not think she has had any fevers, did get hot 1 night and had to remove the covers.  Reports her throat feels like a brillo pad.  Had tea with honey yesterday.  Has not had any wheezing or shortness of breath.  The history is provided by the patient and medical records.  Sore Throat  Headache   Past Medical History:  Diagnosis Date   Anemia    Arthritis    Cancer (HCC)    Chronic kidney disease    Heart murmur     Patient Active Problem List   Diagnosis Date Noted   Varicose veins of both lower extremities with inflammation 12/11/2019   Pain in both lower extremities 11/05/2019   Baker's cyst of knee, unspecified laterality 11/05/2019   Irritable bowel syndrome with constipation 11/05/2019   Encounter for general adult medical examination with abnormal findings 08/19/2019   Chronic right shoulder pain 08/19/2019   Acute otitis media 11/19/2018   Screening for colon cancer 08/14/2018   Dysuria 08/14/2018   Vitamin D  deficiency 08/14/2018   Uterine leiomyoma 07/04/2018   Urinary tract infection with hematuria 04/05/2018   Acute non-recurrent sinusitis 04/05/2018   Acute low back pain 04/05/2018   Chest pain 01/17/2018   Generalized anxiety disorder 01/17/2018   Acute otitis externa of both ears 01/17/2018   Subacromial bursitis of right  shoulder joint 12/15/2017   Pain in right hand 09/18/2017   Primary osteoarthritis of hands, bilateral 08/27/2017   Melanoma (HCC) 01/10/2017   Morton's neuroma 01/17/2014   Multiple renal cysts 01/17/2014   Psoriatic arthritis (HCC) 01/17/2014    Past Surgical History:  Procedure Laterality Date   CESAREAN SECTION     COLONOSCOPY     COLONOSCOPY WITH PROPOFOL  N/A 01/04/2019   Procedure: COLONOSCOPY WITH PROPOFOL ;  Surgeon: Luke Salaam, MD;  Location: [facility] ENDOSCOPY;  Service: Gastroenterology;  Laterality: N/A;   KNEE ARTHROSCOPY Left 11/18/2014   Procedure: ARTHROSCOPY KNEE;  Surgeon: Molli Angelucci, MD;  Location: [facility] ORS;  Service: Orthopedics;  Laterality: Left;   partial medial menisectomy   ROTATOR CUFF REPAIR     THUMB FUSION Left 06/13/2012   TUBAL LIGATION     WISDOM TOOTH EXTRACTION      OB History     Gravida  4   Para  4   Term  4   Preterm      AB      Living  4      SAB      IAB      Ectopic      Multiple      Live Births  4            Home Medications    Prior to Admission medications  Medication Sig Start Date End Date Taking? Authorizing Provider  cyanocobalamin  (VITAMIN B12) 1000 MCG/ML injection INJECT 1 ML INTRAMUSCULAR ONCE A WEEK FOR 3 WEEKS. THEN INJECT 1 ML ONCE EVERY 30 DAYS E55.9 10/17/22   McDonough, Lauren K, PA-C  estradiol  (ESTRACE ) 1 MG tablet Take 1 tablet (1 mg total) by mouth daily. 10/11/23   Zenobia Hila, MD  levonorgestrel  (MIRENA ) 20 MCG/DAY IUD 1 each by Intrauterine route once.    [provider]  linaclotide  (LINZESS ) 145 MCG CAPS capsule Take 1 capsule (145 mcg total) by mouth daily before breakfast. 10/19/23   McDonough, Lauren K, PA-C  Multiple Vitamin (MULTIVITAMIN) capsule Take 1 capsule by mouth daily.    [provider]  omeprazole  (PRILOSEC) 40 MG capsule Take 1 capsule (40 mg total) by mouth daily. 12/29/22   McDonough, Lillie Reining, PA-C  rosuvastatin  (CRESTOR ) 5 MG tablet Take 1 tablet  (5 mg total) by mouth daily. 02/15/23   McDonough, Lauren K, PA-C  SYRINGE-NEEDLE, DISP, 3 ML (BD INTEGRA SYRINGE) 25G X 1" 3 ML MISC USE AS DIRECTED TO INJECT 1 ML OF B12 INTRAMUSCULAR ONCE A WEEK FOR 3 WEEKS AND THEN ONCE EVERY 30 DAYS E55.9 10/14/22   McDonough, Lillie Reining, PA-C    Family History Family History  Problem Relation Age of Onset   Heart disease Mother    Hyperlipidemia Mother    Diabetes Father    Cancer Father    Arthritis Sister    Anxiety disorder Sister    Cancer Brother    Arthritis Sister    Arthritis Sister    Breast cancer Neg Hx     Social History Social History   Tobacco Use   Smoking status: Never   Smokeless tobacco: Never  Vaping Use   Vaping status: Never Used  Substance Use Topics   Alcohol use: No   Drug use: No     Allergies   Oxycodone , Tape, and Wound dressing adhesive   Review of Systems Review of Systems  Per HPI  Physical Exam Triage Vital Signs ED Triage Vitals [10/23/23 1652]  Encounter Vitals Group     BP 130/85     Systolic BP Percentile      Diastolic BP Percentile      Pulse Rate 70     Resp 18     Temp 98.6 F (37 C)     Temp Source Oral     SpO2 98 %     Weight      Height      Head Circumference      Peak Flow      Pain Score 10     Pain Loc      Pain Education      Exclude from Growth Chart    No data found.  Updated Vital Signs BP 130/85 (BP Location: Right Arm)   Pulse 70   Temp 98.6 F (37 C) (Oral)   Resp 18   LMP 02/23/2021 (Exact Date)   SpO2 98%   Visual Acuity Right Eye Distance:   Left Eye Distance:   Bilateral Distance:    Right Eye Near:   Left Eye Near:    Bilateral Near:     Physical Exam Vitals and nursing note reviewed.  Constitutional:      Appearance: She is well-developed.  HENT:     Head: Normocephalic and atraumatic.     Right Ear: A middle ear effusion is present.     Left Ear:  A middle ear effusion is present.     Nose: Congestion and rhinorrhea present.      Mouth/Throat:     Mouth: Mucous membranes are moist.     Pharynx: Uvula midline. Posterior oropharyngeal erythema present.     Tonsils: No tonsillar exudate or tonsillar abscesses. 0 on the right. 0 on the left.  Cardiovascular:     Rate and Rhythm: Normal rate and regular rhythm.     Heart sounds: Normal heart sounds. No murmur heard. Pulmonary:     Effort: Pulmonary effort is normal. No respiratory distress.     Breath sounds: Normal breath sounds. No wheezing.  Skin:    General: Skin is warm and dry.  Neurological:     General: No focal deficit present.     Mental Status: She is alert.  Psychiatric:        Mood and Affect: Mood normal.      UC Treatments / Results  Labs (all labs ordered are listed, but only abnormal results are displayed) Labs Reviewed  POCT RAPID STREP A (OFFICE)    EKG   Radiology No results found.  Procedures Procedures (including critical care time)  Medications Ordered in UC Medications - No data to display  Initial Impression / Assessment and Plan / UC Course  I have reviewed the triage vital signs and the nursing notes.  Pertinent labs & imaging results that were available during my care of the patient were reviewed by me and considered in my medical decision making (see chart for details).  Vitals in triage reviewed, patient is hemodynamically stable.  Lungs are vesicular, heart with regular rate and rhythm.  Bilateral middle ear effusions.  Congestion, rhinorrhea and postnasal drip present.  Posterior pharynx with erythema, uvula midline, tonsils without exudate.  POC rapid strep negative, will send for culture.  Suspect viral illness causing symptoms.  Symptomatic management discussed.  Plan of care, follow-up care return precautions given, no questions at this time.     Final Clinical Impressions(s) / UC Diagnoses   Final diagnoses:  Viral illness  Acute pharyngitis, unspecified etiology     Discharge Instructions       Rapid strep testing was negative, we are sending this off for culture and we will contact you if antibiotics are indicated.  In the meantime you can do warm saline gargles, tea with honey and sleep with a humidifier.  Popsicles or ice cream can also help soothe the sore throat.  He can take 500 mg of Tylenol  every 8 hours as needed for fever, aches and pains.  1200 mg of Mucinex daily in addition to Flonase and nightly 64 ounces of water can help loosen secretions.  Consider over-the-counter saline nasal rinse as well with filtered water to manually declog the sinuses.  Return to clinic or follow-up with your primary care provider if your symptoms worsen or change, or do not improve over the next week.   ED Prescriptions   None    PDMP not reviewed this encounter.   Harlow Lighter, Corliss Coggeshall  N, FNP 10/23/23 1729

## 2023-10-23 NOTE — ED Triage Notes (Signed)
 Pt c/o nasal congestion, sinus pressure, sore throat and HA since Saturday.

## 2023-10-23 NOTE — Discharge Instructions (Addendum)
 Rapid strep testing was negative, we are sending this off for culture and we will contact you if antibiotics are indicated.  In the meantime you can do warm saline gargles, tea with honey and sleep with a humidifier.  Popsicles or ice cream can also help soothe the sore throat.  He can take 500 mg of Tylenol  every 8 hours as needed for fever, aches and pains.  1200 mg of Mucinex daily in addition to Flonase and nightly 64 ounces of water can help loosen secretions.  Consider over-the-counter saline nasal rinse as well with filtered water to manually declog the sinuses.  Return to clinic or follow-up with your primary care provider if your symptoms worsen or change, or do not improve over the next week.

## 2023-10-26 LAB — CULTURE, GROUP A STREP (THRC)

## 2023-10-27 ENCOUNTER — Ambulatory Visit (HOSPITAL_COMMUNITY): Payer: Self-pay

## 2023-11-14 ENCOUNTER — Other Ambulatory Visit: Payer: Self-pay | Admitting: Physician Assistant

## 2023-11-18 ENCOUNTER — Other Ambulatory Visit: Payer: Self-pay | Admitting: Physician Assistant

## 2023-11-18 DIAGNOSIS — K219 Gastro-esophageal reflux disease without esophagitis: Secondary | ICD-10-CM

## 2023-11-18 DIAGNOSIS — E782 Mixed hyperlipidemia: Secondary | ICD-10-CM

## 2023-11-22 ENCOUNTER — Telehealth: Payer: Self-pay | Admitting: Physician Assistant

## 2023-11-22 NOTE — Addendum Note (Signed)
 Addended by: Vale Garrison on: 11/22/2023 09:18 AM   Modules accepted: Orders

## 2023-11-22 NOTE — Telephone Encounter (Signed)
 Patient called stating [facility] wants her to come in for consultation prior to colonoscopy. She said she cannot take off work just for that. I explained to her we did not send the GI referral to [facility], since her GYN provider had already referred her and that she will need to call their office in regards to issues-Toni

## 2023-12-14 IMAGING — CR DG ANKLE COMPLETE 3+V*L*
1 series · 3 of 3 positions shown · non-contrast
Comparison: None.

CLINICAL DATA: fall

EXAM:
LEFT ANKLE COMPLETE - 3+ VIEW

[Series 1: dg ankle complete left · 0.14mm/px · 3 of 3 slices shown]
[im 1/3]
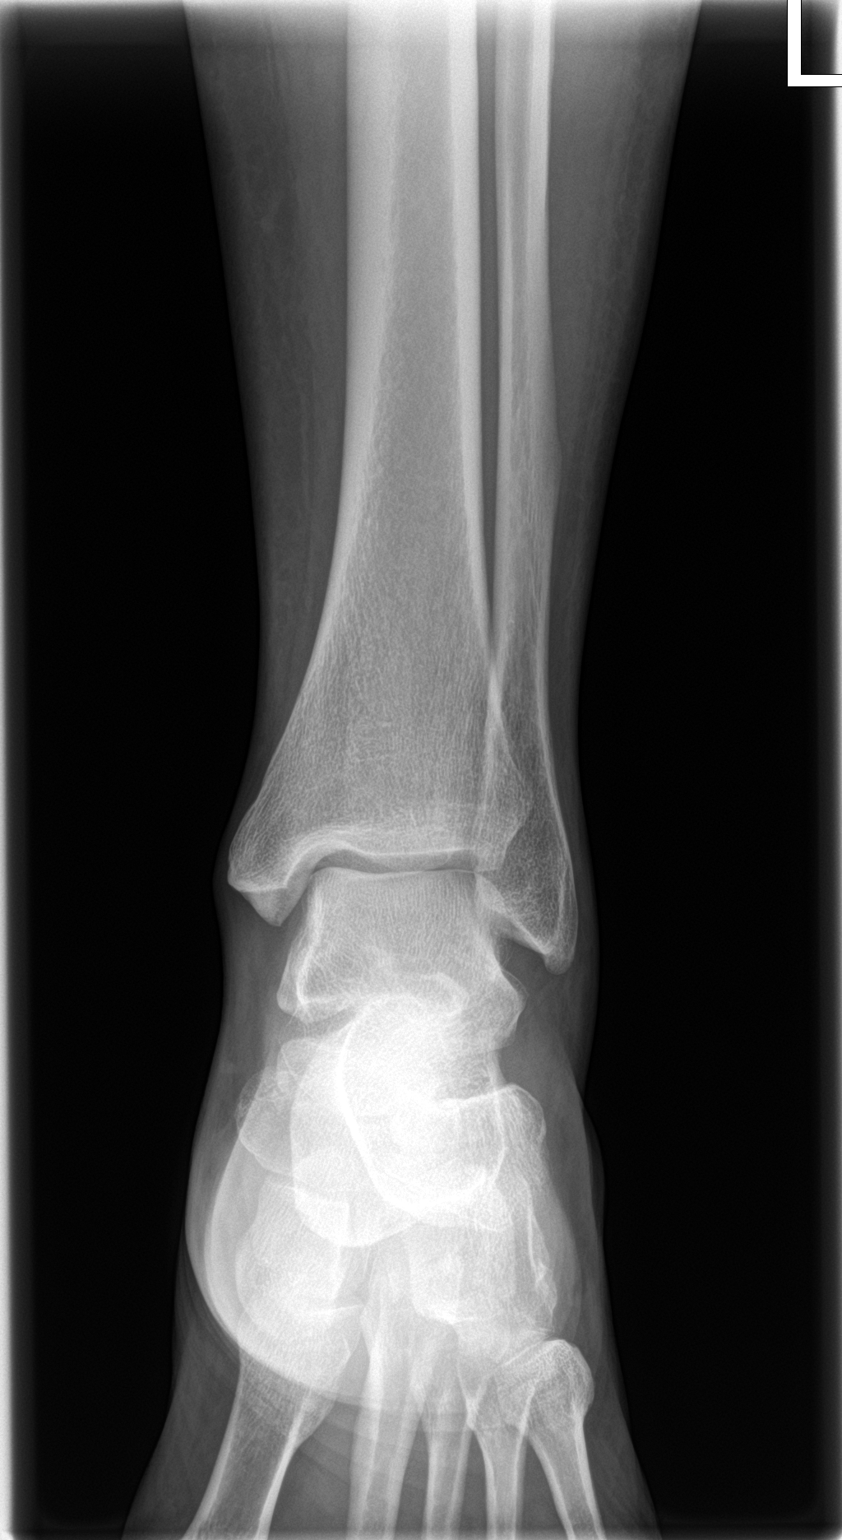
[im 2/3]
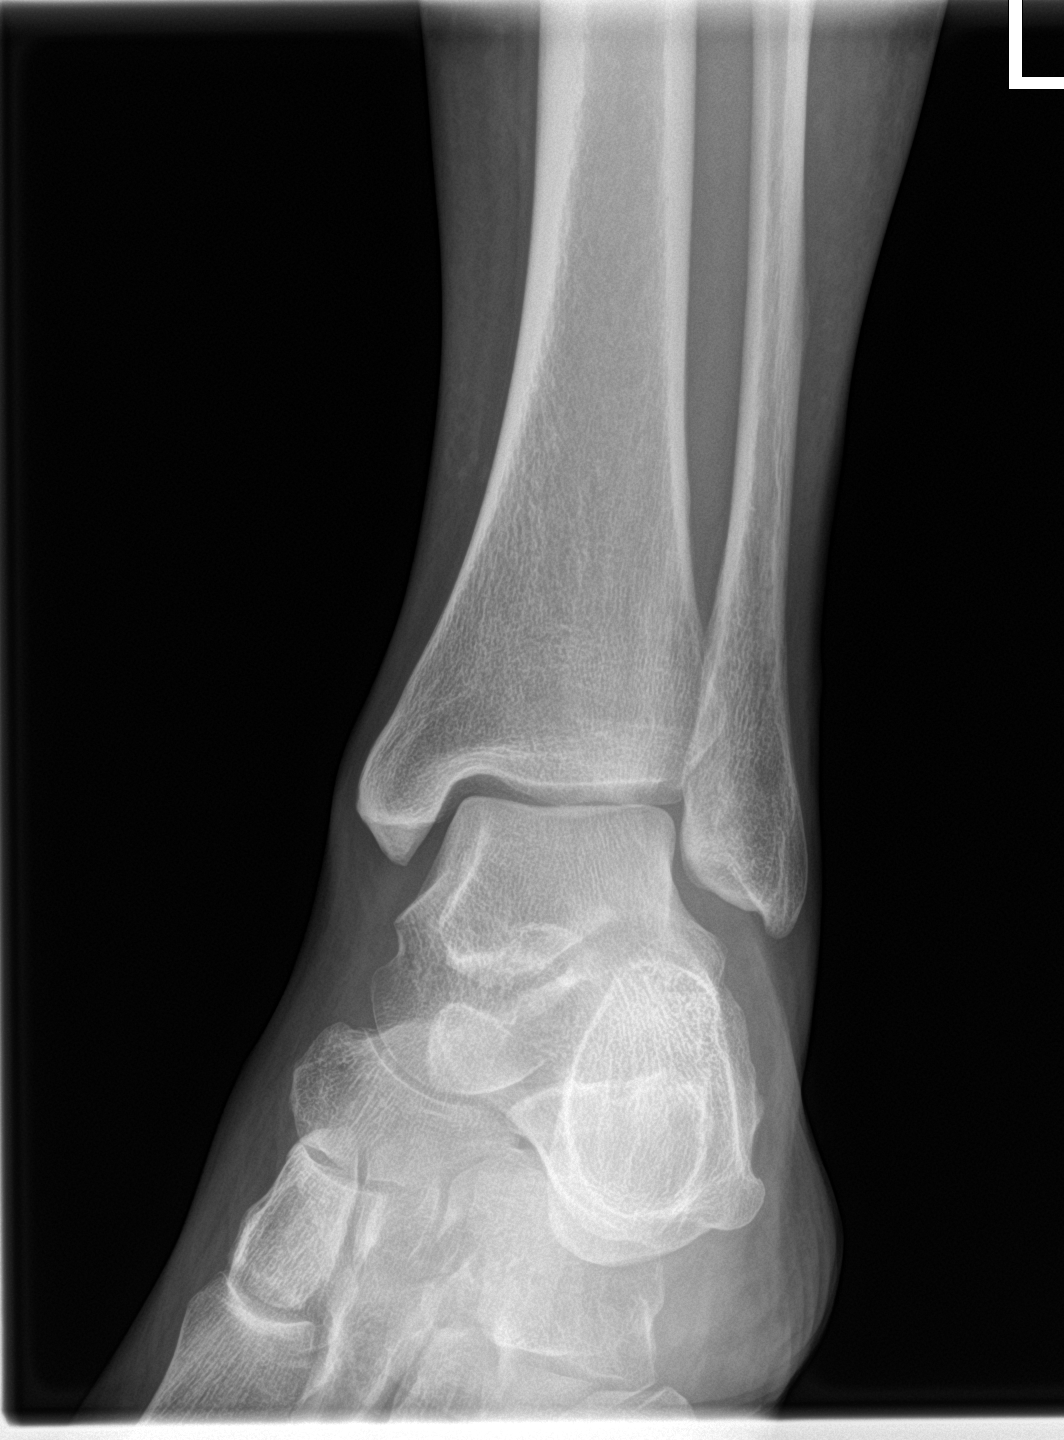
[im 3/3]
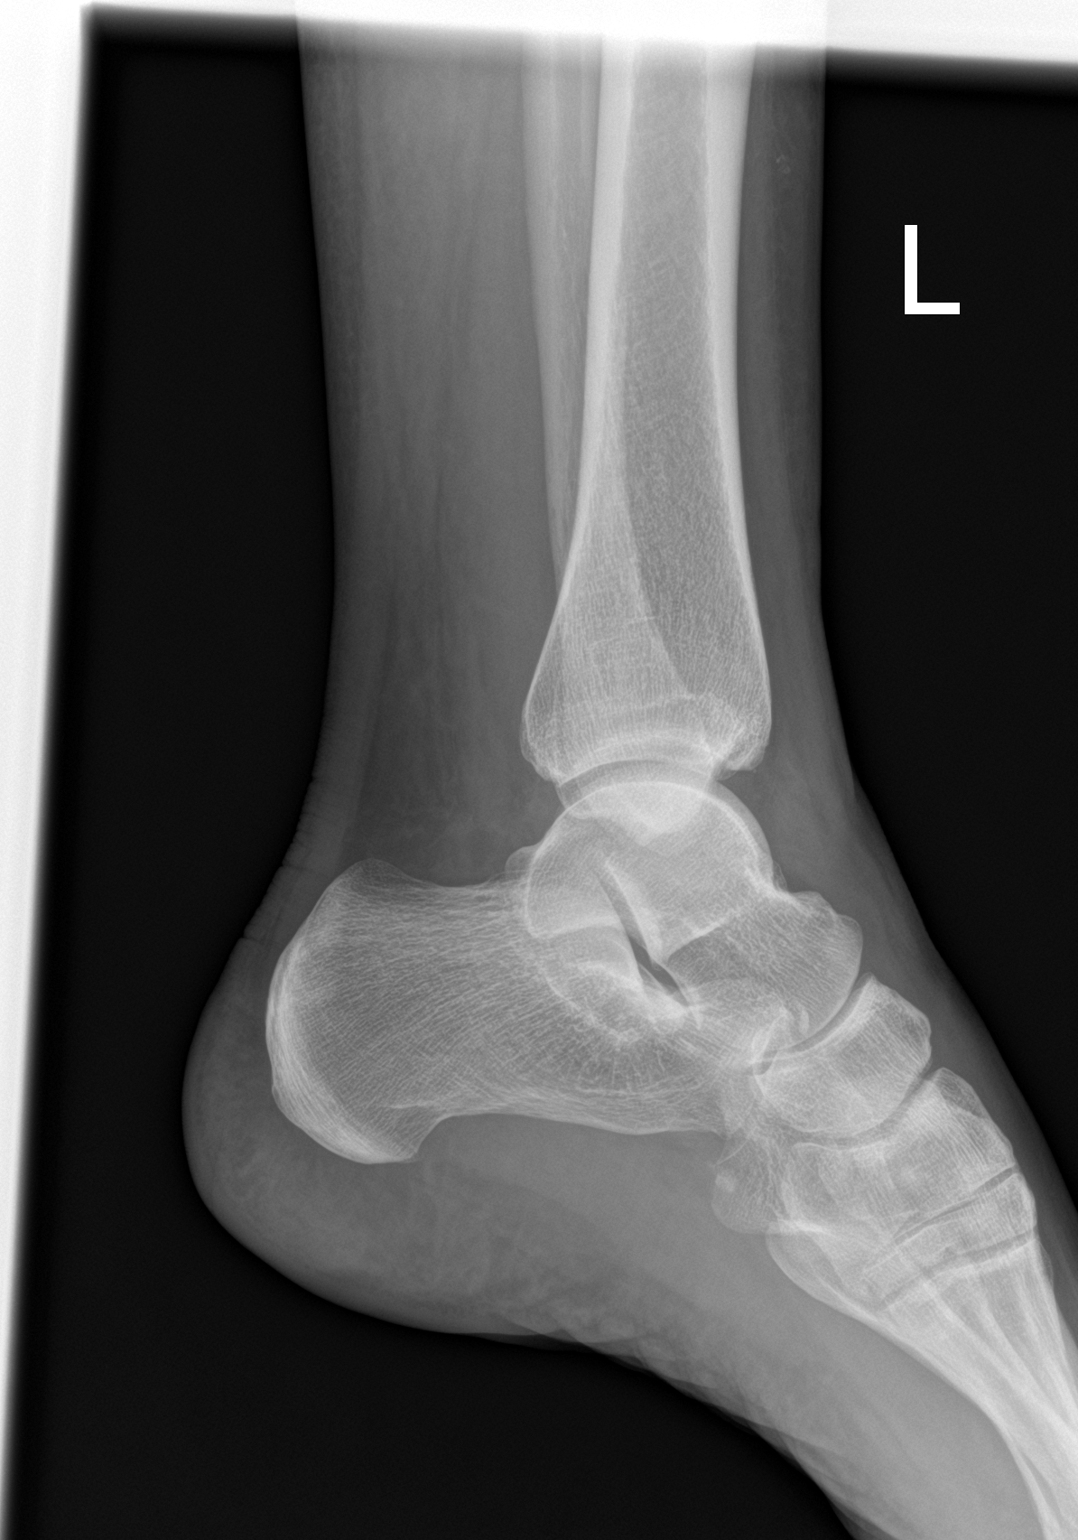

[3 of 3 positions shown; findings below may reference images not displayed]

FINDINGS: There is no evidence of fracture, dislocation, or joint effusion.
There is no evidence of arthropathy or other focal bone abnormality.
Soft tissues are unremarkable.
IMPRESSION: Negative.

## 2024-02-08 ENCOUNTER — Encounter

## 2024-02-08 ENCOUNTER — Inpatient Hospital Stay: Admit: 2024-02-08 | Payer: BLUE CROSS/BLUE SHIELD | Primary: Family Medicine

## 2024-03-25 ENCOUNTER — Ambulatory Visit: Payer: PRIVATE HEALTH INSURANCE

## 2024-05-24 ENCOUNTER — Other Ambulatory Visit: Payer: Self-pay | Admitting: Physician Assistant

## 2024-05-24 DIAGNOSIS — K581 Irritable bowel syndrome with constipation: Secondary | ICD-10-CM

## 2024-05-28 ENCOUNTER — Other Ambulatory Visit: Payer: Self-pay | Admitting: Physician Assistant

## 2024-05-28 DIAGNOSIS — E782 Mixed hyperlipidemia: Secondary | ICD-10-CM

## 2024-05-31 ENCOUNTER — Other Ambulatory Visit: Payer: Self-pay | Admitting: Physician Assistant

## 2024-05-31 DIAGNOSIS — K219 Gastro-esophageal reflux disease without esophagitis: Secondary | ICD-10-CM

## 2024-06-05 ENCOUNTER — Other Ambulatory Visit: Payer: Self-pay

## 2024-06-05 MED ORDER — FLUCONAZOLE 150 MG PO TABS: ORAL_TABLET | ORAL | 0 refills | Status: AC

## 2024-06-05 NOTE — Telephone Encounter (Signed)
 Pt called she is having yeast infection as per dr fernand sent diflucan 

## 2024-07-31 ENCOUNTER — Ambulatory Visit: Payer: PRIVATE HEALTH INSURANCE

## 2024-09-05 ENCOUNTER — Encounter: Admitting: Physician Assistant
